# Patient Record
Sex: Female | Born: 1950 | Race: Black or African American | Hispanic: No | Marital: Single | Smoking: Former smoker
Health system: Southern US, Community
[De-identification: ages and names within clinical notes are randomized; demographics above are authoritative.]

## PROBLEM LIST (undated history)

## (undated) DIAGNOSIS — F419 Anxiety disorder, unspecified: Secondary | ICD-10-CM

## (undated) DIAGNOSIS — M1990 Unspecified osteoarthritis, unspecified site: Secondary | ICD-10-CM

## (undated) DIAGNOSIS — K219 Gastro-esophageal reflux disease without esophagitis: Secondary | ICD-10-CM

## (undated) DIAGNOSIS — E119 Type 2 diabetes mellitus without complications: Secondary | ICD-10-CM

## (undated) DIAGNOSIS — R51 Headache: Secondary | ICD-10-CM

## (undated) DIAGNOSIS — I8290 Acute embolism and thrombosis of unspecified vein: Secondary | ICD-10-CM

## (undated) DIAGNOSIS — G629 Polyneuropathy, unspecified: Secondary | ICD-10-CM

## (undated) DIAGNOSIS — Z8719 Personal history of other diseases of the digestive system: Secondary | ICD-10-CM

## (undated) DIAGNOSIS — I499 Cardiac arrhythmia, unspecified: Secondary | ICD-10-CM

## (undated) DIAGNOSIS — C801 Malignant (primary) neoplasm, unspecified: Secondary | ICD-10-CM

## (undated) DIAGNOSIS — I10 Essential (primary) hypertension: Secondary | ICD-10-CM

## (undated) DIAGNOSIS — R519 Headache, unspecified: Secondary | ICD-10-CM

## (undated) DIAGNOSIS — K649 Unspecified hemorrhoids: Secondary | ICD-10-CM

## (undated) DIAGNOSIS — K5900 Constipation, unspecified: Secondary | ICD-10-CM

## (undated) DIAGNOSIS — D649 Anemia, unspecified: Secondary | ICD-10-CM

## (undated) DIAGNOSIS — E039 Hypothyroidism, unspecified: Secondary | ICD-10-CM

## (undated) HISTORY — DX: Polyneuropathy, unspecified: G62.9

## (undated) HISTORY — PX: OTHER SURGICAL HISTORY: SHX169

## (undated) HISTORY — PX: BACK SURGERY: SHX140

## (undated) HISTORY — PX: ABDOMINAL HYSTERECTOMY: SHX81

## (undated) HISTORY — PX: NASAL SINUS SURGERY: SHX719

## (undated) HISTORY — PX: COLONOSCOPY: SHX174

## (undated) HISTORY — PX: FOOT SURGERY: SHX648

## (undated) HISTORY — PX: CERVIX REMOVAL: SHX592

## (undated) HISTORY — PX: TONSILLECTOMY: SUR1361

---

## 2013-10-22 DIAGNOSIS — M797 Fibromyalgia: Secondary | ICD-10-CM

## 2013-10-22 DIAGNOSIS — M069 Rheumatoid arthritis, unspecified: Secondary | ICD-10-CM

## 2013-10-22 HISTORY — DX: Fibromyalgia: M79.7

## 2013-10-22 HISTORY — DX: Rheumatoid arthritis, unspecified: M06.9

## 2015-10-23 DIAGNOSIS — M722 Plantar fascial fibromatosis: Secondary | ICD-10-CM

## 2015-10-23 HISTORY — DX: Plantar fascial fibromatosis: M72.2

## 2015-11-24 ENCOUNTER — Other Ambulatory Visit: Payer: Self-pay | Admitting: Neurosurgery

## 2015-11-24 DIAGNOSIS — M4316 Spondylolisthesis, lumbar region: Secondary | ICD-10-CM

## 2015-12-02 ENCOUNTER — Other Ambulatory Visit: Payer: Self-pay

## 2015-12-06 ENCOUNTER — Inpatient Hospital Stay: Admission: RE | Admit: 2015-12-06 | Payer: Self-pay | Source: Ambulatory Visit

## 2015-12-06 ENCOUNTER — Other Ambulatory Visit: Payer: Self-pay | Admitting: Neurosurgery

## 2015-12-06 DIAGNOSIS — M4316 Spondylolisthesis, lumbar region: Secondary | ICD-10-CM

## 2015-12-09 ENCOUNTER — Ambulatory Visit
Admission: RE | Admit: 2015-12-09 | Discharge: 2015-12-09 | Disposition: A | Discharge status: Home / Self Care | Payer: BLUE CROSS/BLUE SHIELD | Source: Ambulatory Visit | Attending: Neurosurgery | Admitting: Neurosurgery

## 2015-12-09 DIAGNOSIS — M4316 Spondylolisthesis, lumbar region: Secondary | ICD-10-CM

## 2015-12-09 MED ORDER — METHYLPREDNISOLONE ACETATE 40 MG/ML INJ SUSP (RADIOLOG
120.0000 mg | Freq: Once | INTRAMUSCULAR | Status: AC
  Administered 2015-12-09: 120 mg via EPIDURAL

## 2015-12-09 MED ORDER — IOHEXOL 180 MG/ML  SOLN
1.0000 mL | Freq: Once | INTRAMUSCULAR | Status: AC | PRN
  Administered 2015-12-09: 1 mL via EPIDURAL

## 2015-12-09 NOTE — Discharge Instructions (Signed)

## 2015-12-21 ENCOUNTER — Other Ambulatory Visit: Payer: Self-pay | Admitting: Neurosurgery

## 2015-12-21 DIAGNOSIS — M4316 Spondylolisthesis, lumbar region: Secondary | ICD-10-CM

## 2015-12-27 ENCOUNTER — Ambulatory Visit
Admission: RE | Admit: 2015-12-27 | Discharge: 2015-12-27 | Disposition: A | Discharge status: Home / Self Care | Payer: Medicare Other | Source: Ambulatory Visit | Attending: Neurosurgery | Admitting: Neurosurgery

## 2015-12-27 DIAGNOSIS — M4316 Spondylolisthesis, lumbar region: Secondary | ICD-10-CM

## 2015-12-27 MED ORDER — METHYLPREDNISOLONE ACETATE 40 MG/ML INJ SUSP (RADIOLOG
120.0000 mg | Freq: Once | INTRAMUSCULAR | Status: AC
  Administered 2015-12-27: 120 mg via EPIDURAL

## 2015-12-27 MED ORDER — IOHEXOL 180 MG/ML  SOLN
1.0000 mL | Freq: Once | INTRAMUSCULAR | Status: AC | PRN
  Administered 2015-12-27: 1 mL via EPIDURAL

## 2015-12-27 NOTE — Discharge Instructions (Signed)

## 2016-01-11 ENCOUNTER — Other Ambulatory Visit: Payer: Self-pay | Admitting: Neurosurgery

## 2016-01-11 DIAGNOSIS — M4316 Spondylolisthesis, lumbar region: Secondary | ICD-10-CM

## 2016-01-16 ENCOUNTER — Other Ambulatory Visit: Payer: BLUE CROSS/BLUE SHIELD

## 2016-01-30 ENCOUNTER — Ambulatory Visit
Admission: RE | Admit: 2016-01-30 | Discharge: 2016-01-30 | Disposition: A | Discharge status: Home / Self Care | Payer: BLUE CROSS/BLUE SHIELD | Source: Ambulatory Visit | Attending: Neurosurgery | Admitting: Neurosurgery

## 2016-01-30 ENCOUNTER — Ambulatory Visit
Admission: RE | Admit: 2016-01-30 | Discharge: 2016-01-30 | Disposition: A | Discharge status: Home / Self Care | Payer: Medicare Other | Source: Ambulatory Visit | Attending: Neurosurgery | Admitting: Neurosurgery

## 2016-01-30 DIAGNOSIS — M4316 Spondylolisthesis, lumbar region: Secondary | ICD-10-CM

## 2016-01-30 MED ORDER — ONDANSETRON HCL 4 MG/2ML IJ SOLN
4.0000 mg | Freq: Once | INTRAMUSCULAR | Status: AC
  Administered 2016-01-30: 4 mg via INTRAMUSCULAR

## 2016-01-30 MED ORDER — DIAZEPAM 5 MG PO TABS
5.0000 mg | ORAL_TABLET | Freq: Once | ORAL | Status: AC
  Administered 2016-01-30: 5 mg via ORAL

## 2016-01-30 MED ORDER — IOPAMIDOL (ISOVUE-M 200) INJECTION 41%
15.0000 mL | Freq: Once | INTRAMUSCULAR | Status: AC
  Administered 2016-01-30: 15 mL via INTRATHECAL

## 2016-01-30 MED ORDER — ONDANSETRON HCL 4 MG/2ML IJ SOLN: 4.0000 mg | Freq: Four times a day (QID) | INTRAMUSCULAR | Status: DC | PRN

## 2016-01-30 MED ORDER — MEPERIDINE HCL 100 MG/ML IJ SOLN
75.0000 mg | Freq: Once | INTRAMUSCULAR | Status: AC
  Administered 2016-01-30: 75 mg via INTRAMUSCULAR

## 2016-01-30 NOTE — Discharge Instructions (Signed)

## 2016-03-06 ENCOUNTER — Other Ambulatory Visit: Payer: Self-pay | Admitting: Neurological Surgery

## 2016-03-16 NOTE — Pre-Procedure Instructions (Signed)
    Jalyiah Woodrick  03/16/2016   Your procedure is scheduled on Tuesday, June 6.  Report to [facility] Admitting at 5:30 A.M.               Your surgery or procedure is scheduled for 7:30 AM   Call this number if you have problems the morning of [phone]                 For any other questions, please call [phone], Monday - Friday 8 AM - 4 PM.   Remember:  Do not eat food or drink liquids after midnight Monday June 5.  Take these medicines the morning of surgery with A SIP OF WATER: CRESTOR, gabapentin (NEURONTIN).                Take if needed: HYDROcodone-acetaminophen (NORCO/VICODIN).                   STOP taking Meloxicam (Mobic), Herbal Medications, Vitamins.  Do not take Aspirin or Aspirin Products , Ibuprofen (Advil), Naproxen (Aleve).   Do not wear jewelry, make-up or nail polish.  Do not wear lotions, powders, or perfumes.    Do not shave 48 hours prior to surgery.    Do not bring valuables to the hospital.  [facility] is not responsible for any belongings or valuables.  Contacts, dentures or bridgework may not be worn into surgery.  Leave your suitcase in the car.  After surgery it may be brought to your room.  For patients admitted to the hospital, discharge time will be determined by your treatment team.   Please read over the following fact sheets that you were given. [facility]- Preparing For Surgery and Patient Instructions for Mupirocin Application

## 2016-03-20 ENCOUNTER — Encounter (HOSPITAL_COMMUNITY)
Admission: RE | Admit: 2016-03-20 | Discharge: 2016-03-20 | Disposition: A | Discharge status: Home / Self Care | Payer: BLUE CROSS/BLUE SHIELD | Source: Ambulatory Visit | Attending: Neurological Surgery | Admitting: Neurological Surgery

## 2016-03-20 ENCOUNTER — Encounter (HOSPITAL_COMMUNITY): Payer: Self-pay

## 2016-03-20 DIAGNOSIS — Z01812 Encounter for preprocedural laboratory examination: Secondary | ICD-10-CM | POA: Insufficient documentation

## 2016-03-20 DIAGNOSIS — Z01818 Encounter for other preprocedural examination: Secondary | ICD-10-CM | POA: Diagnosis present

## 2016-03-20 DIAGNOSIS — I10 Essential (primary) hypertension: Secondary | ICD-10-CM | POA: Diagnosis not present

## 2016-03-20 DIAGNOSIS — Z0183 Encounter for blood typing: Secondary | ICD-10-CM | POA: Diagnosis not present

## 2016-03-20 DIAGNOSIS — M4316 Spondylolisthesis, lumbar region: Secondary | ICD-10-CM | POA: Diagnosis not present

## 2016-03-20 HISTORY — DX: Type 2 diabetes mellitus without complications: E11.9

## 2016-03-20 HISTORY — DX: Unspecified osteoarthritis, unspecified site: M19.90

## 2016-03-20 HISTORY — DX: Unspecified hemorrhoids: K64.9

## 2016-03-20 HISTORY — DX: Personal history of other diseases of the digestive system: Z87.19

## 2016-03-20 HISTORY — DX: Anemia, unspecified: D64.9

## 2016-03-20 HISTORY — DX: Essential (primary) hypertension: I10

## 2016-03-20 HISTORY — DX: Cardiac arrhythmia, unspecified: I49.9

## 2016-03-20 HISTORY — DX: Gastro-esophageal reflux disease without esophagitis: K21.9

## 2016-03-20 HISTORY — DX: Hypothyroidism, unspecified: E03.9

## 2016-03-20 HISTORY — DX: Constipation, unspecified: K59.00

## 2016-03-20 LAB — CBC
HCT: 43.5 % (ref 36.0–46.0)
Hemoglobin: 13.8 g/dL (ref 12.0–15.0)
MCH: 27.8 pg (ref 26.0–34.0)
MCHC: 31.7 g/dL (ref 30.0–36.0)
MCV: 87.7 fL (ref 78.0–100.0)
Platelets: 251 10*3/uL (ref 150–400)
RBC: 4.96 MIL/uL (ref 3.87–5.11)
RDW: 14.2 % (ref 11.5–15.5)
WBC: 6.2 10*3/uL (ref 4.0–10.5)

## 2016-03-20 LAB — BASIC METABOLIC PANEL
Anion gap: 6 (ref 5–15)
BUN: 13 mg/dL (ref 6–20)
CO2: 26 mmol/L (ref 22–32)
Calcium: 9.4 mg/dL (ref 8.9–10.3)
Chloride: 106 mmol/L (ref 101–111)
Creatinine, Ser: 0.9 mg/dL (ref 0.44–1.00)
GFR calc Af Amer: >60 mL/min (ref >60)
GFR calc non Af Amer: >60 mL/min (ref >60)
Glucose, Bld: 102 mg/dL — ABNORMAL HIGH (ref 65–99)
Potassium: 3.7 mmol/L (ref 3.5–5.1)
Sodium: 138 mmol/L (ref 135–145)

## 2016-03-20 LAB — TYPE AND SCREEN
ABO/RH(D): A POS
Antibody Screen: NEGATIVE

## 2016-03-20 LAB — SURGICAL PCR SCREEN
MRSA, PCR: NEGATIVE
Staphylococcus aureus: NEGATIVE

## 2016-03-20 LAB — ABO/RH: ABO/RH(D): A POS

## 2016-03-20 LAB — GLUCOSE, CAPILLARY: Glucose-Capillary: 102 mg/dL — ABNORMAL HIGH (ref 65–99)

## 2016-03-20 NOTE — Progress Notes (Signed)
Sheri Baker reported that she is a type II diabetic. Patient states that she does not check CBGs, does not have a machine.  "Sheri Baker had put me on Metformin and I stopped it."  "I started it back 3 days prior to seeing Sheri Baker, he told me to stop it for my bones." PCP is not aware that patient is not taking medication."  Patient denies chest pain or shortness of breath.  Patient reports that she had a cardiac work up- ? 8 years ago, but everything was normal and she did not need to follow up with a cardiologist. I requester records from Sheri Baker- [facility] in [place].  I also requested records from [facility].

## 2016-03-21 LAB — HEMOGLOBIN A1C
Hgb A1c MFr Bld: 6.2 % — ABNORMAL HIGH (ref 4.8–5.6)
Mean Plasma Glucose: 131 mg/dL

## 2016-03-26 MED ORDER — VANCOMYCIN HCL IN DEXTROSE 1-5 GM/200ML-% IV SOLN
1000.0000 mg | INTRAVENOUS | Status: AC
Start: 2016-03-27 — End: 2016-03-27
  Administered 2016-03-27: 1000 mg via INTRAVENOUS
  Filled 2016-03-26: qty 200

## 2016-03-26 NOTE — Anesthesia Preprocedure Evaluation (Addendum)
Anesthesia Evaluation  Patient identified by MRN, date of birth, ID band Patient awake    Reviewed: Allergy & Precautions, NPO status , Patient's Chart, lab work & pertinent test results  History of Anesthesia Complications Negative for: history of anesthetic complications  Airway Mallampati: II  TM Distance: >3 FB Neck ROM: Full    Dental  (+) Chipped, Dental Advisory Given, Missing, Teeth Intact   Pulmonary former smoker,    breath sounds clear to auscultation       Cardiovascular hypertension, Pt. on medications (-) angina Rhythm:Regular Rate:Normal  '11 cath: normal coronaries, normal LVF   Neuro/Psych Chronic back pain: narcotics    GI/Hepatic Neg liver ROS, hiatal hernia, GERD  Medicated and Controlled,  Endo/Other  diabetes (diet controlledglu 109), Well Controlled, Type 2Hypothyroidism Morbid obesity  Renal/GU negative Renal ROS     Musculoskeletal  (+) Arthritis , Osteoarthritis,    Abdominal (+) + obese,   Peds  Hematology negative hematology ROS (+)   Anesthesia Other Findings   Reproductive/Obstetrics                         Anesthesia Physical Anesthesia Plan  ASA: III  Anesthesia Plan: General   Post-op Pain Management:    Induction: Intravenous  Airway Management Planned: Oral ETT  Additional Equipment:   Intra-op Plan:   Post-operative Plan: Extubation in OR  Informed Consent: I have reviewed the patients History and Physical, chart, labs and discussed the procedure including the risks, benefits and alternatives for the proposed anesthesia with the patient or authorized representative who has indicated his/her understanding and acceptance.   Dental advisory given  Plan Discussed with: CRNA, Surgeon and Anesthesiologist  Anesthesia Plan Comments: (Plan routine monitors, GETA)       Anesthesia Quick Evaluation

## 2016-03-27 ENCOUNTER — Encounter (HOSPITAL_COMMUNITY)
Admission: RE | Disposition: A | Discharge status: Rehabilitation Facility | Payer: Self-pay | Source: Ambulatory Visit | Attending: Neurological Surgery

## 2016-03-27 ENCOUNTER — Inpatient Hospital Stay (HOSPITAL_COMMUNITY)
Admission: RE | Admit: 2016-03-27 | Discharge: 2016-03-30 | DRG: 460 | Disposition: A | Discharge status: Rehabilitation Facility | Payer: BLUE CROSS/BLUE SHIELD | Source: Ambulatory Visit | Attending: Neurological Surgery | Admitting: Neurological Surgery

## 2016-03-27 ENCOUNTER — Inpatient Hospital Stay (HOSPITAL_COMMUNITY): Payer: BLUE CROSS/BLUE SHIELD | Admitting: Emergency Medicine

## 2016-03-27 ENCOUNTER — Encounter (HOSPITAL_COMMUNITY): Payer: Self-pay | Admitting: *Deleted

## 2016-03-27 ENCOUNTER — Inpatient Hospital Stay (HOSPITAL_COMMUNITY): Payer: BLUE CROSS/BLUE SHIELD | Admitting: Anesthesiology

## 2016-03-27 ENCOUNTER — Inpatient Hospital Stay (HOSPITAL_COMMUNITY): Payer: BLUE CROSS/BLUE SHIELD

## 2016-03-27 DIAGNOSIS — Z886 Allergy status to analgesic agent status: Secondary | ICD-10-CM

## 2016-03-27 DIAGNOSIS — K219 Gastro-esophageal reflux disease without esophagitis: Secondary | ICD-10-CM | POA: Diagnosis present

## 2016-03-27 DIAGNOSIS — Z885 Allergy status to narcotic agent status: Secondary | ICD-10-CM

## 2016-03-27 DIAGNOSIS — M4726 Other spondylosis with radiculopathy, lumbar region: Secondary | ICD-10-CM | POA: Diagnosis present

## 2016-03-27 DIAGNOSIS — E039 Hypothyroidism, unspecified: Secondary | ICD-10-CM | POA: Diagnosis present

## 2016-03-27 DIAGNOSIS — D62 Acute posthemorrhagic anemia: Secondary | ICD-10-CM | POA: Diagnosis not present

## 2016-03-27 DIAGNOSIS — M4806 Spinal stenosis, lumbar region: Secondary | ICD-10-CM | POA: Diagnosis present

## 2016-03-27 DIAGNOSIS — K449 Diaphragmatic hernia without obstruction or gangrene: Secondary | ICD-10-CM | POA: Diagnosis present

## 2016-03-27 DIAGNOSIS — Z419 Encounter for procedure for purposes other than remedying health state, unspecified: Secondary | ICD-10-CM

## 2016-03-27 DIAGNOSIS — G8929 Other chronic pain: Secondary | ICD-10-CM | POA: Diagnosis present

## 2016-03-27 DIAGNOSIS — Z87891 Personal history of nicotine dependence: Secondary | ICD-10-CM | POA: Diagnosis not present

## 2016-03-27 DIAGNOSIS — K5901 Slow transit constipation: Secondary | ICD-10-CM | POA: Diagnosis present

## 2016-03-27 DIAGNOSIS — I10 Essential (primary) hypertension: Secondary | ICD-10-CM | POA: Diagnosis present

## 2016-03-27 DIAGNOSIS — Z79899 Other long term (current) drug therapy: Secondary | ICD-10-CM | POA: Diagnosis not present

## 2016-03-27 DIAGNOSIS — E871 Hypo-osmolality and hyponatremia: Secondary | ICD-10-CM | POA: Diagnosis present

## 2016-03-27 DIAGNOSIS — Z23 Encounter for immunization: Secondary | ICD-10-CM | POA: Diagnosis not present

## 2016-03-27 DIAGNOSIS — R2689 Other abnormalities of gait and mobility: Secondary | ICD-10-CM | POA: Diagnosis present

## 2016-03-27 DIAGNOSIS — Z6841 Body Mass Index (BMI) 40.0 and over, adult: Secondary | ICD-10-CM | POA: Diagnosis not present

## 2016-03-27 DIAGNOSIS — M5416 Radiculopathy, lumbar region: Secondary | ICD-10-CM | POA: Diagnosis not present

## 2016-03-27 DIAGNOSIS — M4807 Spinal stenosis, lumbosacral region: Secondary | ICD-10-CM | POA: Diagnosis present

## 2016-03-27 DIAGNOSIS — R079 Chest pain, unspecified: Secondary | ICD-10-CM | POA: Diagnosis not present

## 2016-03-27 DIAGNOSIS — I2699 Other pulmonary embolism without acute cor pulmonale: Secondary | ICD-10-CM | POA: Diagnosis present

## 2016-03-27 DIAGNOSIS — T402X5A Adverse effect of other opioids, initial encounter: Secondary | ICD-10-CM | POA: Diagnosis not present

## 2016-03-27 DIAGNOSIS — F419 Anxiety disorder, unspecified: Secondary | ICD-10-CM | POA: Diagnosis present

## 2016-03-27 DIAGNOSIS — Z880 Allergy status to penicillin: Secondary | ICD-10-CM | POA: Diagnosis not present

## 2016-03-27 DIAGNOSIS — E119 Type 2 diabetes mellitus without complications: Secondary | ICD-10-CM | POA: Diagnosis present

## 2016-03-27 DIAGNOSIS — R002 Palpitations: Secondary | ICD-10-CM | POA: Diagnosis not present

## 2016-03-27 DIAGNOSIS — G5791 Unspecified mononeuropathy of right lower limb: Secondary | ICD-10-CM | POA: Diagnosis not present

## 2016-03-27 DIAGNOSIS — R071 Chest pain on breathing: Secondary | ICD-10-CM | POA: Diagnosis not present

## 2016-03-27 DIAGNOSIS — D72829 Elevated white blood cell count, unspecified: Secondary | ICD-10-CM | POA: Diagnosis present

## 2016-03-27 DIAGNOSIS — D696 Thrombocytopenia, unspecified: Secondary | ICD-10-CM | POA: Diagnosis not present

## 2016-03-27 DIAGNOSIS — M4316 Spondylolisthesis, lumbar region: Secondary | ICD-10-CM | POA: Diagnosis present

## 2016-03-27 DIAGNOSIS — R52 Pain, unspecified: Secondary | ICD-10-CM | POA: Diagnosis not present

## 2016-03-27 DIAGNOSIS — F411 Generalized anxiety disorder: Secondary | ICD-10-CM | POA: Diagnosis not present

## 2016-03-27 HISTORY — DX: Headache, unspecified: R51.9

## 2016-03-27 HISTORY — DX: Headache: R51

## 2016-03-27 LAB — GLUCOSE, CAPILLARY
Glucose-Capillary: 109 mg/dL — ABNORMAL HIGH (ref 65–99)
Glucose-Capillary: 141 mg/dL — ABNORMAL HIGH (ref 65–99)
Glucose-Capillary: 134 mg/dL — ABNORMAL HIGH (ref 65–99)
Glucose-Capillary: 154 mg/dL — ABNORMAL HIGH (ref 65–99)

## 2016-03-27 SURGERY — POSTERIOR LUMBAR FUSION 2 LEVEL
Anesthesia: General | Site: Spine Lumbar

## 2016-03-27 MED ORDER — HYDROMORPHONE HCL 1 MG/ML IJ SOLN
INTRAMUSCULAR | Status: AC
  Filled 2016-03-27: qty 1

## 2016-03-27 MED ORDER — ALUM & MAG HYDROXIDE-SIMETH 200-200-20 MG/5ML PO SUSP: 30.0000 mL | Freq: Four times a day (QID) | ORAL | Status: DC | PRN

## 2016-03-27 MED ORDER — SODIUM CHLORIDE 0.9 % IV SOLN
INTRAVENOUS | Status: DC | PRN
  Administered 2016-03-27: 14:00:00 via INTRAVENOUS

## 2016-03-27 MED ORDER — ACETAMINOPHEN 325 MG PO TABS
650.0000 mg | ORAL_TABLET | ORAL | Status: DC | PRN
  Filled 2016-03-27: qty 2

## 2016-03-27 MED ORDER — POLYETHYLENE GLYCOL 3350 17 G PO PACK: 17.0000 g | PACK | Freq: Every day | ORAL | Status: DC | PRN

## 2016-03-27 MED ORDER — SPIRONOLACTONE 25 MG PO TABS
25.0000 mg | ORAL_TABLET | Freq: Every day | ORAL | Status: DC
  Administered 2016-03-28: 25 mg via ORAL
  Filled 2016-03-27 (×3): qty 1

## 2016-03-27 MED ORDER — MIDAZOLAM HCL 5 MG/5ML IJ SOLN
INTRAMUSCULAR | Status: DC | PRN
  Administered 2016-03-27: 2 mg via INTRAVENOUS

## 2016-03-27 MED ORDER — SODIUM CHLORIDE 0.9 % IV SOLN
INTRAVENOUS | Status: DC
  Administered 2016-03-27 – 2016-03-28 (×2): via INTRAVENOUS

## 2016-03-27 MED ORDER — ALBUMIN HUMAN 5 % IV SOLN
INTRAVENOUS | Status: DC | PRN
Start: 2016-03-27 — End: 2016-03-27
  Administered 2016-03-27: 12:00:00 via INTRAVENOUS

## 2016-03-27 MED ORDER — LIDOCAINE-EPINEPHRINE 1 %-1:100000 IJ SOLN
INTRAMUSCULAR | Status: DC | PRN
  Administered 2016-03-27: 5 mL

## 2016-03-27 MED ORDER — THYROID 30 MG PO TABS: 15.0000 mg | ORAL_TABLET | Freq: Every day | ORAL | Status: DC

## 2016-03-27 MED ORDER — SODIUM CHLORIDE 0.9 % IR SOLN
Status: DC | PRN
  Administered 2016-03-27: 500 mL

## 2016-03-27 MED ORDER — ROCURONIUM BROMIDE 100 MG/10ML IV SOLN
INTRAVENOUS | Status: DC | PRN
  Administered 2016-03-27: 50 mg via INTRAVENOUS
  Administered 2016-03-27: 10 mg via INTRAVENOUS
  Administered 2016-03-27: 20 mg via INTRAVENOUS
  Administered 2016-03-27 (×5): 10 mg via INTRAVENOUS

## 2016-03-27 MED ORDER — THYROID 16.25 MG PO TABS: 16.2500 mg | ORAL_TABLET | Freq: Every day | ORAL | Status: DC

## 2016-03-27 MED ORDER — SODIUM CHLORIDE 0.9 % IV SOLN
250.0000 mL | INTRAVENOUS | Status: DC
  Administered 2016-03-27: 250 mL via INTRAVENOUS

## 2016-03-27 MED ORDER — MAGNESIUM CITRATE PO SOLN: 1.0000 | Freq: Once | ORAL | Status: DC | PRN

## 2016-03-27 MED ORDER — OMEPRAZOLE MAGNESIUM 20 MG PO TBEC: 20.0000 mg | DELAYED_RELEASE_TABLET | Freq: Every day | ORAL | Status: DC

## 2016-03-27 MED ORDER — KETOROLAC TROMETHAMINE 15 MG/ML IJ SOLN
15.0000 mg | Freq: Four times a day (QID) | INTRAMUSCULAR | Status: AC
  Administered 2016-03-27 – 2016-03-28 (×4): 15 mg via INTRAVENOUS
  Filled 2016-03-27 (×4): qty 1

## 2016-03-27 MED ORDER — BUPIVACAINE HCL (PF) 0.5 % IJ SOLN
INTRAMUSCULAR | Status: DC | PRN
  Administered 2016-03-27: 22 mL
  Administered 2016-03-27: 5 mL

## 2016-03-27 MED ORDER — MORPHINE SULFATE (PF) 2 MG/ML IV SOLN
1.0000 mg | INTRAVENOUS | Status: DC | PRN
  Administered 2016-03-27 – 2016-03-28 (×3): 2 mg via INTRAVENOUS
  Administered 2016-03-28: 4 mg via INTRAVENOUS
  Administered 2016-03-28: 3 mg via INTRAVENOUS
  Filled 2016-03-27: qty 2
  Filled 2016-03-27 (×3): qty 1
  Filled 2016-03-27: qty 2
  Filled 2016-03-27: qty 1

## 2016-03-27 MED ORDER — MIDAZOLAM HCL 2 MG/2ML IJ SOLN: 0.5000 mg | Freq: Once | INTRAMUSCULAR | Status: DC | PRN

## 2016-03-27 MED ORDER — HYDROCHLOROTHIAZIDE 25 MG PO TABS
25.0000 mg | ORAL_TABLET | Freq: Every day | ORAL | Status: DC
  Administered 2016-03-28: 25 mg via ORAL
  Filled 2016-03-27 (×3): qty 1

## 2016-03-27 MED ORDER — MEPERIDINE HCL 25 MG/ML IJ SOLN: 6.2500 mg | INTRAMUSCULAR | Status: DC | PRN

## 2016-03-27 MED ORDER — SODIUM CHLORIDE 0.9% FLUSH
3.0000 mL | Freq: Two times a day (BID) | INTRAVENOUS | Status: DC
  Administered 2016-03-27 – 2016-03-29 (×4): 3 mL via INTRAVENOUS

## 2016-03-27 MED ORDER — 0.9 % SODIUM CHLORIDE (POUR BTL) OPTIME
TOPICAL | Status: DC | PRN
  Administered 2016-03-27: 1000 mL

## 2016-03-27 MED ORDER — HYDROCODONE-ACETAMINOPHEN 5-325 MG PO TABS: 1.0000 | ORAL_TABLET | ORAL | Status: DC | PRN

## 2016-03-27 MED ORDER — MENTHOL 3 MG MT LOZG
1.0000 | LOZENGE | OROMUCOSAL | Status: DC | PRN
Start: 2016-03-27 — End: 2016-03-30

## 2016-03-27 MED ORDER — PANTOPRAZOLE SODIUM 40 MG PO TBEC
40.0000 mg | DELAYED_RELEASE_TABLET | Freq: Every day | ORAL | Status: DC
  Administered 2016-03-27 – 2016-03-29 (×3): 40 mg via ORAL
  Filled 2016-03-27 (×4): qty 1

## 2016-03-27 MED ORDER — HYDROMORPHONE HCL 1 MG/ML IJ SOLN
0.2500 mg | INTRAMUSCULAR | Status: DC | PRN
Start: 2016-03-27 — End: 2016-03-27
  Administered 2016-03-27 (×4): 0.5 mg via INTRAVENOUS

## 2016-03-27 MED ORDER — PHENOL 1.4 % MT LIQD
1.0000 | OROMUCOSAL | Status: DC | PRN
Start: 2016-03-27 — End: 2016-03-30
  Filled 2016-03-27: qty 177

## 2016-03-27 MED ORDER — THROMBIN 5000 UNITS EX SOLR
CUTANEOUS | Status: DC | PRN
  Administered 2016-03-27 (×3): 5 mL via TOPICAL

## 2016-03-27 MED ORDER — HYDROMORPHONE HCL 1 MG/ML IJ SOLN
INTRAMUSCULAR | Status: AC
Start: 2016-03-27 — End: 2016-03-28
  Filled 2016-03-27: qty 1

## 2016-03-27 MED ORDER — THROMBIN 20000 UNITS EX SOLR
CUTANEOUS | Status: DC | PRN
  Administered 2016-03-27: 20 mL via TOPICAL

## 2016-03-27 MED ORDER — SUGAMMADEX SODIUM 200 MG/2ML IV SOLN
INTRAVENOUS | Status: DC | PRN
  Administered 2016-03-27: 200 mg via INTRAVENOUS

## 2016-03-27 MED ORDER — CEFAZOLIN SODIUM 1-5 GM-% IV SOLN: 1.0000 g | Freq: Three times a day (TID) | INTRAVENOUS | Status: DC

## 2016-03-27 MED ORDER — FENTANYL CITRATE (PF) 250 MCG/5ML IJ SOLN
INTRAMUSCULAR | Status: AC
  Filled 2016-03-27: qty 5

## 2016-03-27 MED ORDER — SENNA 8.6 MG PO TABS
1.0000 | ORAL_TABLET | Freq: Two times a day (BID) | ORAL | Status: DC
  Administered 2016-03-27 – 2016-03-30 (×6): 8.6 mg via ORAL
  Filled 2016-03-27 (×6): qty 1

## 2016-03-27 MED ORDER — HYDROCODONE-ACETAMINOPHEN 5-325 MG PO TABS
1.0000 | ORAL_TABLET | Freq: Four times a day (QID) | ORAL | Status: DC | PRN
  Administered 2016-03-27 – 2016-03-30 (×3): 1 via ORAL
  Filled 2016-03-27 (×3): qty 1

## 2016-03-27 MED ORDER — DEXAMETHASONE SODIUM PHOSPHATE 10 MG/ML IJ SOLN
INTRAMUSCULAR | Status: DC | PRN
  Administered 2016-03-27: 10 mg via INTRAVENOUS

## 2016-03-27 MED ORDER — DOCUSATE SODIUM 100 MG PO CAPS
100.0000 mg | ORAL_CAPSULE | Freq: Two times a day (BID) | ORAL | Status: DC
  Administered 2016-03-27 – 2016-03-30 (×6): 100 mg via ORAL
  Filled 2016-03-27 (×6): qty 1

## 2016-03-27 MED ORDER — LACTATED RINGERS IV SOLN
INTRAVENOUS | Status: DC | PRN
  Administered 2016-03-27 (×4): via INTRAVENOUS

## 2016-03-27 MED ORDER — ACETAMINOPHEN 650 MG RE SUPP: 650.0000 mg | RECTAL | Status: DC | PRN

## 2016-03-27 MED ORDER — ROSUVASTATIN CALCIUM 20 MG PO TABS
20.0000 mg | ORAL_TABLET | Freq: Every day | ORAL | Status: DC
  Administered 2016-03-27 – 2016-03-29 (×3): 20 mg via ORAL
  Filled 2016-03-27 (×4): qty 1

## 2016-03-27 MED ORDER — MIDAZOLAM HCL 2 MG/2ML IJ SOLN
INTRAMUSCULAR | Status: AC
  Filled 2016-03-27: qty 2

## 2016-03-27 MED ORDER — PROMETHAZINE HCL 25 MG/ML IJ SOLN: 6.2500 mg | INTRAMUSCULAR | Status: DC | PRN

## 2016-03-27 MED ORDER — ONDANSETRON HCL 4 MG/2ML IJ SOLN
INTRAMUSCULAR | Status: DC | PRN
  Administered 2016-03-27: 4 mg via INTRAVENOUS

## 2016-03-27 MED ORDER — PNEUMOCOCCAL VAC POLYVALENT 25 MCG/0.5ML IJ INJ
0.5000 mL | INJECTION | INTRAMUSCULAR | Status: AC
  Administered 2016-03-28: 0.5 mL via INTRAMUSCULAR
  Filled 2016-03-27: qty 0.5

## 2016-03-27 MED ORDER — METHOCARBAMOL 1000 MG/10ML IJ SOLN: 500.0000 mg | Freq: Four times a day (QID) | INTRAVENOUS | Status: DC | PRN

## 2016-03-27 MED ORDER — FENTANYL CITRATE (PF) 100 MCG/2ML IJ SOLN
INTRAMUSCULAR | Status: DC | PRN
  Administered 2016-03-27: 50 ug via INTRAVENOUS
  Administered 2016-03-27: 250 ug via INTRAVENOUS
  Administered 2016-03-27 (×6): 50 ug via INTRAVENOUS

## 2016-03-27 MED ORDER — LOSARTAN POTASSIUM 50 MG PO TABS
100.0000 mg | ORAL_TABLET | Freq: Every day | ORAL | Status: DC
  Administered 2016-03-28 – 2016-03-30 (×3): 100 mg via ORAL
  Filled 2016-03-27 (×4): qty 2

## 2016-03-27 MED ORDER — GABAPENTIN 300 MG PO CAPS
300.0000 mg | ORAL_CAPSULE | Freq: Three times a day (TID) | ORAL | Status: DC
  Administered 2016-03-27 – 2016-03-30 (×9): 300 mg via ORAL
  Filled 2016-03-27 (×10): qty 1

## 2016-03-27 MED ORDER — PROPOFOL 10 MG/ML IV BOLUS
INTRAVENOUS | Status: DC | PRN
  Administered 2016-03-27: 150 mg via INTRAVENOUS

## 2016-03-27 MED ORDER — BISACODYL 10 MG RE SUPP: 10.0000 mg | Freq: Every day | RECTAL | Status: DC | PRN

## 2016-03-27 MED ORDER — METHOCARBAMOL 500 MG PO TABS
500.0000 mg | ORAL_TABLET | Freq: Four times a day (QID) | ORAL | Status: DC | PRN
  Administered 2016-03-27 – 2016-03-30 (×5): 500 mg via ORAL
  Filled 2016-03-27 (×5): qty 1

## 2016-03-27 MED ORDER — SPIRONOLACTONE-HCTZ 25-25 MG PO TABS: 1.0000 | ORAL_TABLET | ORAL | Status: DC

## 2016-03-27 MED ORDER — PROPOFOL 10 MG/ML IV BOLUS
INTRAVENOUS | Status: AC
  Filled 2016-03-27: qty 20

## 2016-03-27 MED ORDER — BACLOFEN 10 MG PO TABS
10.0000 mg | ORAL_TABLET | Freq: Every day | ORAL | Status: DC | PRN
  Administered 2016-03-27: 10 mg via ORAL
  Filled 2016-03-27: qty 1

## 2016-03-27 MED ORDER — VANCOMYCIN HCL IN DEXTROSE 1-5 GM/200ML-% IV SOLN
1000.0000 mg | Freq: Two times a day (BID) | INTRAVENOUS | Status: AC
  Administered 2016-03-27 – 2016-03-28 (×2): 1000 mg via INTRAVENOUS
  Filled 2016-03-27 (×2): qty 200

## 2016-03-27 MED ORDER — ONDANSETRON HCL 4 MG/2ML IJ SOLN
4.0000 mg | INTRAMUSCULAR | Status: DC | PRN
  Administered 2016-03-27: 4 mg via INTRAVENOUS
  Filled 2016-03-27: qty 2

## 2016-03-27 MED ORDER — LIDOCAINE HCL (CARDIAC) 20 MG/ML IV SOLN
INTRAVENOUS | Status: DC | PRN
  Administered 2016-03-27: 20 mg via INTRAVENOUS

## 2016-03-27 MED ORDER — SODIUM CHLORIDE 0.9% FLUSH: 3.0000 mL | INTRAVENOUS | Status: DC | PRN

## 2016-03-27 MED ORDER — POTASSIUM GLUCONATE 595 (99 K) MG PO TABS: 1100.0000 mg | ORAL_TABLET | Freq: Every day | ORAL | Status: DC

## 2016-03-27 MED FILL — Sodium Chloride IV Soln 0.9%: INTRAVENOUS | Qty: 2000 | Status: AC

## 2016-03-27 MED FILL — Heparin Sodium (Porcine) Inj 1000 Unit/ML: INTRAMUSCULAR | Qty: 30 | Status: AC

## 2016-03-27 SURGICAL SUPPLY — 66 items
BAG DECANTER FOR FLEXI CONT (MISCELLANEOUS) ×2 IMPLANT
BLADE CLIPPER SURG (BLADE) IMPLANT
BUR MATCHSTICK NEURO 3.0 LAGG (BURR) ×2 IMPLANT
CANISTER SUCT 3000ML PPV (MISCELLANEOUS) ×2 IMPLANT
CONT SPEC 4OZ CLIKSEAL STRL BL (MISCELLANEOUS) ×2 IMPLANT
COVER BACK TABLE 60X90IN (DRAPES) ×2 IMPLANT
DECANTER SPIKE VIAL GLASS SM (MISCELLANEOUS) ×2 IMPLANT
DERMABOND ADHESIVE PROPEN (GAUZE/BANDAGES/DRESSINGS) ×1
DERMABOND ADVANCED (GAUZE/BANDAGES/DRESSINGS)
DERMABOND ADVANCED .7 DNX12 (GAUZE/BANDAGES/DRESSINGS) IMPLANT
DERMABOND ADVANCED .7 DNX6 (GAUZE/BANDAGES/DRESSINGS) ×1 IMPLANT
DEVICE DISSECT PLASMABLAD 3.0S (MISCELLANEOUS) ×1 IMPLANT
DRAPE C-ARM 42X72 X-RAY (DRAPES) ×4 IMPLANT
DRAPE LAPAROTOMY 100X72X124 (DRAPES) ×2 IMPLANT
DRAPE POUCH INSTRU U-SHP 10X18 (DRAPES) ×2 IMPLANT
DRAPE PROXIMA HALF (DRAPES) ×2 IMPLANT
DRSG OPSITE POSTOP 4X8 (GAUZE/BANDAGES/DRESSINGS) ×2 IMPLANT
DURAPREP 26ML APPLICATOR (WOUND CARE) ×2 IMPLANT
ELECT REM PT RETURN 9FT ADLT (ELECTROSURGICAL) ×2
ELECTRODE REM PT RTRN 9FT ADLT (ELECTROSURGICAL) ×1 IMPLANT
GAUZE SPONGE 4X4 12PLY STRL (GAUZE/BANDAGES/DRESSINGS) IMPLANT
GAUZE SPONGE 4X4 16PLY XRAY LF (GAUZE/BANDAGES/DRESSINGS) ×2 IMPLANT
GLOVE BIOGEL PI IND STRL 7.0 (GLOVE) ×3 IMPLANT
GLOVE BIOGEL PI IND STRL 8.5 (GLOVE) ×4 IMPLANT
GLOVE BIOGEL PI INDICATOR 7.0 (GLOVE) ×3
GLOVE BIOGEL PI INDICATOR 8.5 (GLOVE) ×4
GLOVE ECLIPSE 8.5 STRL (GLOVE) ×4 IMPLANT
GLOVE EXAM NITRILE LRG STRL (GLOVE) IMPLANT
GLOVE EXAM NITRILE MD LF STRL (GLOVE) IMPLANT
GLOVE EXAM NITRILE XL STR (GLOVE) IMPLANT
GLOVE EXAM NITRILE XS STR PU (GLOVE) IMPLANT
GLOVE SURG SS PI 6.5 STRL IVOR (GLOVE) ×10 IMPLANT
GOWN STRL REUS W/ TWL LRG LVL3 (GOWN DISPOSABLE) ×3 IMPLANT
GOWN STRL REUS W/ TWL XL LVL3 (GOWN DISPOSABLE) ×1 IMPLANT
GOWN STRL REUS W/TWL 2XL LVL3 (GOWN DISPOSABLE) ×4 IMPLANT
GOWN STRL REUS W/TWL LRG LVL3 (GOWN DISPOSABLE) ×3
GOWN STRL REUS W/TWL XL LVL3 (GOWN DISPOSABLE) ×1
HEMOSTAT POWDER KIT SURGIFOAM (HEMOSTASIS) ×4 IMPLANT
KIT BASIN OR (CUSTOM PROCEDURE TRAY) ×2 IMPLANT
KIT INFUSE MEDIUM (Orthopedic Implant) ×2 IMPLANT
KIT ROOM TURNOVER OR (KITS) ×2 IMPLANT
NEEDLE HYPO 22GX1.5 SAFETY (NEEDLE) ×2 IMPLANT
NEEDLE SPNL 18GX3.5 QUINCKE PK (NEEDLE) ×2 IMPLANT
NS IRRIG 1000ML POUR BTL (IV SOLUTION) ×2 IMPLANT
PACK LAMINECTOMY NEURO (CUSTOM PROCEDURE TRAY) ×2 IMPLANT
PAD ARMBOARD 7.5X6 YLW CONV (MISCELLANEOUS) ×10 IMPLANT
PATTIES SURGICAL .5 X1 (DISPOSABLE) ×2 IMPLANT
PLASMABLADE 3.0S (MISCELLANEOUS) ×2
ROD TI ALLOY CVD VIT 5.5X60MM (Rod) ×4 IMPLANT
SCREW VITALITY PA 6.5X45MM (Screw) ×12 IMPLANT
SPACER ZYSTON STR 10X25X10X8 (Spacer) ×4 IMPLANT
SPACER ZYSTON STR 11X25X10X8 (Spacer) ×4 IMPLANT
SPONGE LAP 4X18 X RAY DECT (DISPOSABLE) ×2 IMPLANT
SPONGE SURGIFOAM ABS GEL 100 (HEMOSTASIS) ×2 IMPLANT
SUT VIC AB 1 CT1 18XBRD ANBCTR (SUTURE) ×2 IMPLANT
SUT VIC AB 1 CT1 8-18 (SUTURE) ×2
SUT VIC AB 2-0 CP2 18 (SUTURE) ×4 IMPLANT
SUT VIC AB 3-0 SH 8-18 (SUTURE) ×4 IMPLANT
SYR 3ML LL SCALE MARK (SYRINGE) ×8 IMPLANT
SYR 5ML LL (SYRINGE) IMPLANT
TOP CLOSURE TORQ LIMIT (Neuro Prosthesis/Implant) ×12 IMPLANT
TOWEL OR 17X24 6PK STRL BLUE (TOWEL DISPOSABLE) ×4 IMPLANT
TOWEL OR 17X26 10 PK STRL BLUE (TOWEL DISPOSABLE) ×2 IMPLANT
TRAP SPECIMEN MUCOUS 40CC (MISCELLANEOUS) ×2 IMPLANT
TRAY FOLEY W/METER SILVER 16FR (SET/KITS/TRAYS/PACK) ×2 IMPLANT
WATER STERILE IRR 1000ML POUR (IV SOLUTION) ×2 IMPLANT

## 2016-03-27 NOTE — Op Note (Signed)
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 Posterior lumbar interbody arthrodesis using peek spacers local autograft and allograft utilizing N by 25 mm 8 lordotic cages at L5-S1 and 11 x 25 8 lordotic cages at L4-5 pedicle screw fixation with Zimmer Vitale screws 6.5 x 45 mm and 60 mm rods between L4 and the sacrum. Posterior lateral arthrodesis using infuse and local autograft.  Surgeon Kristeen Miss first assistant is Ashley Jacobs M.D. Anesthesia Gen. endotracheal Indications: Sheri Baker is a 65 year old individual is had progressive back and bilateral lower extremity pain. She is had a spondylolisthesis at the level of L4-L5 and significant stenosis at L4-5 and L5-S1 with advanced degenerative changes in the disc. She is advised regarding surgical decompression and arthrodesis from L4 to the sacrum.  Procedure: The patient was brought to the operating room supine on a stretcher. After the smooth induction of general endotracheal anesthesia, she was turned prone. The back was prepped with alcohol DuraPrep and draped in a sterile fashion. Midline incision was created and carried down to the lumbar dorsal fascia. A self-retaining retractor was placed in the wound and the fascia was opened on either side of midline to expose the spinous processes of L5 and L4. Radiographic confirmation was obtained of the spaces. Then laminectomy at L4 was carried out by removing the inferior marginal lamina out to and including the entirety of the L4-L5 facet. This was done bilaterally. In the laminar arch of L5 was removed and completely along with the facet joint at the L5-S1 space. This bone was used for bone graft and was morcellized into  small pieces. The laminectomy was then widened to expose and decompress the path of the L4 nerve root superiorly and the L5 nerve root in the middle and the S1 nerve root inferiorly. The disc spaces were isolated was noted to be substantial stenosis particularly on left side at the L5-S1 level on the right side at the L4-L5 level. Each of the individual nerve roots were decompressed using a 2 mm Kerrison punch along with a high-speed drill. In the end the disc spaces were isolated and at L5-S1 the disc space was opened and a complete discectomy was performed. The endplates were curettaged smooth and then the interspace was sized for appropriate size spacer. At L5 S1 1 10 mm tall 8 lordotic spacer measuring 25 mm in length was placed after being filled with autograft and additional 9 mL of autograft was placed into the interspace. This was accompanied by to strips of infuse. Attention was then turned to L4-5 were similar process was carried out here a total of 9 mL of autograft was placed into the interspace along with strips of infuse and 11 mm tall 8 lordotic cages measuring 25 mm in length. Lateral gutters were then cleared from the transverse process of L4-L5 to the sacral ala. The gutters were packed with strips of infuse along with the remainder of the autograft that totaled 6 mL. With the lateral gutters being packed attention was turned to placing of pedicle screws which was done under fluoroscopic guidance. 6.5 x 45 mm screws were placed in L4-L5 and the sacrum. 60 mm precontoured rods were then fitted between the screw heads and tightening usual construct. The area was inspected then carefully no CSF leaks were encountered, the  nerve roots were amply decompressed. Hemostasis was well maintained. Lumbar dorsal fascia was then reapproximated with #1 Vicryl in interrupted fashion, 2-0 Vicryl was used in subcutaneous anus tissues, 3-0 Vicryl subcuticularly. Blood loss for the procedure was estimated at 750 mL  and 250 mL of blood was returned to the patient from the Cell Saver. She was returned to the recovery room in stable condition.

## 2016-03-27 NOTE — H&P (Signed)
Sheri Baker is an 65 y.o. female.   Chief Complaint: Back and bilateral leg pain HPI: Patient is 65 year old individual who's had progressive difficulty with significant centralized back pain and now radiation into both buttocks and down both thighs. She has modest weakness in tibialis anterior groups bilaterally. Plain x-rays demonstrate that she has advanced degenerative changes at L4-5 and L5-S1 with a grade 1 spondylolisthesis at L4-L5. A myelogram was performed recently and this confirms presence of severe lateral recess stenosis at L4-L5 and L5-S1 with marked degenerative changes in the disc spaces. Patient has been advised regarding the need for surgical decompression and stabilization and she is now admitted for this procedure from L4 to the sacrum.  Past Medical History  Diagnosis Date  . Hypertension   . Dysrhythmia     "palpations with full dose of hydrocodone"- takes 1/2 of Hydrocodone- Acetaminophen and does not have palpations  . 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 Hypothyroidism   . GERD (gastroesophageal reflux disease)   . Constipation   . History of hiatal hernia   . Hemorrhoids   . Anemia   . Arthritis   . Headache     occasional    Past Surgical History  Procedure Laterality Date  . Foot surgery Left     Callus- reaset bone  . Abdominal hysterectomy      partial -right ovary remains  . Tonsillectomy    . Cervix removal    . Colonoscopy      No family history on file. Social History:  reports that she has quit smoking. She does not have any smokeless tobacco history on file. She reports that she drinks about 1.8 oz of alcohol per week. She reports that she does not use illicit drugs.  Allergies:  Allergies  Allergen Reactions  . Hydrocodone Palpitations  . Penicillins Rash    Has patient had a PCN reaction causing immediate rash, facial/tongue/throat swelling, SOB or lightheadedness with hypotension: Yes Has  patient had a PCN reaction causing severe rash involving mucus membranes or skin necrosis: No Has patient had a PCN reaction that required hospitalization No Has patient had a PCN reaction occurring within the last 10 years: No If all of the above answers are "NO", then may proceed with Cephalosporin use.     Medications Prior to Admission  Medication Sig Dispense Refill  . baclofen (LIORESAL) 10 MG tablet Take 10 mg by mouth daily as needed for muscle spasms.   5  . Cholecalciferol (VITAMIN D-3) 1000 units CAPS Take 4,000 Units by mouth daily.    . CRESTOR 20 MG tablet Take 20 mg by mouth daily.   6  . gabapentin (NEURONTIN) 300 MG capsule Take 300 mg by mouth 3 (three) times daily.   2  . HYDROcodone-acetaminophen (NORCO/VICODIN) 5-325 MG tablet Take 1 tablet by mouth every 6 (six) hours as needed for moderate pain.   0  . losartan (COZAAR) 100 MG tablet Take 100 mg by mouth daily.   5  . meloxicam (MOBIC) 15 MG tablet Take 15 mg by mouth daily.   0  . NATURE-THROID 16.25 MG TABS Take 16.25 mg by mouth daily.   2  . omeprazole (PRILOSEC OTC) 20 MG tablet Take 20 mg by mouth daily.    Marland Kitchen OVER THE COUNTER MEDICATION Take 15 mLs by mouth daily. Beet powder supplement    . polyethylene glycol (MIRALAX / GLYCOLAX) packet Take 17 g by  mouth daily as needed.    . potassium gluconate 595 (99 K) MG TABS tablet Take 1,100 mg by mouth daily.    Marland Kitchen spironolactone-hydrochlorothiazide (ALDACTAZIDE) 25-25 MG tablet Take 1 tablet by mouth every morning.   5  . UNABLE TO FIND 1 capsule. Green tea- with Reservatrol    . UNABLE TO FIND Beet extract      Results for orders placed or performed during the hospital encounter of 03/27/16 (from the past 48 hour(s))  Glucose, capillary     Status: Abnormal   Collection Time: 03/27/16  6:23 AM  Result Value Ref Range   Glucose-Capillary 109 (H) 65 - 99 mg/dL   No results found.  Review of Systems  HENT: Negative.   Eyes: Negative.   Respiratory: Negative.    Cardiovascular: Negative.   Genitourinary: Negative.   Musculoskeletal: Positive for back pain.  Skin: Negative.   Neurological: Positive for tingling, sensory change, focal weakness and weakness.  Psychiatric/Behavioral: Negative.     Blood pressure 148/96, pulse 77, temperature 98 F (36.7 C), temperature source Oral, resp. rate 20, height 5' 5.5" (1.664 m), weight 128.822 kg (284 lb), SpO2 98 %. Physical Exam  Constitutional: She is oriented to person, place, and time. She appears well-developed and well-nourished.  Morbidly obese  HENT:  Head: Normocephalic and atraumatic.  Eyes: Conjunctivae and EOM are normal. Pupils are equal, round, and reactive to light.  Neck: Normal range of motion. Neck supple.  Cardiovascular: Normal rate and regular rhythm.   Respiratory: Effort normal and breath sounds normal.  GI: Soft. Bowel sounds are normal.  Musculoskeletal:  Positive straight leg raising at 15 in either lower extremity. Mild weakness in tibialis anterior groups bilaterally. Gastroc strength is intact. Deep tendon reflexes are diminished in the patellae and the Achilles both. Sensation is intact to pin and vibration in the lower extremities.  Neurological: She is alert and oriented to person, place, and time.  Cranial nerve examination is within the limits of normal. Station and gait is intact the patient walks with mild foot drop.  Skin: Skin is warm and dry.  Psychiatric: She has a normal mood and affect. Her behavior is normal. Judgment and thought content normal.     Assessment/Plan Spondylolisthesis and stenosis L4-5 L5-S1, lumbar radiculopathy Plan: Decompression L4-5 and L5-S1, posterior lumbar interbody arthrodesis L4-5 L5-S1.  Earleen Newport, MD 03/27/2016, 7:29 AM

## 2016-03-27 NOTE — Progress Notes (Signed)
Patient ID: Sheri Baker, female   DOB: August 28, 1951, 65 y.o.   MRN: [id number] Vital signs are stable Motor function appears intact Appears reasonably comfortable Blood sugars okay

## 2016-03-27 NOTE — Progress Notes (Signed)
Orthopedic Tech Progress Note Patient Details:  Sheri Baker 1951/09/06 [id number] Brace order was a pre-fit Patient ID: Sheri Baker, female   DOB: 1950-12-14, 65 y.o.   MRN: [id number]   Braulio Bosch 03/27/2016, 3:03 PM

## 2016-03-27 NOTE — Progress Notes (Signed)
Patient transferred to 69C09. Patient is drowsy, oriented x3, disoriented to time. Equal grips and strength in extremities bilaterally. Honeycomb dressing is clean, dry, and intact. Patient states her pain is "7/10" in lower back, repositioned. SCDs on. RN oriented patient to room, unit, staff. Will continue to monitor.

## 2016-03-27 NOTE — Anesthesia Postprocedure Evaluation (Signed)
Anesthesia Post Note  Patient: Pension scheme manager  Procedure(s) Performed: Procedure(s) (LRB): LUMBAR FOUR-FIVE LUMBAR FIVE-SACRAL ONE POSTERIOR LUMBAR INTERBODY FUSION (N/A)  Patient location during evaluation: PACU Anesthesia Type: General Level of consciousness: sedated, oriented and patient cooperative Pain management: pain level controlled Vital Signs Assessment: post-procedure vital signs reviewed and stable Respiratory status: spontaneous breathing, nonlabored ventilation, respiratory function stable and patient connected to nasal cannula oxygen Cardiovascular status: blood pressure returned to baseline and stable Postop Assessment: no signs of nausea or vomiting Anesthetic complications: no    Last Vitals:  Filed Vitals:   03/27/16 1526 03/27/16 1552  BP: 123/80 113/62  Pulse: 89 94  Temp: 36.1 C 36.4 C  Resp: 15     Last Pain:  Filed Vitals:   03/27/16 1553  PainSc: 7                  JACKSON,E. CARSWELL

## 2016-03-27 NOTE — Transfer of Care (Signed)
Immediate Anesthesia Transfer of Care Note  Patient: Sheri Baker  Procedure(s) Performed: Procedure(s): LUMBAR FOUR-FIVE LUMBAR FIVE-SACRAL ONE POSTERIOR LUMBAR INTERBODY FUSION (N/A)  Patient Location: PACU  Anesthesia Type:General  Level of Consciousness: awake, alert , oriented and patient cooperative  Airway & Oxygen Therapy: Patient Spontanous Breathing and Patient connected to nasal cannula oxygen  Post-op Assessment: Report given to RN, Post -op Vital signs reviewed and stable and Patient moving all extremities X 4  Post vital signs: Reviewed and stable  Last Vitals:  Filed Vitals:   03/27/16 0627 03/27/16 1419  BP: 148/96 130/99  Pulse: 77 117  Temp: 36.7 C 36.3 C  Resp: 20 12    Last Pain:  Filed Vitals:   03/27/16 1422  PainSc: 4          Complications: No apparent anesthesia complications

## 2016-03-27 NOTE — Anesthesia Procedure Notes (Signed)
Procedure Name: Intubation Date/Time: 03/27/2016 7:46 AM Performed by: Carney Living Pre-anesthesia Checklist: Patient identified, Emergency Drugs available, Suction available, Patient being monitored and Timeout performed Patient Re-evaluated:Patient Re-evaluated prior to inductionOxygen Delivery Method: Circle system utilized Preoxygenation: Pre-oxygenation with 100% oxygen Intubation Type: IV induction Ventilation: Mask ventilation without difficulty and Oral airway inserted - appropriate to patient size Laryngoscope Size: Mac and 4 Grade View: Grade I Tube type: Oral Tube size: 7.0 mm Number of attempts: 1 Airway Equipment and Method: Stylet Placement Confirmation: ETT inserted through vocal cords under direct vision,  positive ETCO2 and breath sounds checked- equal and bilateral Secured at: 23 cm Tube secured with: Tape Dental Injury: Teeth and Oropharynx as per pre-operative assessment

## 2016-03-28 LAB — CBC
HCT: 34.1 % — ABNORMAL LOW (ref 36.0–46.0)
Hemoglobin: 10.6 g/dL — ABNORMAL LOW (ref 12.0–15.0)
MCH: 27 pg (ref 26.0–34.0)
MCHC: 31.1 g/dL (ref 30.0–36.0)
MCV: 86.8 fL (ref 78.0–100.0)
Platelets: 203 10*3/uL (ref 150–400)
RBC: 3.93 MIL/uL (ref 3.87–5.11)
RDW: 14.1 % (ref 11.5–15.5)
WBC: 13.6 10*3/uL — ABNORMAL HIGH (ref 4.0–10.5)

## 2016-03-28 LAB — GLUCOSE, CAPILLARY
Glucose-Capillary: 119 mg/dL — ABNORMAL HIGH (ref 65–99)
Glucose-Capillary: 116 mg/dL — ABNORMAL HIGH (ref 65–99)
Glucose-Capillary: 103 mg/dL — ABNORMAL HIGH (ref 65–99)
Glucose-Capillary: 123 mg/dL — ABNORMAL HIGH (ref 65–99)

## 2016-03-28 LAB — BASIC METABOLIC PANEL
Anion gap: 8 (ref 5–15)
BUN: 13 mg/dL (ref 6–20)
CO2: 25 mmol/L (ref 22–32)
Calcium: 8.6 mg/dL — ABNORMAL LOW (ref 8.9–10.3)
Chloride: 105 mmol/L (ref 101–111)
Creatinine, Ser: 0.89 mg/dL (ref 0.44–1.00)
GFR calc Af Amer: >60 mL/min (ref >60)
GFR calc non Af Amer: >60 mL/min (ref >60)
Glucose, Bld: 117 mg/dL — ABNORMAL HIGH (ref 65–99)
Potassium: 4.3 mmol/L (ref 3.5–5.1)
Sodium: 138 mmol/L (ref 135–145)

## 2016-03-28 MED ORDER — THYROID 30 MG PO TABS
15.0000 mg | ORAL_TABLET | Freq: Every day | ORAL | Status: DC
  Administered 2016-03-28 – 2016-03-30 (×3): 15 mg via ORAL
  Filled 2016-03-28 (×3): qty 1

## 2016-03-28 MED ORDER — HYDROMORPHONE HCL 1 MG/ML IJ SOLN
0.5000 mg | INTRAMUSCULAR | Status: DC | PRN
  Administered 2016-03-28: 0.5 mg via INTRAVENOUS
  Administered 2016-03-29: 1 mg via INTRAVENOUS
  Administered 2016-03-29: 0.5 mg via INTRAVENOUS
  Administered 2016-03-29: 1 mg via INTRAVENOUS
  Filled 2016-03-28 (×5): qty 1

## 2016-03-28 NOTE — Progress Notes (Signed)
Inpatient Rehabilitation  OT has recommended IP Rehab.  It is unlikely that Sheri Baker would authorize an IP rehab admission given her current diagnoses/surgical intervention.  We are not recommending consult at this time, however if team would like [place] to attempt, we will be glad to do so.  Please call if questions.  [facility] Admissions Coordinator Cell [phone] Office [phone]

## 2016-03-28 NOTE — Evaluation (Signed)
Occupational Therapy Evaluation Patient Details Name: Sheri Baker MRN: [id number] DOB: November 01, 1950 Today's Date: 03/28/2016    History of Present Illness Patient is 65 year old individual who's had progressive difficulty with significant centralized back pain and now radiation into both buttocks and down both thighs. On 6/6 s/p L4-5 total laminectomy   Clinical Impression   Patient is s/p L4-5 Laminectomy surgery resulting in functional limitations due to the deficits listed below (see OT problem list). PTA was living at home alone with progressive weakness.  Patient will benefit from skilled OT acutely to increase independence and safety with ADLS to allow discharge CIR. Plan to have sister come to (A) her in two weeks or sooner if needed. Sister does not work in the summer time.      Follow Up Recommendations  CIR    Equipment Recommendations  Other (comment) (defer to CIR)    Recommendations for Other Services       Precautions / Restrictions Precautions Precautions: Back Precaution Booklet Issued: Yes (comment) Precaution Comments: pt with good verbal understanding Restrictions Weight Bearing Restrictions: No      Mobility Bed Mobility Overal bed mobility: Needs Assistance Bed Mobility: Sit to Supine       Sit to supine: Mod assist   General bed mobility comments: Pt needed max cueing to sequence and (A) with R LE . pt needed (A) to log roll with good body alignment  Transfers Overall transfer level: Needs assistance   Transfers: Sit to/from Stand Sit to Stand: Mod assist         General transfer comment: modA to steady pt during transition of hands from chair to walker,increased time, guarded    Balance                                            ADL Overall ADL's : Needs assistance/impaired Eating/Feeding: Modified independent;Sitting   Grooming: Wash/dry face;Wash/dry hands;Oral care;Min guard;Standing Grooming Details (indicate  cue type and reason): sink level with cues for back precautions . pt very guarded movements Upper Body Bathing: Min guard;Sitting   Lower Body Bathing: Maximal assistance           Toilet Transfer: +2 for physical assistance;Minimal assistance;RW Toilet Transfer Details (indicate cue type and reason): cues for hand placement Toileting- Clothing Manipulation and Hygiene: Total assistance Toileting - Clothing Manipulation Details (indicate cue type and reason): unable to complete peri care due to body habitus     Functional mobility during ADLs: Minimal assistance;Rolling walker General ADL Comments: pt verbalized back precautions after 3 minute delayed recall. pt reports difficulty with bed mobilty and positioning. Pt will d/c home alone and rely on sister for 1 week and church members for support. sister is off during the summers and can come to visit     Vision     Perception     Praxis      Pertinent Vitals/Pain Pain Assessment: 0-10 Pain Score: 5  Pain Location: back Pain Descriptors / Indicators: Operative site guarding Pain Intervention(s): Monitored during session     Hand Dominance Right   Extremity/Trunk Assessment Upper Extremity Assessment Upper Extremity Assessment: Defer to OT evaluation   Lower Extremity Assessment Lower Extremity Assessment: Generalized weakness (due to long standing h/o pain and numbness)   Cervical / Trunk Assessment Cervical / Trunk Assessment:  (back surgery)   Communication Communication Communication: No difficulties  Cognition Arousal/Alertness: Awake/alert Behavior During Therapy: WFL for tasks assessed/performed Overall Cognitive Status: Within Functional Limits for tasks assessed                     General Comments       Exercises       Shoulder Instructions      Home Living Family/patient expects to be discharged to:: Inpatient rehab Living Arrangements: Alone                                Additional Comments: pt lives alone in 1 story home with 1 STE. pt with sister out of town than can come stay for a week and has supportive church famliy but no one to stay at night. pt has been out of work since January 2017.       Prior Functioning/Environment Level of Independence: Independent        Comments: was furniture and wall walking    OT Diagnosis: Generalized weakness;Acute pain   OT Problem List: Decreased strength;Decreased activity tolerance;Impaired balance (sitting and/or standing);Decreased safety awareness;Decreased knowledge of use of DME or AE;Decreased knowledge of precautions;Obesity;Pain   OT Treatment/Interventions: Self-care/ADL training;Therapeutic exercise;DME and/or AE instruction;Therapeutic activities;Patient/family education;Balance training    OT Goals(Current goals can be found in the care plan section) Acute Rehab OT Goals Patient Stated Goal: to get better in the next two weeks before sister comes to help me OT Goal Formulation: With patient Time For Goal Achievement: 04/11/16 Potential to Achieve Goals: Good  OT Frequency: Min 2X/week   Barriers to D/C: Decreased caregiver support          Co-evaluation              End of Session Equipment Utilized During Treatment: Gait belt;Rolling walker Nurse Communication: Mobility status;Precautions  Activity Tolerance: Patient tolerated treatment well Patient left: in bed;with call bell/phone within reach;with bed alarm set   Time: 0922-0957 OT Time Calculation (min): 35 min Charges:  OT General Charges $OT Visit: 1 Procedure OT Evaluation $OT Eval Moderate Complexity: 1 Procedure G-Codes:    Parke Poisson B 04/27/16, 10:55 AM   Jeri Modena   OTR/L PagerIP:[phone] Office: [phone] .

## 2016-03-28 NOTE — [place] FL2 (Signed)
[place] MEDICAID FL2 LEVEL OF CARE SCREENING TOOL     IDENTIFICATION  Patient Name: Sheri Baker Birthdate: [birth year] Sex: female Admission Date (Current Location): 03/27/2016  [facility] and [place] Number:  Herbalist and Address:  [facility] [place], [place] [address], [place], [place] [address]      Provider Number: [id number]  Attending Physician Name and Address:  Kristeen Miss, MD  Relative Name and Phone Number:       Current Level of Care: Hospital Recommended Level of Care: [place] Prior Approval Number:    Date Approved/Denied:   PASRR Number:    Discharge Plan: SNF    Current Diagnoses: Patient Active Problem List   Diagnosis Date Noted  . Spondylolisthesis of lumbar region 03/27/2016    Orientation RESPIRATION BLADDER Height & Weight     Self, Time, Situation, Place  Normal Continent Weight: (!) 305 lb 12.8 oz (138.71 kg) Height:  5\' 5"  (165.1 cm)  BEHAVIORAL SYMPTOMS/MOOD NEUROLOGICAL BOWEL NUTRITION STATUS      Continent Diet (see DC)  AMBULATORY STATUS COMMUNICATION OF NEEDS Skin   Limited Assist Verbally Surgical wounds                       Personal Care Assistance Level of Assistance  Bathing, Dressing Bathing Assistance: Limited assistance   Dressing Assistance: Limited assistance     Functional Limitations Info             SPECIAL CARE FACTORS FREQUENCY  PT (By licensed PT), OT (By licensed OT)     PT Frequency: 5/wk OT Frequency: 5/wk            Contractures      Additional Factors Info  Code Status, Allergies Code Status Info: FULL Allergies Info: Hydrocodone, Penicillins           Current Medications (03/28/2016):  This is the current hospital active medication list Current Facility-Administered Medications  Medication Dose Route Frequency Provider Last Rate Last Dose  . 0.9 %  sodium chloride infusion  250 mL Intravenous Continuous Kristeen Miss, MD 1 mL/hr at  03/27/16 1651 250 mL at 03/27/16 1651  . 0.9 %  sodium chloride infusion   Intravenous Continuous Kristeen Miss, MD 100 mL/hr at 03/28/16 1258    . acetaminophen (TYLENOL) tablet 650 mg  650 mg Oral Q4H PRN Kristeen Miss, MD       Or  . acetaminophen (TYLENOL) suppository 650 mg  650 mg Rectal Q4H PRN Kristeen Miss, MD      . alum & mag hydroxide-simeth (MAALOX/MYLANTA) 200-200-20 MG/5ML suspension 30 mL  30 mL Oral Q6H PRN Kristeen Miss, MD      . baclofen (LIORESAL) tablet 10 mg  10 mg Oral Daily PRN Kristeen Miss, MD   10 mg at 03/27/16 1648  . bisacodyl (DULCOLAX) suppository 10 mg  10 mg Rectal Daily PRN Kristeen Miss, MD      . docusate sodium (COLACE) capsule 100 mg  100 mg Oral BID Kristeen Miss, MD   100 mg at 03/28/16 1031  . gabapentin (NEURONTIN) capsule 300 mg  300 mg Oral TID Kristeen Miss, MD   300 mg at 03/28/16 1031  . spironolactone (ALDACTONE) tablet 25 mg  25 mg Oral Daily Kristeen Miss, MD   25 mg at 03/28/16 1030   And  . hydrochlorothiazide (HYDRODIURIL) tablet 25 mg  25 mg Oral Daily Kristeen Miss, MD   25 mg at 03/28/16  1030  . HYDROcodone-acetaminophen (NORCO/VICODIN) 5-325 MG per tablet 1 tablet  1 tablet Oral Q6H PRN Kristeen Miss, MD   1 tablet at 03/27/16 2121  . ketorolac (TORADOL) 15 MG/ML injection 15 mg  15 mg Intravenous Q6H Kristeen Miss, MD   15 mg at 03/28/16 1301  . losartan (COZAAR) tablet 100 mg  100 mg Oral Daily Kristeen Miss, MD   100 mg at 03/28/16 1030  . magnesium citrate solution 1 Bottle  1 Bottle Oral Once PRN Kristeen Miss, MD      . menthol-cetylpyridinium (CEPACOL) lozenge 3 mg  1 lozenge Oral PRN Kristeen Miss, MD       Or  . phenol (CHLORASEPTIC) mouth spray 1 spray  1 spray Mouth/Throat PRN Kristeen Miss, MD      . methocarbamol (ROBAXIN) tablet 500 mg  500 mg Oral Q6H PRN Kristeen Miss, MD   500 mg at 03/28/16 1031   Or  . methocarbamol (ROBAXIN) 500 mg in dextrose 5 % 50 mL IVPB  500 mg Intravenous Q6H PRN Kristeen Miss, MD      . morphine 2 MG/ML  injection 1-4 mg  1-4 mg Intravenous Q3H PRN Kristeen Miss, MD   2 mg at 03/28/16 1029  . ondansetron (ZOFRAN) injection 4 mg  4 mg Intravenous Q4H PRN Kristeen Miss, MD   4 mg at 03/27/16 2116  . pantoprazole (PROTONIX) EC tablet 40 mg  40 mg Oral Daily Kristeen Miss, MD   40 mg at 03/28/16 1031  . polyethylene glycol (MIRALAX / GLYCOLAX) packet 17 g  17 g Oral Daily PRN Kristeen Miss, MD      . rosuvastatin (CRESTOR) tablet 20 mg  20 mg Oral q1800 Kristeen Miss, MD   20 mg at 03/27/16 1826  . senna (SENOKOT) tablet 8.6 mg  1 tablet Oral BID Kristeen Miss, MD   8.6 mg at 03/28/16 1031  . sodium chloride flush (NS) 0.9 % injection 3 mL  3 mL Intravenous Q12H Kristeen Miss, MD   3 mL at 03/27/16 2200  . sodium chloride flush (NS) 0.9 % injection 3 mL  3 mL Intravenous PRN Kristeen Miss, MD      . thyroid (ARMOUR) tablet 15 mg  15 mg Oral QAC breakfast Kristeen Miss, MD   15 mg at 03/28/16 1029     Discharge Medications: Please see discharge summary for a list of discharge medications.  Relevant Imaging Results:  Relevant Lab Results:   Additional Information SS#: [id number]  Cranford Mon, [place]

## 2016-03-28 NOTE — Evaluation (Signed)
Physical Therapy Evaluation Patient Details Name: Sheri Baker MRN: [id number] DOB: 10-19-1951 Today's Date: 03/28/2016   History of Present Illness  Patient is 65 year old individual who's had progressive difficulty with significant centralized back pain and now radiation into both buttocks and down both thighs. On 6/6 s/p L4-5 total laminectomy  Clinical Impression  Patient is s/p above surgery resulting in the deficits listed below (see PT Problem List). Pt tolerated OOB mobility well for first time. Pt was indep PTA.  Patient will benefit from skilled PT to increase their independence and safety with mobility (while adhering to their precautions) to allow discharge to the venue listed below.     Follow Up Recommendations CIR    Equipment Recommendations  Rolling walker with 5" wheels;3in1 (PT)    Recommendations for Other Services Rehab consult     Precautions / Restrictions Precautions Precautions: Back Precaution Booklet Issued: Yes (comment) Precaution Comments: pt with good verbal understanding Restrictions Weight Bearing Restrictions: No      Mobility  Bed Mobility Overal bed mobility: Needs Assistance Bed Mobility: Sit to Supine       Sit to supine: Mod assist   General bed mobility comments: Pt needed max cueing to sequence and (A) with R LE . pt needed (A) to log roll with good body alignment  Transfers Overall transfer level: Needs assistance   Transfers: Sit to/from Stand Sit to Stand: Mod assist         General transfer comment: modA to steady pt during transition of hands from chair to walker,increased time, guarded  Ambulation/Gait Ambulation/Gait assistance: Min assist;+2 safety/equipment (for chair follow) Ambulation Distance (Feet): 55 Feet Assistive device: Rolling walker (2 wheeled) Gait Pattern/deviations: Decreased stride length;Step-to pattern Gait velocity: slow   General Gait Details: significant UE WBing, v/c's to relax shoulders,  slow and guarded  Stairs            Wheelchair Mobility    Modified Rankin (Stroke Patients Only)       Balance Overall balance assessment: Needs assistance Sitting-balance support: Feet supported;No upper extremity supported Sitting balance-Leahy Scale: Fair     Standing balance support: During functional activity Standing balance-Leahy Scale: Fair Standing balance comment: pt stood at sink with OT to do ADLs, no episodes of LOB                             Pertinent Vitals/Pain Pain Assessment: 0-10 Pain Score: 5  Pain Location: back Pain Descriptors / Indicators: Operative site guarding Pain Intervention(s): Monitored during session    Home Living Family/patient expects to be discharged to:: Inpatient rehab Living Arrangements: Alone               Additional Comments: pt lives alone in 1 story home with 1 STE. pt with sister out of town than can come stay for a week and has supportive church famliy but no one to stay at night. pt has been out of work since January 2017.     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     Hand Dominance   Dominant Hand: Right    Extremity/Trunk Assessment   Upper Extremity Assessment: Defer to OT evaluation           Lower Extremity Assessment: Generalized weakness (due to long standing h/o pain and numbness)      Cervical / Trunk Assessment:  (back  surgery)  Communication   Communication: No difficulties  Cognition Arousal/Alertness: Awake/alert Behavior During Therapy: WFL for tasks assessed/performed Overall Cognitive Status: Within Functional Limits for tasks assessed                      General Comments General comments (skin integrity, edema, etc.): back incision with no drainage    Exercises        Assessment/Plan    PT Assessment Patient needs continued PT services  PT Diagnosis Difficulty walking;Generalized  weakness   PT Problem List Decreased strength;Decreased range of motion;Decreased activity tolerance;Decreased balance;Decreased mobility  PT Treatment Interventions DME instruction;Gait training;Stair training;Functional mobility training;Therapeutic activities;Therapeutic exercise   PT Goals (Current goals can be found in the Care Plan section) Acute Rehab PT Goals Patient Stated Goal: to get better in the next two weeks before sister comes to help me PT Goal Formulation: With patient Time For Goal Achievement: 04/04/16 Potential to Achieve Goals: Good    Frequency Min 5X/week   Barriers to discharge Decreased caregiver support lives alone    Co-evaluation               End of Session Equipment Utilized During Treatment: Gait belt;Back brace Activity Tolerance: Patient tolerated treatment well Patient left: in bed;with call bell/phone within reach;with bed alarm set Nurse Communication: Mobility status         Time: [id number] PT Time Calculation (min) (ACUTE ONLY): 39 min   Charges:   PT Evaluation $PT Eval Moderate Complexity: 1 Procedure PT Treatments $Gait Training: 8-22 mins   PT G CodesKingsley Callander 03/28/2016, 11:13 AM   Kittie Plater, PT, DPT Pager #: [phone] Office #: [phone]

## 2016-03-28 NOTE — Care Management Note (Signed)
Case Management Note  Patient Details  Name: Sheri Baker MRN: [id number] Date of Birth: 07/13/1951  Subjective/Objective:    Pt admitted and underwent: LUMBAR FOUR-FIVE LUMBAR FIVE-SACRAL ONE POSTERIOR LUMBAR INTERBODY FUSION. She is from home alone.           Action/Plan: Awaiting PT/OT recommendations. CM following for discharge disposition.   Expected Discharge Date:                  Expected Discharge Plan:     In-House Referral:     Discharge planning Services     Post Acute Care Choice:    Choice offered to:     DME Arranged:    DME Agency:     HH Arranged:    HH Agency:     Status of Service:  In process, will continue to follow  Medicare Important Message Given:    Date Medicare IM Given:    Medicare IM give by:    Date Additional Medicare IM Given:    Additional Medicare Important Message give by:     If discussed at [place] of Stay Meetings, dates discussed:    Additional Comments:  Pollie Friar, RN 03/28/2016, 9:08 AM

## 2016-03-28 NOTE — Consult Note (Signed)
Physical Medicine and Rehabilitation Consult   Reason for Consult: Lumbar radiculopathy Referring Physician: Dr. Ellene Route   HPI: Sheri Baker is a 65 y.o. 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 Patient elected to undergo decompressive laminectomy  L4-L5, L5/S1 with decompression of L4, L5 and S1 nerve roots on 03/27/16 by Dr. Ellene Route. She continues to have issues with pain control and was started on Toredol to help manage symptoms. PT/OT evaluations done yesterday and CIR recommended for follow up therapy.    Review of Systems  HENT: Negative for hearing loss.   Eyes: Negative for blurred vision and double vision.  Respiratory: Negative for cough.        Sore throat  Cardiovascular: Positive for chest pain (soreness in chest area). Negative for palpitations.  Gastrointestinal: Positive for heartburn and constipation. Negative for nausea.  Genitourinary: Negative for dysuria and urgency.  Musculoskeletal: Positive for myalgias and back pain.  Skin: Negative for itching and rash.  Neurological: Positive for sensory change (bilateral thigh pain) and focal weakness (weakness RLE). Negative for headaches.  Psychiatric/Behavioral: Negative for depression. The patient is not nervous/anxious and does not have insomnia.       Past Medical History  Diagnosis Date  . Hypertension   . Dysrhythmia     "palpations with full dose of hydrocodone"- takes 1/2 of Hydrocodone- Acetaminophen and does not have palpations  . 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 Hypothyroidism   . GERD (gastroesophageal reflux disease)   . Constipation   . History of hiatal hernia   . Hemorrhoids   . Anemia   . Arthritis   . Headache     occasional    Past Surgical History  Procedure Laterality Date  . Foot surgery Left     Callus-  reaset bone  . Abdominal hysterectomy      partial -right ovary remains  . Tonsillectomy    . Cervix removal    . Colonoscopy      History reviewed. No pertinent family history.    Social History:  Lives alone.  Has been out of work since Jan due to back pain--has been furniture walking for the past few months.  Sister plans on helping about a week or so after discharge. She reports that she has quit smoking 40 years ago She does not have any smokeless tobacco history on file. She reports that she drinks a glass of wine couple of times a week. She reports that she does not use illicit drugs.     Allergies  Allergen Reactions  . Hydrocodone Palpitations  . Penicillins Rash    Has patient had a PCN reaction causing immediate rash, facial/tongue/throat swelling, SOB or lightheadedness with hypotension: Yes Has patient had a PCN reaction causing severe rash involving mucus membranes or skin necrosis: No Has patient had a PCN reaction that required hospitalization No Has patient had a PCN reaction occurring within the last 10 years: No If all of the above answers are "NO", then may proceed with Cephalosporin use.      Medications Prior to Admission  Medication Sig Dispense Refill  . baclofen (LIORESAL) 10 MG tablet Take 10 mg by mouth daily as needed for muscle spasms.   5  . Cholecalciferol (VITAMIN D-3) 1000 units CAPS Take 4,000 Units by mouth daily.    Marland Kitchen  CRESTOR 20 MG tablet Take 20 mg by mouth daily.   6  . gabapentin (NEURONTIN) 300 MG capsule Take 300 mg by mouth 3 (three) times daily.   2  . HYDROcodone-acetaminophen (NORCO/VICODIN) 5-325 MG tablet Take 1 tablet by mouth every 6 (six) hours as needed for moderate pain.   0  . losartan (COZAAR) 100 MG tablet Take 100 mg by mouth daily.   5  . meloxicam (MOBIC) 15 MG tablet Take 15 mg by mouth daily.   0  . NATURE-THROID 16.25 MG TABS Take 16.25 mg by mouth daily.   2  . omeprazole (PRILOSEC OTC) 20 MG tablet Take 20 mg by  mouth daily.    Marland Kitchen OVER THE COUNTER MEDICATION Take 15 mLs by mouth daily. Beet powder supplement    . polyethylene glycol (MIRALAX / GLYCOLAX) packet Take 17 g by mouth daily as needed.    . potassium gluconate 595 (99 K) MG TABS tablet Take 1,100 mg by mouth daily.    Marland Kitchen spironolactone-hydrochlorothiazide (ALDACTAZIDE) 25-25 MG tablet Take 1 tablet by mouth every morning.   5  . UNABLE TO FIND 1 capsule. Green tea- with Reservatrol    . UNABLE TO FIND Beet extract      Home: Home Living Family/patient expects to be discharged to:: Inpatient rehab Living Arrangements: Alone Additional Comments: pt lives alone in 1 story home with 1 STE. pt with sister out of town than can come stay for a week and has supportive church famliy but no one to stay at night. pt has been out of work since January 2017.   Functional History: Prior Function Level of Independence: Independent Comments: was furniture and wall walking Functional Status:  Mobility: Bed Mobility Overal bed mobility: Needs Assistance Bed Mobility: Sit to Supine Sit to supine: Mod assist General bed mobility comments: Pt needed max cueing to sequence and (A) with R LE . pt needed (A) to log roll with good body alignment Transfers Overall transfer level: Needs assistance Transfers: Sit to/from Stand Sit to Stand: Mod assist General transfer comment: modA to steady pt during transition of hands from chair to walker,increased time, guarded Ambulation/Gait Ambulation/Gait assistance: Min assist, +2 safety/equipment (for chair follow) Ambulation Distance (Feet): 55 Feet Assistive device: Rolling walker (2 wheeled) Gait Pattern/deviations: Decreased stride length, Step-to pattern General Gait Details: significant UE WBing, v/c's to relax shoulders, slow and guarded Gait velocity: slow    ADL: ADL Overall ADL's : Needs assistance/impaired Eating/Feeding: Modified independent, Sitting Grooming: Wash/dry face, Wash/dry hands, Oral  care, Min guard, Standing Grooming Details (indicate cue type and reason): sink level with cues for back precautions . pt very guarded movements Upper Body Bathing: Min guard, Sitting Lower Body Bathing: Maximal assistance Toilet Transfer: +2 for physical assistance, Minimal assistance, RW Toilet Transfer Details (indicate cue type and reason): cues for hand placement Toileting- Clothing Manipulation and Hygiene: Total assistance Toileting - Clothing Manipulation Details (indicate cue type and reason): unable to complete peri care due to body habitus Functional mobility during ADLs: Minimal assistance, Rolling walker General ADL Comments: pt verbalized back precautions after 3 minute delayed recall. pt reports difficulty with bed mobilty and positioning. Pt will d/c home alone and rely on sister for 1 week and church members for support. sister is off during the summers and can come to visit  Cognition: Cognition Overall Cognitive Status: Within Functional Limits for tasks assessed Orientation Level: Oriented X4 Cognition Arousal/Alertness: Awake/alert Behavior During Therapy: WFL for tasks assessed/performed Overall Cognitive  Status: Within Functional Limits for tasks assessed   Blood pressure 101/61, pulse 109, temperature 99.1 F (37.3 C), temperature source Oral, resp. rate 20, height 5\' 5"  (1.651 m), weight 138.71 kg (305 lb 12.8 oz), SpO2 93 %. Physical Exam  Nursing note and vitals reviewed. Constitutional: She is oriented to person, place, and time. She appears well-developed and well-nourished.  Morbidly obese female  HENT:  Head: Normocephalic and atraumatic.  Mouth/Throat: Oropharynx is clear and moist. No oropharyngeal exudate.  Eyes: Conjunctivae are normal. Pupils are equal, round, and reactive to light.  Neck: Normal range of motion. Neck supple.  Cardiovascular: Normal rate and regular rhythm.   Respiratory: Effort normal and breath sounds normal. No respiratory  distress. She has no wheezes.  GI: Soft. Bowel sounds are normal. She exhibits no distension. There is tenderness.  Neurological: She is alert and oriented to person, place, and time. No cranial nerve deficit. Coordination normal.  UE motor 5/5. LE: 3/5 HF, 4/5 KE and ADF/PF. No gross sensory deficits except in gluteal areas.   Skin: Skin is warm and dry.  Psychiatric: She has a normal mood and affect. Her behavior is normal. Thought content normal.    Results for orders placed or performed during the hospital encounter of 03/27/16 (from the past 24 hour(s))  Glucose, capillary     Status: Abnormal   Collection Time: 03/28/16 11:12 AM  Result Value Ref Range   Glucose-Capillary 116 (H) 65 - 99 mg/dL  Glucose, capillary     Status: Abnormal   Collection Time: 03/28/16  5:10 PM  Result Value Ref Range   Glucose-Capillary 103 (H) 65 - 99 mg/dL  Glucose, capillary     Status: Abnormal   Collection Time: 03/28/16 10:01 PM  Result Value Ref Range   Glucose-Capillary 123 (H) 65 - 99 mg/dL   Comment 1 Notify RN    Comment 2 Document in Chart   Glucose, capillary     Status: Abnormal   Collection Time: 03/29/16  6:15 AM  Result Value Ref Range   Glucose-Capillary 102 (H) 65 - 99 mg/dL   Comment 1 Notify RN    Comment 2 Document in Chart    Dg Lumbar Spine 2-3 Views  03/27/2016  CLINICAL DATA:  PLIF of L4- L5, L5-S1. EXAM: LUMBAR SPINE - 2-3 VIEW; DG C-ARM 61-120 MIN fluoroscopic time 1 minutes and 5 seconds. COMPARISON:  None. FINDINGS: Two fluoroscopic images demonstrate patient status posterior fusion of L4 through S1 with no malalignment. IMPRESSION: Posterior fusion of L4 through S1 with no malalignment. Electronically Signed   By: Abelardo Diesel M.D.   On: 03/27/2016 13:51   Dg Lumbar Spine 1 View  03/27/2016  CLINICAL DATA:  Posterior fusion L4-L5 S1 EXAM: LUMBAR SPINE - 1 VIEW COMPARISON:  01/30/2016 FINDINGS: Single lateral views of the lumbar spine submitted. There is a posterior  localization instrument at the level of L4 spinous process. Second posterior localization instrument at the level of lower endplate of L5 vertebral body/inferior aspect of spinous process of L5. There is disc space flattening with vacuum disc phenomenon at L5-S1 level. She IMPRESSION: There is a posterior localization instrument at the level of L4 spinous process. Second posterior localization instrument at the level of lower endplate of L5 vertebral body/inferior aspect of spinous process of L5. Electronically Signed   By: Lahoma Crocker M.D.   On: 03/27/2016 11:18   Dg C-arm 1-60 Min  03/27/2016  CLINICAL DATA:  PLIF of L4- L5, L5-S1.  EXAM: LUMBAR SPINE - 2-3 VIEW; DG C-ARM 61-120 MIN fluoroscopic time 1 minutes and 5 seconds. COMPARISON:  None. FINDINGS: Two fluoroscopic images demonstrate patient status posterior fusion of L4 through S1 with no malalignment. IMPRESSION: Posterior fusion of L4 through S1 with no malalignment. Electronically Signed   By: Abelardo Diesel M.D.   On: 03/27/2016 13:51    Assessment/Plan: Diagnosis: morbidly obese female with lumbar spondylolisthesis/stenosis/lumbar radiculopathy s/p decompression/laminectomy  1. Does the need for close, 24 hr/day medical supervision in concert with the patient's rehab needs make it unreasonable for this patient to be served in a less intensive setting? Yes 2. Co-Morbidities requiring supervision/potential complications: morbid obesity, pain control 3. Due to bladder management, bowel management, safety, skin/wound care, disease management, medication administration, pain management and patient education, does the patient require 24 hr/day rehab nursing? Yes 4. Does the patient require coordinated care of a physician, rehab nurse, PT (1-2 hrs/day, 5 days/week) and OT (1-2 hrs/day, 5 days/week) to address physical and functional deficits in the context of the above medical diagnosis(es)? Yes Addressing deficits in the following areas: balance,  endurance, locomotion, strength, transferring, bowel/bladder control, bathing, dressing, feeding, grooming, toileting and psychosocial support 5. Can the patient actively participate in an intensive therapy program of at least 3 hrs of therapy per day at least 5 days per week? Yes 6. The potential for patient to make measurable gains while on inpatient rehab is excellent 7. Anticipated functional outcomes upon discharge from inpatient rehab are modified independent  with PT, modified independent with OT, n/a with SLP. 8. Estimated rehab length of stay to reach the above functional goals is: 7 days 9. Does the patient have adequate social supports and living environment to accommodate these discharge functional goals? Yes 10. Anticipated D/C setting: Home 11. Anticipated post D/C treatments: HH therapy and Outpatient therapy 12. Overall Rehab/Functional Prognosis: excellent  RECOMMENDATIONS: This patient's condition is appropriate for continued rehabilitative care in the following setting: CIR Patient has agreed to participate in recommended program. Yes Note that insurance prior authorization may be required for reimbursement for recommended care.  Comment: Pt needs to be modified independent to return home. She is motivated. Rehab Admissions Coordinator to follow up.  Thanks,  Meredith Staggers, MD, Mellody Drown     03/29/2016

## 2016-03-28 NOTE — Clinical Social Work Note (Signed)
Clinical Social Work Assessment  Patient Details  Name: Sheri Baker MRN: [id number] Date of Birth: 20-Jun-1951  Date of referral:  03/28/16               Reason for consult:  Facility Placement                Permission sought to share information with:  Chartered certified accountant granted to share information::  Yes, Verbal Permission Granted  Name::        Agency::  [facility]  Relationship::     Contact Information:     Housing/Transportation Living arrangements for the past 2 months:  Single Family Home Source of Information:  Patient Patient Interpreter Needed:  None Criminal Activity/Legal Involvement Pertinent to Current Situation/Hospitalization:  No - Comment as needed Significant Relationships:  Adult Children, Siblings Lives with:  Self Do you feel safe going back to the place where you live?  No Need for family participation in patient care:  No (Coment)  Care giving concerns:  Pt lives at home alone and does not have family support in this area (recently moved here).     Social Worker assessment / plan:  CSW spoke with pt in regards to PT recommendation for rehab prior to return home.  CSW explained the difference between CIR and SNF- informed pt that CIR did not think it could get insurance auth from [facility] for inpatient stay.  Pt still wanting to attempt to go to CIR since she would be near her doctors here if anything happened.  CSW spoke with pt about SNF as back up if CIR not an option- pt interested in [facility] in [place] explained that this facility is not in [employer] and therefore we cannot safely send referral- pt second choice is [facility].  Employment status:  Retired Health visitor, Managed Care PT Recommendations:  Inpatient [place] ([place] does not think insurance will approve) Information / Referral to community resources:  [place]  Patient/Family's Response to care: Agreeable to SNF  but very hopeful that CIR will be able to admit.  Patient/Family's Understanding of and Emotional Response to Diagnosis, Current Treatment, and Prognosis:  No questions or concerns- just nervous about the possibility of being moved away from her doctors after this procedure.  Emotional Assessment Appearance:  Appears younger than stated age Attitude/Demeanor/Rapport:    Affect (typically observed):  Appropriate, Accepting Orientation:  Oriented to Situation, Oriented to  Time, Oriented to Place, Oriented to Self Alcohol / Substance use:  Not Applicable Psych involvement (Current and /or in the community):  No (Comment)  Discharge Needs  Concerns to be addressed:  Care Coordination Readmission within the last 30 days:  No Current discharge risk:  Physical Impairment, Lack of support system Barriers to Discharge:  Continued Medical Work up   [employer], [place], LCSW 03/28/2016, 2:12 PM

## 2016-03-29 DIAGNOSIS — M4316 Spondylolisthesis, lumbar region: Secondary | ICD-10-CM

## 2016-03-29 LAB — GLUCOSE, CAPILLARY
Glucose-Capillary: 102 mg/dL — ABNORMAL HIGH (ref 65–99)
Glucose-Capillary: 105 mg/dL — ABNORMAL HIGH (ref 65–99)
Glucose-Capillary: 89 mg/dL (ref 65–99)
Glucose-Capillary: 123 mg/dL — ABNORMAL HIGH (ref 65–99)

## 2016-03-29 MED ORDER — KETOROLAC TROMETHAMINE 15 MG/ML IJ SOLN
15.0000 mg | Freq: Four times a day (QID) | INTRAMUSCULAR | Status: AC
  Administered 2016-03-29 – 2016-03-30 (×5): 15 mg via INTRAVENOUS
  Filled 2016-03-29 (×5): qty 1

## 2016-03-29 NOTE — Progress Notes (Signed)
Rehab admissions - I have called and opened the case for acute inpatient rehab admission.  Per [employer], case manager cannot approve inpatient rehab admission without medical director approval.  It is unlikely that we will get approval for acute inpatient rehab admission.  I will follow up again tomorrow.  Call me for questions.  [id number]

## 2016-03-29 NOTE — Progress Notes (Signed)
Physical Therapy Treatment Patient Details Name: Sheri Baker MRN: [id number] DOB: [birth year] Today's Date: 03/29/2016    History of Present Illness pt rpesents with L4-5 Total Lami.  pt with hx of HTN and DM.      PT Comments    Pt needs encouragement for participation and step-by-step cueing for log roll and back precautions.  Pt would benefit from continued therapy to maximize education and independence prior to returning to home.  Will continue to follow.    Follow Up Recommendations  [facility]     Equipment Recommendations  Rolling walker with 5" wheels;3in1 (PT)    Recommendations for Other Services       Precautions / Restrictions Precautions Precautions: Back Precaution Comments: Reviewed back precautions. Required Braces or Orthoses: Spinal Brace Spinal Brace: Lumbar corset;Applied in sitting position Restrictions Weight Bearing Restrictions: No    Mobility  Bed Mobility Overal bed mobility: Needs Assistance Bed Mobility: Rolling;Sidelying to Sit Rolling: Min assist Sidelying to sit: Min assist       General bed mobility comments: cues for step-by-step technique with log roll and max encouragement.  Transfers Overall transfer level: Needs assistance Equipment used: Rolling walker (2 wheeled) Transfers: Sit to/from Stand Sit to Stand: Min assist         General transfer comment: cues for UE use and encouragement  Ambulation/Gait Ambulation/Gait assistance: Min guard Ambulation Distance (Feet): 50 Feet Assistive device: Rolling walker (2 wheeled) Gait Pattern/deviations: Step-through pattern;Decreased stride length     General Gait Details: pt very labored with ambulation due to pain.  cues for encouragement and back precautions during ambulation.     Stairs            Wheelchair Mobility    Modified Rankin (Stroke Patients Only)       Balance Overall balance assessment: Needs assistance Sitting-balance support: No upper extremity  supported;Feet supported Sitting balance-Leahy Scale: Fair     Standing balance support: Bilateral upper extremity supported;During functional activity Standing balance-Leahy Scale: Fair                      Cognition Arousal/Alertness: Awake/alert Behavior During Therapy: WFL for tasks assessed/performed Overall Cognitive Status: Within Functional Limits for tasks assessed                      Exercises      General Comments        Pertinent Vitals/Pain Pain Assessment: 0-10 Pain Score: 6  Pain Location: Back Pain Descriptors / Indicators: Aching Pain Intervention(s): Monitored during session;Premedicated before session;Repositioned    Home Living                      Prior Function            PT Goals (current goals can now be found in the care plan section) Acute Rehab PT Goals Patient Stated Goal: to get better in the next two weeks before sister comes to help me PT Goal Formulation: With patient Time For Goal Achievement: 04/04/16 Potential to Achieve Goals: Good Progress towards PT goals: Progressing toward goals    Frequency  Min 5X/week    PT Plan Current plan remains appropriate    Co-evaluation             End of Session Equipment Utilized During Treatment: Gait belt;Back brace Activity Tolerance: Patient tolerated treatment well Patient left: in chair;with call bell/phone within reach     Time: [phone]  PT Time Calculation (min) (ACUTE ONLY): 21 min  Charges:  $Gait Training: 8-22 mins                    G CodesCatarina Baker, [place] 03/29/2016, 11:12 AM

## 2016-03-29 NOTE — Progress Notes (Signed)
Occupational Therapy Evaluation Patient Details Name: Sheri Baker MRN: [id number] DOB: November 25, 1950 Today's Date: 03/29/2016    History of Present Illness pt rpesents with L4-5 Total Lami.  pt with hx of HTN and DM.     Clinical Impression   Pt progressing towards acute OT goals. Focus of session was UB/LB bathing with pt needing min A for UB bathing and max A for LB bathing. Session details below. D/c plan remains appropriate.     Follow Up Recommendations  CIR    Equipment Recommendations  Other (comment) (defer to next venue)    Recommendations for Other Services       Precautions / Restrictions Precautions Precautions: Back Precaution Comments: Reviewed back precautions. Required Braces or Orthoses: Spinal Brace Spinal Brace: Lumbar corset;Applied in sitting position Restrictions Weight Bearing Restrictions: No      Mobility Bed Mobility Overal bed mobility: Needs Assistance Bed Mobility: Rolling;Sidelying to Sit Rolling: Min assist Sidelying to sit: Min assist       General bed mobility comments: up in chair  Transfers Overall transfer level: Needs assistance Equipment used: Rolling walker (2 wheeled) Transfers: Sit to/from Stand Sit to Stand: Min assist         General transfer comment: from recliner.     Balance Overall balance assessment: Needs assistance Sitting-balance support: No upper extremity supported;Feet supported Sitting balance-Leahy Scale: Fair     Standing balance support: Bilateral upper extremity supported;During functional activity Standing balance-Leahy Scale: Fair Standing balance comment: stood while therapist completed perianal cleaning.                            ADL Overall ADL's : Needs assistance/impaired         Upper Body Bathing: Min guard;Sitting;Minimal assitance Upper Body Bathing Details (indicate cue type and reason): assist to wash upper back Lower Body Bathing: Maximal assistance;Sit to/from  stand Lower Body Bathing Details (indicate cue type and reason): Pt completed upper leg and some anterior perianal cleaning in sitting position. Therapist washed lower legs and feet and finished perianal cleaning with pt in stnading position. Education on AE for bathing and pericare tasks.  Upper Body Dressing : Set up;Sitting Upper Body Dressing Details (indicate cue type and reason): Pt donned back brace with setup from therapist.  Lower Body Dressing: Moderate assistance;Sit to/from stand Lower Body Dressing Details (indicate cue type and reason): assist to doff/don socks               General ADL Comments: Pt completed UB/LB bathing in sit<>stand as detailed above. Encouraged to complete what she could on her own and discussed which tasks she needed assist with Educated on strategies and AE.     Vision     Perception     Praxis      Pertinent Vitals/Pain Pain Assessment: 0-10 Pain Score: 6  Pain Location: back Pain Descriptors / Indicators: Aching Pain Intervention(s): Limited activity within patient's tolerance;Monitored during session;Repositioned     Hand Dominance     Extremity/Trunk Assessment             Communication     Cognition Arousal/Alertness: Awake/alert Behavior During Therapy: WFL for tasks assessed/performed Overall Cognitive Status: Within Functional Limits for tasks assessed                     General Comments       Exercises       Shoulder Instructions  Home Living                                          Prior Functioning/Environment               OT Diagnosis:     OT Problem List:     OT Treatment/Interventions:      OT Goals(Current goals can be found in the care plan section) Acute Rehab OT Goals Patient Stated Goal: to get better in the next two weeks before sister comes to help me OT Goal Formulation: With patient Time For Goal Achievement: 04/11/16 Potential to Achieve Goals:  Good ADL Goals Pt Will Perform Upper Body Bathing: with supervision;sitting Pt Will Perform Lower Body Bathing: with min assist;with adaptive equipment;sit to/from stand Pt Will Transfer to Toilet: with min assist;ambulating;bedside commode Additional ADL Goal #1: Pt will complete bed mobility supervision level with rails as precursor to adls  OT Frequency: Min 2X/week   Barriers to D/C:            Co-evaluation              End of Session Equipment Utilized During Treatment: Rolling walker;Back brace  Activity Tolerance: Patient tolerated treatment well Patient left: in chair;with call bell/phone within reach;with nursing/sitter in room   Time: [id number] OT Time Calculation (min): 32 min Charges:  OT General Charges $OT Visit: 1 Procedure OT Treatments $Self Care/Home Management : 23-37 mins G-Codes:    Hortencia Pilar 2016/04/18, 12:04 PM

## 2016-03-29 NOTE — Progress Notes (Signed)
Patient ID: Sheri Baker, female   DOB: 04/10/1951, 65 y.o.   MRN: [id number] I will signs are stable Patient having a fair amount of pain today She has not had a bowel movement Has been switched over to Dilaudid Will restart Toradol to get better pain control Continue mobilization

## 2016-03-29 NOTE — Progress Notes (Signed)
CSW faxed 30 day note (required for [facility]) to Neuro office for MD to sign and fax back.  Percell Locus Rayyan LCSWA [phone]

## 2016-03-30 ENCOUNTER — Inpatient Hospital Stay (HOSPITAL_COMMUNITY)
Admission: RE | Admit: 2016-03-30 | Discharge: 2016-04-18 | DRG: 091 | Disposition: A | Discharge status: Home Health | Payer: BLUE CROSS/BLUE SHIELD | Source: Intra-hospital | Attending: Physical Medicine & Rehabilitation | Admitting: Physical Medicine & Rehabilitation

## 2016-03-30 DIAGNOSIS — M4806 Spinal stenosis, lumbar region: Secondary | ICD-10-CM | POA: Diagnosis not present

## 2016-03-30 DIAGNOSIS — Z6841 Body Mass Index (BMI) 40.0 and over, adult: Secondary | ICD-10-CM

## 2016-03-30 DIAGNOSIS — F419 Anxiety disorder, unspecified: Secondary | ICD-10-CM | POA: Diagnosis present

## 2016-03-30 DIAGNOSIS — E871 Hypo-osmolality and hyponatremia: Secondary | ICD-10-CM | POA: Diagnosis present

## 2016-03-30 DIAGNOSIS — E119 Type 2 diabetes mellitus without complications: Secondary | ICD-10-CM | POA: Diagnosis present

## 2016-03-30 DIAGNOSIS — D62 Acute posthemorrhagic anemia: Secondary | ICD-10-CM | POA: Diagnosis present

## 2016-03-30 DIAGNOSIS — E1169 Type 2 diabetes mellitus with other specified complication: Secondary | ICD-10-CM | POA: Diagnosis present

## 2016-03-30 DIAGNOSIS — Z79899 Other long term (current) drug therapy: Secondary | ICD-10-CM

## 2016-03-30 DIAGNOSIS — K5901 Slow transit constipation: Secondary | ICD-10-CM | POA: Diagnosis present

## 2016-03-30 DIAGNOSIS — T402X5A Adverse effect of other opioids, initial encounter: Secondary | ICD-10-CM | POA: Diagnosis not present

## 2016-03-30 DIAGNOSIS — Z885 Allergy status to narcotic agent status: Secondary | ICD-10-CM

## 2016-03-30 DIAGNOSIS — I10 Essential (primary) hypertension: Secondary | ICD-10-CM | POA: Diagnosis present

## 2016-03-30 DIAGNOSIS — E039 Hypothyroidism, unspecified: Secondary | ICD-10-CM | POA: Diagnosis present

## 2016-03-30 DIAGNOSIS — K219 Gastro-esophageal reflux disease without esophagitis: Secondary | ICD-10-CM | POA: Diagnosis present

## 2016-03-30 DIAGNOSIS — F411 Generalized anxiety disorder: Secondary | ICD-10-CM | POA: Diagnosis present

## 2016-03-30 DIAGNOSIS — M4316 Spondylolisthesis, lumbar region: Secondary | ICD-10-CM | POA: Diagnosis present

## 2016-03-30 DIAGNOSIS — Z87891 Personal history of nicotine dependence: Secondary | ICD-10-CM | POA: Diagnosis not present

## 2016-03-30 DIAGNOSIS — R079 Chest pain, unspecified: Secondary | ICD-10-CM

## 2016-03-30 DIAGNOSIS — D696 Thrombocytopenia, unspecified: Secondary | ICD-10-CM | POA: Diagnosis not present

## 2016-03-30 DIAGNOSIS — R002 Palpitations: Secondary | ICD-10-CM | POA: Diagnosis not present

## 2016-03-30 DIAGNOSIS — M5416 Radiculopathy, lumbar region: Secondary | ICD-10-CM | POA: Diagnosis not present

## 2016-03-30 DIAGNOSIS — D72829 Elevated white blood cell count, unspecified: Secondary | ICD-10-CM | POA: Diagnosis present

## 2016-03-30 DIAGNOSIS — E669 Obesity, unspecified: Secondary | ICD-10-CM | POA: Diagnosis present

## 2016-03-30 DIAGNOSIS — M48061 Spinal stenosis, lumbar region without neurogenic claudication: Secondary | ICD-10-CM | POA: Diagnosis present

## 2016-03-30 DIAGNOSIS — Z880 Allergy status to penicillin: Secondary | ICD-10-CM | POA: Diagnosis not present

## 2016-03-30 DIAGNOSIS — R2689 Other abnormalities of gait and mobility: Secondary | ICD-10-CM | POA: Diagnosis present

## 2016-03-30 DIAGNOSIS — R0781 Pleurodynia: Secondary | ICD-10-CM | POA: Diagnosis present

## 2016-03-30 DIAGNOSIS — I2699 Other pulmonary embolism without acute cor pulmonale: Secondary | ICD-10-CM | POA: Diagnosis present

## 2016-03-30 DIAGNOSIS — R071 Chest pain on breathing: Secondary | ICD-10-CM | POA: Diagnosis not present

## 2016-03-30 DIAGNOSIS — R52 Pain, unspecified: Secondary | ICD-10-CM | POA: Diagnosis not present

## 2016-03-30 DIAGNOSIS — G5791 Unspecified mononeuropathy of right lower limb: Secondary | ICD-10-CM | POA: Diagnosis not present

## 2016-03-30 DIAGNOSIS — G5793 Unspecified mononeuropathy of bilateral lower limbs: Secondary | ICD-10-CM | POA: Diagnosis present

## 2016-03-30 LAB — GLUCOSE, CAPILLARY
Glucose-Capillary: 85 mg/dL (ref 65–99)
Glucose-Capillary: 81 mg/dL (ref 65–99)
Glucose-Capillary: 113 mg/dL — ABNORMAL HIGH (ref 65–99)
Glucose-Capillary: 109 mg/dL — ABNORMAL HIGH (ref 65–99)

## 2016-03-30 MED ORDER — TRAZODONE HCL 50 MG PO TABS
25.0000 mg | ORAL_TABLET | Freq: Every evening | ORAL | Status: DC | PRN
  Administered 2016-04-10 – 2016-04-14 (×4): 50 mg via ORAL
  Filled 2016-03-30 (×5): qty 1

## 2016-03-30 MED ORDER — ACETAMINOPHEN 325 MG PO TABS
325.0000 mg | ORAL_TABLET | ORAL | Status: DC | PRN
  Administered 2016-04-02 – 2016-04-16 (×6): 650 mg via ORAL
  Filled 2016-03-30 (×7): qty 2
  Filled 2016-03-30: qty 1

## 2016-03-30 MED ORDER — PROCHLORPERAZINE 25 MG RE SUPP: 12.5000 mg | Freq: Four times a day (QID) | RECTAL | Status: DC | PRN

## 2016-03-30 MED ORDER — FLEET ENEMA 7-19 GM/118ML RE ENEM: 1.0000 | ENEMA | Freq: Once | RECTAL | Status: DC | PRN

## 2016-03-30 MED ORDER — MENTHOL 3 MG MT LOZG
1.0000 | LOZENGE | OROMUCOSAL | Status: DC | PRN
Start: 2016-03-30 — End: 2016-04-18
  Filled 2016-03-30: qty 9

## 2016-03-30 MED ORDER — PRO-STAT SUGAR FREE PO LIQD
30.0000 mL | Freq: Two times a day (BID) | ORAL | Status: DC
  Administered 2016-03-30 – 2016-04-18 (×36): 30 mL via ORAL
  Filled 2016-03-30 (×38): qty 30

## 2016-03-30 MED ORDER — OXYCODONE HCL 5 MG PO TABS
5.0000 mg | ORAL_TABLET | ORAL | Status: DC | PRN
  Administered 2016-03-31 – 2016-04-04 (×17): 5 mg via ORAL
  Filled 2016-03-30 (×18): qty 1

## 2016-03-30 MED ORDER — ALUM & MAG HYDROXIDE-SIMETH 200-200-20 MG/5ML PO SUSP
30.0000 mL | ORAL | Status: DC | PRN
  Administered 2016-04-08: 30 mL via ORAL
  Filled 2016-03-30: qty 30

## 2016-03-30 MED ORDER — TRAMADOL HCL 50 MG PO TABS
50.0000 mg | ORAL_TABLET | Freq: Three times a day (TID) | ORAL | Status: DC
  Administered 2016-03-30 – 2016-04-18 (×74): 50 mg via ORAL
  Filled 2016-03-30 (×76): qty 1

## 2016-03-30 MED ORDER — INSULIN ASPART 100 UNIT/ML ~~LOC~~ SOLN: 0.0000 [IU] | Freq: Every day | SUBCUTANEOUS | Status: DC

## 2016-03-30 MED ORDER — SENNOSIDES-DOCUSATE SODIUM 8.6-50 MG PO TABS
1.0000 | ORAL_TABLET | Freq: Two times a day (BID) | ORAL | Status: DC
  Administered 2016-03-30 – 2016-04-02 (×6): 1 via ORAL
  Filled 2016-03-30 (×6): qty 1

## 2016-03-30 MED ORDER — INSULIN ASPART 100 UNIT/ML ~~LOC~~ SOLN
0.0000 [IU] | Freq: Three times a day (TID) | SUBCUTANEOUS | Status: DC
  Administered 2016-04-06: 3 [IU] via SUBCUTANEOUS
  Administered 2016-04-08 – 2016-04-16 (×6): 2 [IU] via SUBCUTANEOUS

## 2016-03-30 MED ORDER — ROSUVASTATIN CALCIUM 10 MG PO TABS
20.0000 mg | ORAL_TABLET | Freq: Every day | ORAL | Status: DC
  Administered 2016-03-30 – 2016-04-17 (×19): 20 mg via ORAL
  Filled 2016-03-30: qty 2
  Filled 2016-03-30: qty 1
  Filled 2016-03-30 (×7): qty 2
  Filled 2016-03-30: qty 1
  Filled 2016-03-30 (×3): qty 2
  Filled 2016-03-30 (×2): qty 1
  Filled 2016-03-30: qty 2
  Filled 2016-03-30: qty 1
  Filled 2016-03-30: qty 2
  Filled 2016-03-30: qty 1

## 2016-03-30 MED ORDER — ALUM & MAG HYDROXIDE-SIMETH 200-200-20 MG/5ML PO SUSP: 30.0000 mL | Freq: Four times a day (QID) | ORAL | Status: DC | PRN

## 2016-03-30 MED ORDER — METHOCARBAMOL 500 MG PO TABS
500.0000 mg | ORAL_TABLET | Freq: Four times a day (QID) | ORAL | Status: DC | PRN
  Administered 2016-04-01 (×2): 500 mg via ORAL
  Filled 2016-03-30 (×2): qty 1

## 2016-03-30 MED ORDER — PHENOL 1.4 % MT LIQD: 1.0000 | OROMUCOSAL | Status: DC | PRN

## 2016-03-30 MED ORDER — POLYSACCHARIDE IRON COMPLEX 150 MG PO CAPS
150.0000 mg | ORAL_CAPSULE | Freq: Every day | ORAL | Status: DC
  Administered 2016-03-31 – 2016-04-17 (×18): 150 mg via ORAL
  Filled 2016-03-30 (×18): qty 1

## 2016-03-30 MED ORDER — BISACODYL 10 MG RE SUPP: 10.0000 mg | Freq: Every day | RECTAL | Status: DC | PRN

## 2016-03-30 MED ORDER — THYROID 30 MG PO TABS
15.0000 mg | ORAL_TABLET | Freq: Every day | ORAL | Status: DC
  Administered 2016-03-31 – 2016-04-18 (×20): 15 mg via ORAL
  Filled 2016-03-30 (×20): qty 1

## 2016-03-30 MED ORDER — PANTOPRAZOLE SODIUM 40 MG PO TBEC
40.0000 mg | DELAYED_RELEASE_TABLET | Freq: Every day | ORAL | Status: DC
  Administered 2016-03-31 – 2016-04-10 (×11): 40 mg via ORAL
  Filled 2016-03-30 (×11): qty 1

## 2016-03-30 MED ORDER — PROCHLORPERAZINE MALEATE 5 MG PO TABS: 5.0000 mg | ORAL_TABLET | Freq: Four times a day (QID) | ORAL | Status: DC | PRN

## 2016-03-30 MED ORDER — SPIRONOLACTONE 25 MG PO TABS
25.0000 mg | ORAL_TABLET | Freq: Every day | ORAL | Status: DC
  Administered 2016-03-31 – 2016-04-18 (×19): 25 mg via ORAL
  Filled 2016-03-30 (×19): qty 1

## 2016-03-30 MED ORDER — GABAPENTIN 300 MG PO CAPS
300.0000 mg | ORAL_CAPSULE | Freq: Three times a day (TID) | ORAL | Status: DC
  Administered 2016-03-30 – 2016-04-03 (×11): 300 mg via ORAL
  Filled 2016-03-30 (×11): qty 1

## 2016-03-30 MED ORDER — PROCHLORPERAZINE EDISYLATE 5 MG/ML IJ SOLN: 5.0000 mg | Freq: Four times a day (QID) | INTRAMUSCULAR | Status: DC | PRN

## 2016-03-30 MED ORDER — HYDROCHLOROTHIAZIDE 25 MG PO TABS
25.0000 mg | ORAL_TABLET | Freq: Every day | ORAL | Status: DC
  Administered 2016-03-31 – 2016-04-18 (×19): 25 mg via ORAL
  Filled 2016-03-30 (×19): qty 1

## 2016-03-30 MED ORDER — POLYETHYLENE GLYCOL 3350 17 G PO PACK
17.0000 g | PACK | Freq: Every day | ORAL | Status: DC | PRN
  Administered 2016-04-03 – 2016-04-04 (×2): 17 g via ORAL
  Filled 2016-03-30 (×2): qty 1

## 2016-03-30 MED ORDER — GUAIFENESIN-DM 100-10 MG/5ML PO SYRP: 5.0000 mL | ORAL_SOLUTION | Freq: Four times a day (QID) | ORAL | Status: DC | PRN

## 2016-03-30 MED ORDER — DIPHENHYDRAMINE HCL 12.5 MG/5ML PO ELIX: 12.5000 mg | ORAL_SOLUTION | Freq: Four times a day (QID) | ORAL | Status: DC | PRN

## 2016-03-30 NOTE — H&P (Signed)
Physical Medicine and Rehabilitation Admission H&P    CC: Lumbar spondylolisthesis with stenosis and radiculopathy.   HPI:   Sheri Baker is a 65 y.o. 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 Patient elected to undergo decompressive laminectomy L4-L5, L5/S1 with decompression of L4, L5 and S1 nerve roots on 03/27/16 by Dr. Ellene Route. She continued to have issues with pain control and was started on Toredol to help manage symptoms. Post op with ABLA and leucocytosis with WBC up to 13.6. Therapy ongoing and patient limited by pain as well as back precautions. CIR recommended for follow up therapy.    Review of Systems  HENT: Negative for hearing loss.   Eyes: Negative for blurred vision and double vision.  Respiratory: Positive for shortness of breath. Negative for cough and wheezing.   Cardiovascular: Negative for chest pain and palpitations.  Gastrointestinal: Negative for heartburn, nausea, abdominal pain and constipation.  Genitourinary: Negative for urgency and frequency.  Musculoskeletal: Positive for myalgias and back pain.  Skin: Negative for itching and rash.  Neurological: Positive for dizziness, sensory change, focal weakness, weakness and headaches.  Psychiatric/Behavioral: The patient is nervous/anxious and has insomnia.       Past Medical History  Diagnosis Date  . Hypertension   . Dysrhythmia     "palpations with full dose of hydrocodone"- takes 1/2 of Hydrocodone- Acetaminophen and does not have palpations  . 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 Hypothyroidism   . GERD (gastroesophageal reflux disease)   . Constipation   . History of hiatal hernia   . Hemorrhoids   . Anemia   . Arthritis   . Headache     occasional    Past Surgical History  Procedure Laterality Date  . Foot surgery Left      Callus- reaset bone  . Abdominal hysterectomy      partial -right ovary remains  . Tonsillectomy    . Cervix removal    . Colonoscopy      History reviewed. No pertinent family history.    Social History:  Lives alone. Has been out of work since Jan due to back pain--has been furniture/wall walking for the past few months. Sister plans on helping about a week or so after discharge. She reports that she has quit smoking 40 years ago She does not have any smokeless tobacco history on file. She reports that she drinks a glass of wine couple of times a week. She reports that she does not use illicit drugs.     Allergies  Allergen Reactions  . Hydrocodone Palpitations  . Penicillins Rash    Has patient had a PCN reaction causing immediate rash, facial/tongue/throat swelling, SOB or lightheadedness with hypotension: Yes Has patient had a PCN reaction causing severe rash involving mucus membranes or skin necrosis: No Has patient had a PCN reaction that required hospitalization No Has patient had a PCN reaction occurring within the last 10 years: No If all of the above answers are "NO", then may proceed with Cephalosporin use.     Medications Prior to Admission  Medication Sig Dispense Refill  . baclofen (LIORESAL) 10 MG tablet Take 10 mg by mouth daily as needed for muscle spasms.   5  . Cholecalciferol (VITAMIN D-3) 1000 units CAPS Take 4,000 Units by mouth daily.    Marland Kitchen  CRESTOR 20 MG tablet Take 20 mg by mouth daily.   6  . gabapentin (NEURONTIN) 300 MG capsule Take 300 mg by mouth 3 (three) times daily.   2  . HYDROcodone-acetaminophen (NORCO/VICODIN) 5-325 MG tablet Take 1 tablet by mouth every 6 (six) hours as needed for moderate pain.   0  . losartan (COZAAR) 100 MG tablet Take 100 mg by mouth daily.   5  . meloxicam (MOBIC) 15 MG tablet Take 15 mg by mouth daily.   0  . NATURE-THROID 16.25 MG TABS Take 16.25 mg by mouth daily.   2  . omeprazole (PRILOSEC OTC) 20 MG tablet  Take 20 mg by mouth daily.    Marland Kitchen OVER THE COUNTER MEDICATION Take 15 mLs by mouth daily. Beet powder supplement    . polyethylene glycol (MIRALAX / GLYCOLAX) packet Take 17 g by mouth daily as needed.    . potassium gluconate 595 (99 K) MG TABS tablet Take 1,100 mg by mouth daily.    Marland Kitchen spironolactone-hydrochlorothiazide (ALDACTAZIDE) 25-25 MG tablet Take 1 tablet by mouth every morning.   5  . UNABLE TO FIND 1 capsule. Green tea- with Reservatrol    . UNABLE TO FIND Beet extract      Home: Home Living Family/patient expects to be discharged to:: Inpatient rehab Living Arrangements: Alone Additional Comments: pt lives alone in 1 story home with 1 STE. pt with sister out of town than can come stay for a week and has supportive church famliy but no one to stay at night. pt has been out of work since January 2017.    Functional History: Prior Function Level of Independence: Independent Comments: was furniture and wall walking  Functional Status:  Mobility: Bed Mobility Overal bed mobility: Needs Assistance Bed Mobility: Rolling, Sidelying to Sit Rolling: Min assist Sidelying to sit: Min assist Sit to supine: Mod assist General bed mobility comments: up in chair Transfers Overall transfer level: Needs assistance Equipment used: Rolling walker (2 wheeled) Transfers: Sit to/from Stand Sit to Stand: Min assist General transfer comment: from recliner.  Ambulation/Gait Ambulation/Gait assistance: Min guard Ambulation Distance (Feet): 50 Feet Assistive device: Rolling walker (2 wheeled) Gait Pattern/deviations: Step-through pattern, Decreased stride length General Gait Details: pt very labored with ambulation due to pain.  cues for encouragement and back precautions during ambulation.   Gait velocity: slow    ADL: ADL Overall ADL's : Needs assistance/impaired Eating/Feeding: Modified independent, Sitting Grooming: Wash/dry face, Wash/dry hands, Oral care, Min guard,  Standing Grooming Details (indicate cue type and reason): sink level with cues for back precautions . pt very guarded movements Upper Body Bathing: Min guard, Sitting, Minimal assitance Upper Body Bathing Details (indicate cue type and reason): assist to wash upper back Lower Body Bathing: Maximal assistance, Sit to/from stand Lower Body Bathing Details (indicate cue type and reason): Pt completed upper leg and some anterior perianal cleaning in sitting position. Therapist washed lower legs and feet and finished perianal cleaning with pt in stnading position. Education on AE for bathing and pericare tasks.  Upper Body Dressing : Set up, Sitting Upper Body Dressing Details (indicate cue type and reason): Pt donned back brace with setup from therapist.  Lower Body Dressing: Moderate assistance, Sit to/from stand Lower Body Dressing Details (indicate cue type and reason): assist to doff/don socks Toilet Transfer: +2 for physical assistance, Minimal assistance, RW Toilet Transfer Details (indicate cue type and reason): cues for hand placement Toileting- Clothing Manipulation and Hygiene: Total assistance Toileting -  Clothing Manipulation Details (indicate cue type and reason): unable to complete peri care due to body habitus Functional mobility during ADLs: Minimal assistance, Rolling walker General ADL Comments: Pt completed UB/LB bathing in sit<>stand as detailed above. Encouraged to complete what she could on her own and discussed which tasks she needed assist with Educated on strategies and AE.  Cognition: Cognition Overall Cognitive Status: Within Functional Limits for tasks assessed Orientation Level: Oriented X4 Cognition Arousal/Alertness: Awake/alert Behavior During Therapy: WFL for tasks assessed/performed Overall Cognitive Status: Within Functional Limits for tasks assessed   Blood pressure 100/47, pulse 94, temperature 98.9 F (37.2 C), temperature source Oral, resp. rate 17,  height 5\' 5"  (1.651 m), weight 138.71 kg (305 lb 12.8 oz), SpO2 98 %. Physical Exam  Nursing note and vitals reviewed. Constitutional: She is oriented to person, place, and time. She appears well-developed and well-nourished. No distress.  Morbidly obese  HENT:  Head: Normocephalic and atraumatic.  Mouth/Throat: Oropharynx is clear and moist.  Eyes: Conjunctivae are normal. Pupils are equal, round, and reactive to light. Right eye exhibits no discharge. Left eye exhibits no discharge.  Neck: Normal range of motion. Neck supple.  Cardiovascular: Normal rate and regular rhythm.   No murmur heard. Respiratory: Effort normal. No stridor. She has no wheezes.  GI: Soft. Bowel sounds are normal. There is no tenderness.  Musculoskeletal: She exhibits edema.  1+ pretibial edema  Neurological: She is alert and oriented to person, place, and time.  UE strength 5/5. LE: 3/5 HF, 4/5 KE and ADF/PF. ?decreased LT over buttocks bilaterally. Otherwise sensation intact.  Skin: Skin is warm and dry. She is not diaphoretic.  Lower back incision with dried blood under honeycomb dressing.   Psychiatric: Her speech is normal. Judgment and thought content normal. Her mood appears anxious. She is slowed. Cognition and memory are normal.    Results for orders placed or performed during the hospital encounter of 03/27/16 (from the past 48 hour(s))  Glucose, capillary     Status: Abnormal   Collection Time: 03/28/16 11:12 AM  Result Value Ref Range   Glucose-Capillary 116 (H) 65 - 99 mg/dL  Glucose, capillary     Status: Abnormal   Collection Time: 03/28/16  5:10 PM  Result Value Ref Range   Glucose-Capillary 103 (H) 65 - 99 mg/dL  Glucose, capillary     Status: Abnormal   Collection Time: 03/28/16 10:01 PM  Result Value Ref Range   Glucose-Capillary 123 (H) 65 - 99 mg/dL   Comment 1 Notify RN    Comment 2 Document in Chart   Glucose, capillary     Status: Abnormal   Collection Time: 03/29/16  6:15 AM   Result Value Ref Range   Glucose-Capillary 102 (H) 65 - 99 mg/dL   Comment 1 Notify RN    Comment 2 Document in Chart   Glucose, capillary     Status: Abnormal   Collection Time: 03/29/16 11:30 AM  Result Value Ref Range   Glucose-Capillary 105 (H) 65 - 99 mg/dL  Glucose, capillary     Status: None   Collection Time: 03/29/16  4:48 PM  Result Value Ref Range   Glucose-Capillary 89 65 - 99 mg/dL  Glucose, capillary     Status: Abnormal   Collection Time: 03/29/16 10:10 PM  Result Value Ref Range   Glucose-Capillary 123 (H) 65 - 99 mg/dL   Comment 1 Notify RN    Comment 2 Document in Chart   Glucose, capillary  Status: None   Collection Time: 03/30/16  7:25 AM  Result Value Ref Range   Glucose-Capillary 85 65 - 99 mg/dL   Comment 1 Notify RN    Comment 2 Document in Chart    No results found.     Medical Problem List and Plan: 1.  Functional and mobility deficits secondary to lumbar spondlylolisthesis and radiculopathy 2.  DVT Prophylaxis/Anticoagulation: Mechanical: Sequential compression devices, below knee Bilateral lower extremities  3. Pain Management: Change hydrocodone (causes palpitations)  to oxycodone prn. Will schedule tramadol for better pain control.   -robaxin prn for spasms 4. Mood: Team to provide ego support. LCSW to follow for evaluation and support.  5. Neuropsych: This patient is capable of making decisions on her own behalf. 6. Skin/Wound Care: Monitor wound daily for healing. Maintain adequate nutritional and hydration status.  7. Fluids/Electrolytes/Nutrition: Discontinue IVF and toradol (reports causing HA?) as contributing to BLE edema.  Monitor I/O. Check lytes in am.  8. T2DM--diet controlled: Continue to monitor BS ac/hs and use SSI for BS tighter blood sugar control.  9. HTN: Monitor BP bid and set parameters as running low. On aldactone, HCTZ--refused by patient yesterday.  Continues on  Cozaar--will hold cozaar for now. Encourage fluid intake.   10 ABLA: Recheck CBC in am. Add iron supplement.  11. Leucocytosis: Monitor for fevers or other signs of infection.       Post Admission Physician Evaluation: 1. Functional deficits secondary  to lumbar spondylolisthesis/radiculopathy. 2. Patient is admitted to receive collaborative, interdisciplinary care between the physiatrist, rehab nursing staff, and therapy team. 3. Patient's level of medical complexity and substantial therapy needs in context of that medical necessity cannot be provided at a lesser intensity of care such as a SNF. 4. Patient has experienced substantial functional loss from his/her baseline which was documented above under the "Functional History" and "Functional Status" headings.  Judging by the patient's diagnosis, physical exam, and functional history, the patient has potential for functional progress which will result in measurable gains while on inpatient rehab.  These gains will be of substantial and practical use upon discharge  in facilitating mobility and self-care at the household level. 5. Physiatrist will provide 24 hour management of medical needs as well as oversight of the therapy plan/treatment and provide guidance as appropriate regarding the interaction of the two. 6. 24 hour rehab nursing will assist with bladder management, bowel management, safety, skin/wound care, disease management, medication administration, pain management and patient education  and help integrate therapy concepts, techniques,education, etc. 7. PT will assess and treat for/with: Lower extremity strength, range of motion, stamina, balance, functional mobility, safety, adaptive techniques and equipment, pain mgt, back precautions, orthosis.   Goals are: mod I. 8. OT will assess and treat for/with: ADL's, functional mobility, safety, upper extremity strength, adaptive techniques and equipment, pain mgt, don/doff LSO, ego support, community reintegration.   Goals are: mod I. Therapy may  proceed with showering this patient. 9. SLP will assess and treat for/with: n/a.  Goals are: n/a. 10. Case Management and Social Worker will assess and treat for psychological issues and discharge planning. 11. Team conference will be held weekly to assess progress toward goals and to determine barriers to discharge. 12. Patient will receive at least 3 hours of therapy per day at least 5 days per week. 13. ELOS: 7 days       14. Prognosis:  excellent     Meredith Staggers, MD, [place]  03/30/2016   03/30/2016 

## 2016-03-30 NOTE — Progress Notes (Signed)
OT  Note  Patient Details Name: Sheri Baker MRN: [id number] DOB: 1951/09/17   Cancelled Treatment:    Reason Eval/Treat Not Completed: Other (comment) (pending admission to CIR today per CM Genie) Ot to defer all further therapy to CIR.   Vonita Moss   OTR/L Pager: [phone] Office: [phone] .  03/30/2016, 1:40 PM

## 2016-03-30 NOTE — Progress Notes (Signed)
Rehab admissions - I have authorization for acute inpatient rehab admission from [facility].  Bed available on rehab and will admit to acute inpatient rehab today.  Call me for questions.  [phone]

## 2016-03-30 NOTE — Care Management Note (Signed)
Case Management Note  Patient Details  Name: Dazie Suhre MRN: [id number] Date of Birth: 1951-09-17  Subjective/Objective:                    Action/Plan: Patient discharging to CIR today. No further needs per CM.   Expected Discharge Date:                  Expected Discharge Plan:  [place]  In-House Referral:     Discharge planning Services  CM Consult  Post Acute Care Choice:    Choice offered to:     DME Arranged:    DME Agency:     HH Arranged:    [place] Agency:     Status of Service:  Completed, signed off  Medicare Important Message Given:    Date Medicare IM Given:    Medicare IM give by:    Date Additional Medicare IM Given:    Additional Medicare Important Message give by:     If discussed at [place] of Stay Meetings, dates discussed:    Additional Comments:  Pollie Friar, RN 03/30/2016, 3:02 PM
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
## 2016-03-30 NOTE — Interval H&P Note (Signed)
Sheri Baker was admitted today to Inpatient Rehabilitation with the diagnosis of lumbar spondylolisthesis/radiculopathy.  The patient's history has been reviewed, patient examined, and there is no change in status.  Patient continues to be appropriate for intensive inpatient rehabilitation.  I have reviewed the patient's chart and labs.  Questions were answered to the patient's satisfaction. The PAPE has been reviewed and assessment remains appropriate.  SWARTZ,ZACHARY T 03/30/2016, 10:20 PM

## 2016-03-30 NOTE — H&P (View-Only) (Signed)
Physical Medicine and Rehabilitation Admission H&P    CC: Lumbar spondylolisthesis with stenosis and radiculopathy.   HPI:   Sheri Baker is a 65 y.o. 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 Patient elected to undergo decompressive laminectomy L4-L5, L5/S1 with decompression of L4, L5 and S1 nerve roots on 03/27/16 by Dr. Ellene Route. She continued to have issues with pain control and was started on Toredol to help manage symptoms. Post op with ABLA and leucocytosis with WBC up to 13.6. Therapy ongoing and patient limited by pain as well as back precautions. CIR recommended for follow up therapy.    Review of Systems  HENT: Negative for hearing loss.   Eyes: Negative for blurred vision and double vision.  Respiratory: Positive for shortness of breath. Negative for cough and wheezing.   Cardiovascular: Negative for chest pain and palpitations.  Gastrointestinal: Negative for heartburn, nausea, abdominal pain and constipation.  Genitourinary: Negative for urgency and frequency.  Musculoskeletal: Positive for myalgias and back pain.  Skin: Negative for itching and rash.  Neurological: Positive for dizziness, sensory change, focal weakness, weakness and headaches.  Psychiatric/Behavioral: The patient is nervous/anxious and has insomnia.       Past Medical History  Diagnosis Date  . Hypertension   . Dysrhythmia     "palpations with full dose of hydrocodone"- takes 1/2 of Hydrocodone- Acetaminophen and does not have palpations  . 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 Hypothyroidism   . GERD (gastroesophageal reflux disease)   . Constipation   . History of hiatal hernia   . Hemorrhoids   . Anemia   . Arthritis   . Headache     occasional    Past Surgical History  Procedure Laterality Date  . Foot surgery Left      Callus- reaset bone  . Abdominal hysterectomy      partial -right ovary remains  . Tonsillectomy    . Cervix removal    . Colonoscopy      History reviewed. No pertinent family history.    Social History:  Lives alone. Has been out of work since Jan due to back pain--has been furniture/wall walking for the past few months. Sister plans on helping about a week or so after discharge. She reports that she has quit smoking 40 years ago She does not have any smokeless tobacco history on file. She reports that she drinks a glass of wine couple of times a week. She reports that she does not use illicit drugs.     Allergies  Allergen Reactions  . Hydrocodone Palpitations  . Penicillins Rash    Has patient had a PCN reaction causing immediate rash, facial/tongue/throat swelling, SOB or lightheadedness with hypotension: Yes Has patient had a PCN reaction causing severe rash involving mucus membranes or skin necrosis: No Has patient had a PCN reaction that required hospitalization No Has patient had a PCN reaction occurring within the last 10 years: No If all of the above answers are "NO", then may proceed with Cephalosporin use.     Medications Prior to Admission  Medication Sig Dispense Refill  . baclofen (LIORESAL) 10 MG tablet Take 10 mg by mouth daily as needed for muscle spasms.   5  . Cholecalciferol (VITAMIN D-3) 1000 units CAPS Take 4,000 Units by mouth daily.    Marland Kitchen  CRESTOR 20 MG tablet Take 20 mg by mouth daily.   6  . gabapentin (NEURONTIN) 300 MG capsule Take 300 mg by mouth 3 (three) times daily.   2  . HYDROcodone-acetaminophen (NORCO/VICODIN) 5-325 MG tablet Take 1 tablet by mouth every 6 (six) hours as needed for moderate pain.   0  . losartan (COZAAR) 100 MG tablet Take 100 mg by mouth daily.   5  . meloxicam (MOBIC) 15 MG tablet Take 15 mg by mouth daily.   0  . NATURE-THROID 16.25 MG TABS Take 16.25 mg by mouth daily.   2  . omeprazole (PRILOSEC OTC) 20 MG tablet  Take 20 mg by mouth daily.    Marland Kitchen OVER THE COUNTER MEDICATION Take 15 mLs by mouth daily. Beet powder supplement    . polyethylene glycol (MIRALAX / GLYCOLAX) packet Take 17 g by mouth daily as needed.    . potassium gluconate 595 (99 K) MG TABS tablet Take 1,100 mg by mouth daily.    Marland Kitchen spironolactone-hydrochlorothiazide (ALDACTAZIDE) 25-25 MG tablet Take 1 tablet by mouth every morning.   5  . UNABLE TO FIND 1 capsule. Green tea- with Reservatrol    . UNABLE TO FIND Beet extract      Home: Home Living Family/patient expects to be discharged to:: Inpatient rehab Living Arrangements: Alone Additional Comments: pt lives alone in 1 story home with 1 STE. pt with sister out of town than can come stay for a week and has supportive church famliy but no one to stay at night. pt has been out of work since January 2017.    Functional History: Prior Function Level of Independence: Independent Comments: was furniture and wall walking  Functional Status:  Mobility: Bed Mobility Overal bed mobility: Needs Assistance Bed Mobility: Rolling, Sidelying to Sit Rolling: Min assist Sidelying to sit: Min assist Sit to supine: Mod assist General bed mobility comments: up in chair Transfers Overall transfer level: Needs assistance Equipment used: Rolling walker (2 wheeled) Transfers: Sit to/from Stand Sit to Stand: Min assist General transfer comment: from recliner.  Ambulation/Gait Ambulation/Gait assistance: Min guard Ambulation Distance (Feet): 50 Feet Assistive device: Rolling walker (2 wheeled) Gait Pattern/deviations: Step-through pattern, Decreased stride length General Gait Details: pt very labored with ambulation due to pain.  cues for encouragement and back precautions during ambulation.   Gait velocity: slow    ADL: ADL Overall ADL's : Needs assistance/impaired Eating/Feeding: Modified independent, Sitting Grooming: Wash/dry face, Wash/dry hands, Oral care, Min guard,  Standing Grooming Details (indicate cue type and reason): sink level with cues for back precautions . pt very guarded movements Upper Body Bathing: Min guard, Sitting, Minimal assitance Upper Body Bathing Details (indicate cue type and reason): assist to wash upper back Lower Body Bathing: Maximal assistance, Sit to/from stand Lower Body Bathing Details (indicate cue type and reason): Pt completed upper leg and some anterior perianal cleaning in sitting position. Therapist washed lower legs and feet and finished perianal cleaning with pt in stnading position. Education on AE for bathing and pericare tasks.  Upper Body Dressing : Set up, Sitting Upper Body Dressing Details (indicate cue type and reason): Pt donned back brace with setup from therapist.  Lower Body Dressing: Moderate assistance, Sit to/from stand Lower Body Dressing Details (indicate cue type and reason): assist to doff/don socks Toilet Transfer: +2 for physical assistance, Minimal assistance, RW Toilet Transfer Details (indicate cue type and reason): cues for hand placement Toileting- Clothing Manipulation and Hygiene: Total assistance Toileting -  Clothing Manipulation Details (indicate cue type and reason): unable to complete peri care due to body habitus Functional mobility during ADLs: Minimal assistance, Rolling walker General ADL Comments: Pt completed UB/LB bathing in sit<>stand as detailed above. Encouraged to complete what she could on her own and discussed which tasks she needed assist with Educated on strategies and AE.  Cognition: Cognition Overall Cognitive Status: Within Functional Limits for tasks assessed Orientation Level: Oriented X4 Cognition Arousal/Alertness: Awake/alert Behavior During Therapy: WFL for tasks assessed/performed Overall Cognitive Status: Within Functional Limits for tasks assessed   Blood pressure 100/47, pulse 94, temperature 98.9 F (37.2 C), temperature source Oral, resp. rate 17,  height 5\' 5"  (1.651 m), weight 138.71 kg (305 lb 12.8 oz), SpO2 98 %. Physical Exam  Nursing note and vitals reviewed. Constitutional: She is oriented to person, place, and time. She appears well-developed and well-nourished. No distress.  Morbidly obese  HENT:  Head: Normocephalic and atraumatic.  Mouth/Throat: Oropharynx is clear and moist.  Eyes: Conjunctivae are normal. Pupils are equal, round, and reactive to light. Right eye exhibits no discharge. Left eye exhibits no discharge.  Neck: Normal range of motion. Neck supple.  Cardiovascular: Normal rate and regular rhythm.   No murmur heard. Respiratory: Effort normal. No stridor. She has no wheezes.  GI: Soft. Bowel sounds are normal. There is no tenderness.  Musculoskeletal: She exhibits edema.  1+ pretibial edema  Neurological: She is alert and oriented to person, place, and time.  UE strength 5/5. LE: 3/5 HF, 4/5 KE and ADF/PF. ?decreased LT over buttocks bilaterally. Otherwise sensation intact.  Skin: Skin is warm and dry. She is not diaphoretic.  Lower back incision with dried blood under honeycomb dressing.   Psychiatric: Her speech is normal. Judgment and thought content normal. Her mood appears anxious. She is slowed. Cognition and memory are normal.    Results for orders placed or performed during the hospital encounter of 03/27/16 (from the past 48 hour(s))  Glucose, capillary     Status: Abnormal   Collection Time: 03/28/16 11:12 AM  Result Value Ref Range   Glucose-Capillary 116 (H) 65 - 99 mg/dL  Glucose, capillary     Status: Abnormal   Collection Time: 03/28/16  5:10 PM  Result Value Ref Range   Glucose-Capillary 103 (H) 65 - 99 mg/dL  Glucose, capillary     Status: Abnormal   Collection Time: 03/28/16 10:01 PM  Result Value Ref Range   Glucose-Capillary 123 (H) 65 - 99 mg/dL   Comment 1 Notify RN    Comment 2 Document in Chart   Glucose, capillary     Status: Abnormal   Collection Time: 03/29/16  6:15 AM   Result Value Ref Range   Glucose-Capillary 102 (H) 65 - 99 mg/dL   Comment 1 Notify RN    Comment 2 Document in Chart   Glucose, capillary     Status: Abnormal   Collection Time: 03/29/16 11:30 AM  Result Value Ref Range   Glucose-Capillary 105 (H) 65 - 99 mg/dL  Glucose, capillary     Status: None   Collection Time: 03/29/16  4:48 PM  Result Value Ref Range   Glucose-Capillary 89 65 - 99 mg/dL  Glucose, capillary     Status: Abnormal   Collection Time: 03/29/16 10:10 PM  Result Value Ref Range   Glucose-Capillary 123 (H) 65 - 99 mg/dL   Comment 1 Notify RN    Comment 2 Document in Chart   Glucose, capillary  Status: None   Collection Time: 03/30/16  7:25 AM  Result Value Ref Range   Glucose-Capillary 85 65 - 99 mg/dL   Comment 1 Notify RN    Comment 2 Document in Chart    No results found.     Medical Problem List and Plan: 1.  Functional and mobility deficits secondary to lumbar spondlylolisthesis and radiculopathy 2.  DVT Prophylaxis/Anticoagulation: Mechanical: Sequential compression devices, below knee Bilateral lower extremities  3. Pain Management: Change hydrocodone (causes palpitations)  to oxycodone prn. Will schedule tramadol for better pain control.   -robaxin prn for spasms 4. Mood: Team to provide ego support. LCSW to follow for evaluation and support.  5. Neuropsych: This patient is capable of making decisions on her own behalf. 6. Skin/Wound Care: Monitor wound daily for healing. Maintain adequate nutritional and hydration status.  7. Fluids/Electrolytes/Nutrition: Discontinue IVF and toradol (reports causing HA?) as contributing to BLE edema.  Monitor I/O. Check lytes in am.  8. T2DM--diet controlled: Continue to monitor BS ac/hs and use SSI for BS tighter blood sugar control.  9. HTN: Monitor BP bid and set parameters as running low. On aldactone, HCTZ--refused by patient yesterday.  Continues on  Cozaar--will hold cozaar for now. Encourage fluid intake.   10 ABLA: Recheck CBC in am. Add iron supplement.  11. Leucocytosis: Monitor for fevers or other signs of infection.       Post Admission Physician Evaluation: 1. Functional deficits secondary  to lumbar spondylolisthesis/radiculopathy. 2. Patient is admitted to receive collaborative, interdisciplinary care between the physiatrist, rehab nursing staff, and therapy team. 3. Patient's level of medical complexity and substantial therapy needs in context of that medical necessity cannot be provided at a lesser intensity of care such as a SNF. 4. Patient has experienced substantial functional loss from his/her baseline which was documented above under the "Functional History" and "Functional Status" headings.  Judging by the patient's diagnosis, physical exam, and functional history, the patient has potential for functional progress which will result in measurable gains while on inpatient rehab.  These gains will be of substantial and practical use upon discharge  in facilitating mobility and self-care at the household level. 5. Physiatrist will provide 24 hour management of medical needs as well as oversight of the therapy plan/treatment and provide guidance as appropriate regarding the interaction of the two. 6. 24 hour rehab nursing will assist with bladder management, bowel management, safety, skin/wound care, disease management, medication administration, pain management and patient education  and help integrate therapy concepts, techniques,education, etc. 7. PT will assess and treat for/with: Lower extremity strength, range of motion, stamina, balance, functional mobility, safety, adaptive techniques and equipment, pain mgt, back precautions, orthosis.   Goals are: mod I. 8. OT will assess and treat for/with: ADL's, functional mobility, safety, upper extremity strength, adaptive techniques and equipment, pain mgt, don/doff LSO, ego support, community reintegration.   Goals are: mod I. Therapy may  proceed with showering this patient. 9. SLP will assess and treat for/with: n/a.  Goals are: n/a. 10. Case Management and Social Worker will assess and treat for psychological issues and discharge planning. 11. Team conference will be held weekly to assess progress toward goals and to determine barriers to discharge. 12. Patient will receive at least 3 hours of therapy per day at least 5 days per week. 13. ELOS: 7 days       14. Prognosis:  excellent     Meredith Staggers, MD, [place]  03/30/2016   03/30/2016 

## 2016-03-30 NOTE — Discharge Summary (Signed)
Physician Discharge Summary  Patient ID: Sheri Baker MRN: [id number] DOB/AGE: 66/14/1952 66 y.o.  Admit date: 03/27/2016 Discharge date: 03/30/2016  Admission Diagnoses:Lumbar stenosis and spondylolisthesis L4-5 and L5-S1, lumbar radiculopathy. Diabetes mellitus acute blood loss anemia  Discharge Diagnoses: Lumbar stenosis and spondylolisthesis L4-5 and L5-S1 lumbar radiculopathy. Diabetes mellitus Active Problems:   Spondylolisthesis of lumbar region   Discharged Condition: fair  Hospital Course: Patient is a 65 year old individual who's had severe back pain bilateral lower extremity pain. She's had decompression of her lower lumbar spine at L4-5 and L5-S1. She's been improving steadily however require significant help with activities of daily living and is felt to be a good candidate for physical therapy. She is admitted for CIR.  Consults: None  Significant Diagnostic Studies: None  Treatments: surgery: Decompression L4-5 and L5-S1 with posterior lumbar interbody arthrodesis local autograft and allograft segmental fixation L4 to sacrum  Discharge Exam: Blood pressure 121/52, pulse 102, temperature 98.2 F (36.8 C), temperature source Oral, resp. rate 19, height 5\' 5"  (1.651 m), weight 138.71 kg (305 lb 12.8 oz), SpO2 98 %. Incision is clean and dry, motor function is intact in lower extremities.  Disposition: Transfer to rehabilitation center for comprehensive inpatient rehabilitation  Discharge Instructions    Call MD for:  redness, tenderness, or signs of infection (pain, swelling, redness, odor or green/yellow discharge around incision site)    Complete by:  As directed      Call MD for:  severe uncontrolled pain    Complete by:  As directed      Call MD for:  temperature >100.4    Complete by:  As directed      Diet - low sodium heart healthy    Complete by:  As directed      Increase activity slowly    Complete by:  As directed             Medication List    TAKE  these medications        baclofen 10 MG tablet  Commonly known as:  LIORESAL  Take 10 mg by mouth daily as needed for muscle spasms.     CRESTOR 20 MG tablet  Generic drug:  rosuvastatin  Take 20 mg by mouth daily.     gabapentin 300 MG capsule  Commonly known as:  NEURONTIN  Take 300 mg by mouth 3 (three) times daily.     HYDROcodone-acetaminophen 5-325 MG tablet  Commonly known as:  NORCO/VICODIN  Take 1 tablet by mouth every 6 (six) hours as needed for moderate pain.     losartan 100 MG tablet  Commonly known as:  COZAAR  Take 100 mg by mouth daily.     meloxicam 15 MG tablet  Commonly known as:  MOBIC  Take 15 mg by mouth daily.     NATURE-THROID 16.25 MG Tabs  Generic drug:  Thyroid  Take 16.25 mg by mouth daily.     omeprazole 20 MG tablet  Commonly known as:  PRILOSEC OTC  Take 20 mg by mouth daily.     OVER THE COUNTER MEDICATION  Take 15 mLs by mouth daily. Beet powder supplement     polyethylene glycol packet  Commonly known as:  MIRALAX / GLYCOLAX  Take 17 g by mouth daily as needed.     potassium gluconate 595 (99 K) MG Tabs tablet  Take 1,100 mg by mouth daily.     spironolactone-hydrochlorothiazide 25-25 MG tablet  Commonly known as:  ALDACTAZIDE  Take  1 tablet by mouth every morning.     UNABLE TO FIND  1 capsule. Green tea- with Reservatrol     UNABLE TO FIND  Beet extract     Vitamin D-3 1000 units Caps  Take 4,000 Units by mouth daily.           Follow-up Information    Follow up with [facility] .   Specialty:  [place] information:   [address] [place] [id number]      Signed: Earleen Newport 03/30/2016, 5:09 PM

## 2016-03-31 ENCOUNTER — Inpatient Hospital Stay (HOSPITAL_COMMUNITY): Payer: BLUE CROSS/BLUE SHIELD

## 2016-03-31 ENCOUNTER — Inpatient Hospital Stay (HOSPITAL_COMMUNITY): Payer: BLUE CROSS/BLUE SHIELD | Admitting: Physical Therapy

## 2016-03-31 DIAGNOSIS — D62 Acute posthemorrhagic anemia: Secondary | ICD-10-CM

## 2016-03-31 LAB — GLUCOSE, CAPILLARY
Glucose-Capillary: 96 mg/dL (ref 65–99)
Glucose-Capillary: 106 mg/dL — ABNORMAL HIGH (ref 65–99)
Glucose-Capillary: 83 mg/dL (ref 65–99)
Glucose-Capillary: 121 mg/dL — ABNORMAL HIGH (ref 65–99)

## 2016-03-31 LAB — COMPREHENSIVE METABOLIC PANEL
ALT: 18 U/L (ref 14–54)
AST: 18 U/L (ref 15–41)
Albumin: 2.4 g/dL — ABNORMAL LOW (ref 3.5–5.0)
Alkaline Phosphatase: 53 U/L (ref 38–126)
Anion gap: 7 (ref 5–15)
BUN: 10 mg/dL (ref 6–20)
CO2: 25 mmol/L (ref 22–32)
Calcium: 8.3 mg/dL — ABNORMAL LOW (ref 8.9–10.3)
Chloride: 108 mmol/L (ref 101–111)
Creatinine, Ser: 0.75 mg/dL (ref 0.44–1.00)
GFR calc Af Amer: >60 mL/min (ref >60)
GFR calc non Af Amer: >60 mL/min (ref >60)
Glucose, Bld: 111 mg/dL — ABNORMAL HIGH (ref 65–99)
Potassium: 3.5 mmol/L (ref 3.5–5.1)
Sodium: 140 mmol/L (ref 135–145)
Total Bilirubin: 0.6 mg/dL (ref 0.3–1.2)
Total Protein: 5.9 g/dL — ABNORMAL LOW (ref 6.5–8.1)

## 2016-03-31 LAB — CBC WITH DIFFERENTIAL/PLATELET
Basophils Absolute: 0 10*3/uL (ref 0.0–0.1)
Basophils Relative: 0 %
Eosinophils Absolute: 0.1 10*3/uL (ref 0.0–0.7)
Eosinophils Relative: 1 %
HCT: 29.8 % — ABNORMAL LOW (ref 36.0–46.0)
Hemoglobin: 9.4 g/dL — ABNORMAL LOW (ref 12.0–15.0)
Lymphocytes Relative: 29 %
Lymphs Abs: 2.5 10*3/uL (ref 0.7–4.0)
MCH: 27.1 pg (ref 26.0–34.0)
MCHC: 31.5 g/dL (ref 30.0–36.0)
MCV: 85.9 fL (ref 78.0–100.0)
Monocytes Absolute: 0.8 10*3/uL (ref 0.1–1.0)
Monocytes Relative: 9 %
Neutro Abs: 5.2 10*3/uL (ref 1.7–7.7)
Neutrophils Relative %: 61 %
Platelets: 228 10*3/uL (ref 150–400)
RBC: 3.47 MIL/uL — ABNORMAL LOW (ref 3.87–5.11)
RDW: 14 % (ref 11.5–15.5)
WBC: 8.6 10*3/uL (ref 4.0–10.5)

## 2016-03-31 LAB — URINALYSIS, ROUTINE W REFLEX MICROSCOPIC
Bilirubin Urine: NEGATIVE
Glucose, UA: NEGATIVE mg/dL
Hgb urine dipstick: NEGATIVE
Ketones, ur: NEGATIVE mg/dL
Leukocytes, UA: NEGATIVE
Nitrite: NEGATIVE
Protein, ur: NEGATIVE mg/dL
Specific Gravity, Urine: 1.02 (ref 1.005–1.030)
pH: 7 (ref 5.0–8.0)

## 2016-03-31 NOTE — Evaluation (Signed)
Occupational Therapy Assessment and Plan  Patient Details  Name: Sheri Baker MRN: [id number] Date of Birth: 08/14/1951  OT Diagnosis: acute pain, lumbago (low back pain) and muscle weakness (generalized) Rehab Potential: Rehab Potential (ACUTE ONLY): Good ELOS: 7-10 days   Today's Date: 03/31/2016 OT Individual Time: 1100-1200 OT Individual Time Calculation (min): 60 min     Problem List:  Patient Active Problem List   Diagnosis Date Noted  . Spinal stenosis of lumbar region with radiculopathy 03/30/2016  . Spondylolisthesis of lumbar region 03/27/2016    Past Medical History:  Past Medical History  Diagnosis Date  . Hypertension   . Dysrhythmia     "palpations with full dose of hydrocodone"- takes 1/2 of Hydrocodone- Acetaminophen and does not have palpations  . 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 Hypothyroidism   . GERD (gastroesophageal reflux disease)   . Constipation   . History of hiatal hernia   . Hemorrhoids   . Anemia   . Arthritis   . Headache     occasional   Past Surgical History:  Past Surgical History  Procedure Laterality Date  . Foot surgery Left     Callus- reaset bone  . Abdominal hysterectomy      partial -right ovary remains  . Tonsillectomy    . Cervix removal    . Colonoscopy      Assessment & Plan Clinical Impression: Patient is a 65 y.o. 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 Patient elected to undergo decompressive laminectomy L4-L5, L5/S1 with decompression of L4, L5 and S1 nerve roots on 03/27/16 by Dr. Ellene Route. She continued to have issues with pain control and was started on Toredol to help manage symptoms. Post op with ABLA and leucocytosis with WBC up to 13.6. Therapy ongoing and patient limited by pain as well as back precautions..  Patient transferred to CIR  on 03/30/2016 .    Patient currently requires moderate assistance with basic self-care skills secondary to muscle weakness.  Prior to hospitalization, patient could complete BADL with modified independence.  Patient will benefit from skilled intervention to increase independence with basic self-care skills and increase level of independence with iADL prior to discharge home independently.  Anticipate patient will require intermittent supervision and follow up home health.  OT - End of Session Activity Tolerance: Tolerates 30+ min activity with multiple rests Endurance Deficit: Yes OT Assessment Rehab Potential (ACUTE ONLY): Good Barriers to Discharge: Decreased caregiver support OT Patient demonstrates impairments in the following area(s): Balance;Pain;Safety;Endurance;Motor OT Basic ADL's Functional Problem(s): Grooming;Bathing;Dressing;Toileting OT Advanced ADL's Functional Problem(s): Simple Meal Preparation;Laundry;Light Housekeeping OT Transfers Functional Problem(s): Toilet;Tub/Shower OT Additional Impairment(s): None OT Plan OT Intensity: Minimum of 1-2 x/day, 45 to 90 minutes OT Frequency: 5 out of 7 days OT Duration/Estimated Length of Stay: 7-10 days OT Treatment/Interventions: Balance/vestibular training;Self Care/advanced ADL retraining;Functional mobility training;Pain management;Therapeutic Activities;Therapeutic Exercise;DME/adaptive equipment instruction;Discharge planning OT Self Feeding Anticipated Outcome(s): Independent OT Basic Self-Care Anticipated Outcome(s): Mod I OT Toileting Anticipated Outcome(s): Mod I OT Bathroom Transfers Anticipated Outcome(s): Mod I OT Recommendation Patient destination: Home Follow Up Recommendations: Home health OT Equipment Recommended: Tub/shower bench   Skilled Therapeutic Intervention OT initial evaluation completed with treatment provided to focus on re-ed on back precautions, methods and goals of treatment, instruction on use of  AE (reacher, LH sponge and sock aid),  and functional transfers and mobility using RW.   Pt required extra time to perform tasks d/t weakness and pain.   Pt able to perform transfers and toileting only during session with plan to perform shower level bathing in the afternoon.    Pt presented with no clothing but reports her sister and brother will be visiting in the late afternoon and will bring clothing with them for subsequent treatments.   OT Evaluation Precautions/Restrictions  Precautions Precautions: Back Precaution Booklet Issued: Yes (comment) Precaution Comments: Pt recalled 3 of 3 (no lifting vs on arching) Required Braces or Orthoses: Spinal Brace Spinal Brace: Lumbar corset;Applied in sitting position Restrictions Weight Bearing Restrictions: No  General Chart Reviewed: Yes Family/Caregiver Present: No  Vital Signs  Pain Pain Assessment Pain Assessment: 0-10 Pain Score: 4  Pain Type: Acute pain Pain Location: Back Pain Orientation: Lower;Posterior;Medial Pain Descriptors / Indicators: Burning;Aching Pain Onset: On-going Pain Intervention(s): Medication (See eMAR) Multiple Pain Sites: No  Home Living/Prior Functioning Home Living Available Help at Discharge: Family (sister plans to reside w/pt temporarilty) Type of Home: House Home Access: Stairs to enter Technical brewer of Steps: 2 Entrance Stairs-Rails: None Home Layout: One level, Other (Comment) Bathroom Shower/Tub: Chiropodist: Handicapped height Bathroom Accessibility: Yes Additional Comments: Sister will be comeing to stay with patient on 6/2  Lives With: Alone IADL History Homemaking Responsibilities: Yes Meal Prep Responsibility: Primary Laundry Responsibility: Primary Cleaning Responsibility: Primary Bill Paying/Finance Responsibility: Primary Shopping Responsibility: Primary Child Care Responsibility: No Current License: Yes Mode of Transportation: Car Education:  HS, + pharmacy tech certified Occupation: Full time employment Type of Occupation: [employer] 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  Leisure: Hobbies-yes (Comment) Comments: watching TV. active in church.   ADL ADL ADL Comments: see Assessment Tool  Vision/Perception  Vision- History Baseline Vision/History: Wears glasses Wears Glasses: Reading only Patient Visual Report: No change from baseline Vision- Assessment Vision Assessment?: No apparent visual deficits   Cognition Overall Cognitive Status: Within Functional Limits for tasks assessed Arousal/Alertness: Awake/alert Orientation Level: Person;Place;Situation Person: Oriented Place: Oriented Situation: Oriented Year: 2017 Month: June Day of Week: Correct Memory: Appears intact Immediate Memory Recall: Sock;Blue;Bed Memory Recall: Sock;Blue;Bed Memory Recall Sock: With Cue Memory Recall Blue: Without Cue Memory Recall Bed: Without Cue Awareness: Appears intact Problem Solving: Appears intact Safety/Judgment: Appears intact  Sensation Sensation Light Touch: Appears Intact Stereognosis: Appears Intact Hot/Cold: Appears Intact Proprioception: Appears Intact Additional Comments: Patiet reports mild burning in Lateral and anterior proximal R LE.  Coordination Gross Motor Movements are Fluid and Coordinated: Yes Fine Motor Movements are Fluid and Coordinated: Yes  Motor  Motor Motor: Within Functional Limits Motor - Skilled Clinical Observations: mild strength deficits secondary to pain .   Mobility  Bed Mobility Bed Mobility: Rolling Right;Rolling  Left;Supine to Sit;Sit to Supine Rolling Right: 4: Min assist Rolling Left: 4: Min assist Supine to Sit: 3: Mod assist Sit to Supine: 3: Mod assist Transfers Sit to Stand: 4: Min assist   Trunk/Postural Assessment  Cervical Assessment Cervical Assessment: Within Functional Limits Thoracic Assessment Thoracic Assessment: Exceptions to [facility] (increased kyphosis) Lumbar Assessment Lumbar Assessment: Exceptions to [facility] (decreased lodorsis and posterior  pelvic titl) Postural Control Postural Control: Deficits on evaluation   Balance Balance Balance Assessed: Yes Static Sitting Balance Static Sitting - Level of Assistance: 6: Modified independent (Device/Increase time) Dynamic Sitting Balance Dynamic Sitting - Level of Assistance: 5: Stand by assistance Static Standing Balance Static Standing - Level of Assistance: 5: Stand by assistance (BUE support) Dynamic Standing Balance Dynamic Standing - Level of Assistance: 4: Min assist (BUE support )  Extremity/Trunk Assessment RUE Assessment RUE Assessment: Within Functional Limits LUE Assessment LUE Assessment: Within Functional Limits   See Function Navigator for Current Functional Status.   Refer to Care Plan for Long Term Goals  Recommendations for other services: None  Discharge Criteria: Patient will be discharged from OT if patient refuses treatment 3 consecutive times without medical reason, if treatment goals not met, if there is a change in medical status, if patient makes no progress towards goals or if patient is discharged from hospital.  The above assessment, treatment plan, treatment alternatives and goals were discussed and mutually agreed upon: by patient   Second session: Time: 1500-1550 Time Calculation (min):  50 min  Pain Assessment:4/10, low back  Skilled Therapeutic Interventions: ADL-retraining at shower level with focus on transfers, adherence to back precautions during performance of ADL, adapted  bathing skills, and AE training.   Pt receptive for planned showering only during session but required extra time to due to need to toilet again.   Pt completed transfer from recliner to w/c for escort to bathroom and transferred with steadying assist.  Pt toileted unassisted to void urine.   Pt required vc for technique of stand-pivot from toilet to RW, to ambulate to shower and transfer to tub bench.  After setup to remove LSO and place water barrier over incision, pt bathed using LH sponge for LE although neglecting her feet d/t poor positioning offered by shower stall and barrier of habitus (obesity).   Pt was educated on technique but unable to perform lateral lean to wash buttocks while seated and was re-educated on need to bathe buttocks separately while standing at sink prior to showering.  Pt was assisted at sink by OT and requested return to bed at end of session, with need for overall mod assist for bed mobility d/t fatigue.   See FIM for current functional status  Therapy/Group: Individual Therapy  [place] 03/31/2016, 12:52 PM

## 2016-03-31 NOTE — Evaluation (Signed)
Physical Therapy Assessment and Plan  Patient Details  Name: Allex Madia MRN: [id number] Date of Birth: [birth year]  PT Diagnosis: Difficulty walking, Low back pain, Muscle weakness and Pain in Low back  Rehab Potential: Good ELOS: 8-12days    Today's Date: 03/31/2016 PT Individual Time: 3524-8185 PT Individual Time Calculation (min): 76 min    Problem List:  Patient Active Problem List   Diagnosis Date Noted  . Spinal stenosis of lumbar region with radiculopathy 03/30/2016  . Spondylolisthesis of lumbar region 03/27/2016    Past Medical History:  Past Medical History  Diagnosis Date  . Hypertension   . Dysrhythmia     "palpations with full dose of hydrocodone"- takes 1/2 of Hydrocodone- Acetaminophen and does not have palpations  . 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 Hypothyroidism   . GERD (gastroesophageal reflux disease)   . Constipation   . History of hiatal hernia   . Hemorrhoids   . Anemia   . Arthritis   . Headache     occasional   Past Surgical History:  Past Surgical History  Procedure Laterality Date  . Foot surgery Left     Callus- reaset bone  . Abdominal hysterectomy      partial -right ovary remains  . Tonsillectomy    . Cervix removal    . Colonoscopy      Assessment & Plan Clinical Impression: Patient is a 65 y.o. 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 Patient elected to undergo decompressive laminectomy L4-L5, L5/S1 with decompression of L4, L5 and S1 nerve roots on 03/27/16 by Dr. Ellene Route. She continued to have issues with pain control and was started on Toredol to help manage symptoms. Post op with ABLA and leucocytosis with WBC up to 13.6.   Patient transferred to CIR on 03/30/2016 .   Patient currently requires min with mobility secondary to muscle weakness, decreased  cardiorespiratoy endurance and decreased standing balance, decreased balance strategies and difficulty maintaining precautions.  Prior to hospitalization, patient was modified independent  with mobility and lived with Alone in a House home.  Home access is 2Stairs to enter.  Patient will benefit from skilled PT intervention to maximize safe functional mobility, minimize fall risk and decrease caregiver burden for planned discharge home with intermittent assist.  Anticipate patient will benefit from follow up [facility] at discharge.  PT - End of Session Activity Tolerance: Tolerates 30+ min activity with multiple rests Endurance Deficit: Yes PT Assessment Rehab Potential (ACUTE/IP ONLY): Good Barriers to Discharge: Inaccessible home environment;Decreased caregiver support Barriers to Discharge Comments: no 24 hour care until 6/21 PT Patient demonstrates impairments in the following area(s): Balance;Endurance;Pain;Safety PT Transfers Functional Problem(s): Bed Mobility;Bed to Chair;Car;Furniture PT Locomotion Functional Problem(s): Ambulation;Wheelchair Mobility;Stairs PT Plan PT Intensity: Minimum of 1-2 x/day ,45 to 90 minutes PT Frequency: 5 out of 7 days PT Duration Estimated Length of Stay: 8-12days  PT Treatment/Interventions: Ambulation/gait training;Balance/vestibular training;Cognitive remediation/compensation;Community reintegration;Discharge planning;Disease management/prevention;Functional mobility training;Neuromuscular re-education;Patient/family education;Pain management;Psychosocial support;Skin care/wound management;Splinting/orthotics;Stair training;Therapeutic Activities;Therapeutic Exercise;UE/LE Strength taining/ROM;UE/LE Coordination activities;Visual/perceptual remediation/compensation;Wheelchair propulsion/positioning PT Transfers Anticipated Outcome(s): Mod I with RW PT Locomotion Anticipated Outcome(s): Mod I- supervision with RW PT Recommendation Follow Up Recommendations:  Home health PT Patient destination: Home Equipment Recommended: Rolling walker with 5" wheels;Wheelchair (measurements);Wheelchair cushion (measurements);To be determined  Skilled Therapeutic Intervention PT Performed Evaluation and initiated treatment  intervention; see below for results.  PT instructed patient in car transfer with mod A from PT for improved mobility with BLE and cues for improved UE positioning.    PT Evaluation Precautions/Restrictions Precautions Precautions: Back Precaution Booklet Issued: Yes (comment) Precaution Comments: Pt recalled 3 of 3 (no lifting vs on arching) Required Braces or Orthoses: Spinal Brace Spinal Brace: Lumbar corset;Applied in sitting position Restrictions Weight Bearing Restrictions: No General   Vital Signs Pain Pain Assessment Pain Assessment: 0-10 Pain Score: 4  Pain Type: Acute pain Pain Location: Back Pain Orientation: Lower;Posterior;Medial Pain Descriptors / Indicators: Burning;Aching Pain Onset: On-going Pain Intervention(s): Medication (See eMAR) Multiple Pain Sites: No Home Living/Prior Functioning Home Living Available Help at Discharge: Family (sister plans to reside w/pt temporarilty) Type of Home: House Home Access: Stairs to enter [employer] of Steps: 2 Entrance Stairs-Rails: None Home Layout: One level;Other (Comment) Bathroom Shower/Tub: Chiropodist: Handicapped height Bathroom Accessibility: Yes Additional Comments: Sister will be comeing to stay with patient on 6/2  Lives With: Alone Prior Function Level of Independence: Independent with basic ADLs  Able to Take Stairs?: Yes Driving: Yes Vocation: Full time employment (was on sick leave since January; on social security since January) 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  Leisure: Hobbies-yes (Comment) Comments: watching TV. active in church.  Vision/Perception     Cognition Overall  Cognitive Status: Within Functional Limits for tasks assessed Arousal/Alertness: Awake/alert Awareness: Appears intact Problem Solving: Appears intact Safety/Judgment: Appears intact Sensation Sensation Light Touch: Appears Intact Proprioception: Appears Intact Additional Comments: Patiet reports mild burning in Lateral and anterior proximal R LE.  Coordination Gross Motor Movements are Fluid and Coordinated: Yes Fine Motor Movements are Fluid and Coordinated: Yes Motor  Motor Motor: Within Functional Limits Motor - Skilled Clinical Observations: mild strength deficits secondary to pain .   Mobility Bed Mobility Bed Mobility: Rolling Right;Rolling Left;Supine to Sit;Sit to Supine Rolling Right: 4: Min assist Rolling Left: 4: Min assist Supine to Sit: 3: Mod assist Sit to Supine: 3: Mod assist Transfers Transfers: Yes Sit to Stand: 4: Min assist Stand Pivot Transfers: 4: Min assist Locomotion  Ambulation Ambulation: Yes Ambulation/Gait Assistance: 7: Independent;4: Min assist Ambulation Distance (Feet): 60 Feet Assistive device: Rolling walker Ambulation/Gait Assistance Details: Verbal cues for gait pattern;Verbal cues for safe use of DME/AE;Verbal cues for precautions/safety;Verbal cues for technique Gait Gait: Yes Gait Pattern: Impaired Gait Pattern: Decreased weight shift to right;Decreased weight shift to left;Wide base of support Stairs / Additional Locomotion Stairs: Yes Stairs Assistance: 4: Min assist Stair Management Technique: Two rails Number of Stairs: 4 Height of Stairs: 4 Wheelchair Mobility Wheelchair Mobility: Yes Wheelchair Assistance: 5: Investment banker, operational Details: Verbal cues for technique;Verbal cues for Information systems manager: Both upper extremities Wheelchair Parts Management: Needs assistance Distance: 175f  Trunk/Postural Assessment  Cervical Assessment Cervical Assessment: Within Functional  Limits Thoracic Assessment Thoracic Assessment: Exceptions to [facility](increased kyphosis) Lumbar Assessment Lumbar Assessment: Exceptions to WFL (decreased lodorsis and posterior pelvic titl) Postural Control Postural Control: Deficits on evaluation  Balance Balance Balance Assessed: Yes Static Sitting Balance Static Sitting - Level of Assistance: 6: Modified independent (Device/Increase time) Dynamic Sitting Balance Dynamic Sitting - Level of Assistance: 5: Stand by assistance Static Standing Balance Static Standing - Level of Assistance: 5: Stand by assistance (BUE support) Dynamic Standing Balance Dynamic Standing - Level of Assistance: 4: Min assist (BUE support ) Extremity Assessment  RUE Assessment RUE Assessment: Within Functional  Limits LUE Assessment LUE Assessment: Within Functional Limits       See Function Navigator for Current Functional Status.   Refer to Care Plan for Long Term Goals  Recommendations for other services: None  Discharge Criteria: Patient will be discharged from PT if patient refuses treatment 3 consecutive times without medical reason, if treatment goals not met, if there is a change in medical status, if patient makes no progress towards goals or if patient is discharged from hospital.  The above assessment, treatment plan, treatment alternatives and goals were discussed and mutually agreed upon: by patient  [name] 03/31/2016, 12:43 PM

## 2016-03-31 NOTE — Plan of Care (Signed)
Problem: RH SKIN INTEGRITY Goal: RH STG SKIN FREE OF INFECTION/BREAKDOWN Outcome: Progressing With min assisst  Problem: RH PAIN MANAGEMENT Goal: RH STG PAIN MANAGED AT OR BELOW PT'S PAIN GOAL Less than 3,on 1 to 10 scale

## 2016-03-31 NOTE — Progress Notes (Signed)
Physical Therapy Session Note  Patient Details  Name: Sheri Baker MRN: [id number] Date of Birth: [birth year]  Today's Date: 03/31/2016 PT Individual Time: 1304-1330 PT Individual Time Calculation (min): 26 min   Short Term Goals: Week 1:  PT Short Term Goal 1 (Week 1): STG=LTG due to ELOS  Skilled Therapeutic Interventions/Progress Updates:    Patient received with trade off from NT for gait with RW to BR for urination, 10 ft x 2 . Patient performed toilet transfer with Min A from PT and min cues for UE placement on had rails.  WC mobillity to and from gym for 153ft x 2 with supervision A from PT and min cues for improved door way management.  Gait training on uneven surface with mod A from PT with RW and mod-mox cues for AD management and improved step through gait pattern.   Patient left WC at end of session with call bell within Reach.   Therapy Documentation Precautions:  Precautions Precautions: Back Precaution Booklet Issued: Yes (comment) Precaution Comments: Pt recalled 3 of 3 (no lifting vs on arching) Required Braces or Orthoses: Spinal Brace Spinal Brace: Lumbar corset, Applied in sitting position Restrictions Weight Bearing Restrictions: No General:   Vital Signs:  Pain: Pain Assessment Pain Assessment: 0-10 Pain Score: 2  Pain Type: Surgical pain Pain Location: Back Pain Orientation: Lower Pain Descriptors / Indicators: Aching Pain Frequency: Intermittent Pain Onset: On-going Patients Stated Pain Goal: 2 Pain Intervention(s): Medication (See eMAR);Repositioned Multiple Pain Sites: No   See Function Navigator for Current Functional Status.   Therapy/Group: Individual Therapy  [name] 03/31/2016, 6:51 PM

## 2016-03-31 NOTE — Progress Notes (Signed)
[place] PHYSICAL MEDICINE & REHABILITATION     PROGRESS NOTE    Subjective/Complaints: Restless last night. Anxious about therapies today. Pain overall better though  Objective: Vital Signs: Blood pressure 115/68, pulse 109, temperature 98.4 F (36.9 C), temperature source Oral, resp. rate 18, height 5\' 5"  (1.651 m), weight 138.347 kg (305 lb), SpO2 98 %. No results found.  Recent Labs  03/31/16 0421  WBC 8.6  HGB 9.4*  HCT 29.8*  PLT 228    Recent Labs  03/31/16 0421  NA 140  K 3.5  CL 108  GLUCOSE 111*  BUN 10  CREATININE 0.75  CALCIUM 8.3*   CBG (last 3)   Recent Labs  03/30/16 1646 03/30/16 2030 03/31/16 0641  GLUCAP 113* 109* 96    Wt Readings from Last 3 Encounters:  03/30/16 138.347 kg (305 lb)  03/27/16 138.71 kg (305 lb 12.8 oz)  03/20/16 129 kg (284 lb 6.3 oz)    Physical Exam:  Constitutional: She is oriented to person, place, and time. She appears well-developed and well-nourished. No distress.  Morbidly obese  HENT:  Head: Normocephalic and atraumatic.  Mouth/Throat: Oropharynx is clear and moist.  Eyes: Conjunctivae are normal. Pupils are equal, round, and reactive to light. Right eye exhibits no discharge. Left eye exhibits no discharge.  Neck: Normal range of motion. Neck supple.  Cardiovascular: Normal rate and regular rhythm.  No murmur heard. Respiratory: Effort normal. No stridor. She has no wheezes.  GI: Soft. Bowel sounds are normal. There is no tenderness.  Musculoskeletal: She exhibits edema.  1+ pretibial edema  Neurological: She is alert and oriented to person, place, and time.  UE strength 5/5. LE: 3/5 HF, 4/5 KE and ADF/PF. ?decreased LT over buttocks bilaterally. Otherwise sensation intact.  Skin: Skin is warm and dry. She is not diaphoretic.  Lower back incision intact  Psychiatric: Her speech is normal. Judgment and thought content normal. Less anxious. Cognition and memory are normal.   Assessment/Plan: 1.  Functional and mobility deficits secondary to lumbar spondylolisthesis/radiculopathy which require 3+ hours per day of interdisciplinary therapy in a comprehensive inpatient rehab setting. Physiatrist is providing close team supervision and 24 hour management of active medical problems listed below. Physiatrist and rehab team continue to assess barriers to discharge/monitor patient progress toward functional and medical goals.  Function:  Bathing Bathing position      Bathing parts      Bathing assist        Upper Body Dressing/Undressing Upper body dressing                    Upper body assist        Lower Body Dressing/Undressing Lower body dressing                                  Lower body assist        Toileting Toileting   Toileting steps completed by patient: Adjust clothing prior to toileting, Performs perineal hygiene, Adjust clothing after toileting   Toileting Assistive Devices: Grab bar or rail  Toileting assist Assist level: More than reasonable time, Supervision or verbal cues   Transfers Chair/bed transfer             Locomotion Ambulation           Wheelchair          Cognition Comprehension    Expression  Social Interaction    Problem Solving    Memory     Medical Problem List and Plan: 1. Functional and mobility deficits secondary to lumbar spondlylolisthesis and radiculopathy  -begin therapies today 2. DVT Prophylaxis/Anticoagulation: Mechanical: Sequential compression devices, below knee Bilateral lower extremities  3. Pain Management: Change hydrocodone (causes palpitations) to oxycodone prn. Will schedule tramadol for better pain control.  -robaxin prn for spasms 4. Mood: Team to provide ego support. LCSW to follow for evaluation and support.  5. Neuropsych: This patient is capable of making decisions on her own behalf. 6. Skin/Wound Care: Monitor wound daily for healing. Maintain adequate  nutritional and hydration status.  7. Fluids/Electrolytes/Nutrition: I personally reviewed the patient's labs today and normal  8. T2DM--diet controlled: tight control at present  9. HTN: Monitor BP bid and set parameters as running low. On aldactone/HCTZ--continue for now.  Cozaar--continue to hold for now. Encourage fluid intake.  10 ABLA: hgb only 9.4.  Added iron supplement.  11. Leucocytosis: Monitor for fevers or other signs of infection.    LOS (Days) 1 A FACE TO FACE EVALUATION WAS PERFORMED  SWARTZ,ZACHARY T 03/31/2016 8:44 AM

## 2016-03-31 NOTE — Progress Notes (Signed)
Pt. Was admitted yesterday to rehab,pt. Has been oriented to the unit routine and protocol.Safety plan was explained,fall prevention plan was explained and sign by pt. And RN.Welcome video was presented.

## 2016-04-01 DIAGNOSIS — R079 Chest pain, unspecified: Secondary | ICD-10-CM

## 2016-04-01 LAB — GLUCOSE, CAPILLARY
Glucose-Capillary: 102 mg/dL — ABNORMAL HIGH (ref 65–99)
Glucose-Capillary: 101 mg/dL — ABNORMAL HIGH (ref 65–99)
Glucose-Capillary: 99 mg/dL (ref 65–99)
Glucose-Capillary: 104 mg/dL — ABNORMAL HIGH (ref 65–99)

## 2016-04-01 MED ORDER — METHOCARBAMOL 750 MG PO TABS
750.0000 mg | ORAL_TABLET | Freq: Four times a day (QID) | ORAL | Status: DC
  Administered 2016-04-01 – 2016-04-18 (×67): 750 mg via ORAL
  Filled 2016-04-01: qty 1
  Filled 2016-04-01: qty 2
  Filled 2016-04-01: qty 1
  Filled 2016-04-01: qty 2
  Filled 2016-04-01: qty 1
  Filled 2016-04-01 (×2): qty 2
  Filled 2016-04-01 (×5): qty 1
  Filled 2016-04-01: qty 2
  Filled 2016-04-01 (×6): qty 1
  Filled 2016-04-01: qty 2
  Filled 2016-04-01 (×5): qty 1
  Filled 2016-04-01 (×2): qty 2
  Filled 2016-04-01 (×2): qty 1
  Filled 2016-04-01: qty 2
  Filled 2016-04-01 (×2): qty 1
  Filled 2016-04-01 (×2): qty 2
  Filled 2016-04-01 (×2): qty 1
  Filled 2016-04-01: qty 2
  Filled 2016-04-01: qty 1
  Filled 2016-04-01 (×2): qty 2
  Filled 2016-04-01 (×2): qty 1
  Filled 2016-04-01: qty 2
  Filled 2016-04-01: qty 1
  Filled 2016-04-01: qty 2
  Filled 2016-04-01 (×4): qty 1
  Filled 2016-04-01: qty 2
  Filled 2016-04-01 (×12): qty 1
  Filled 2016-04-01: qty 2
  Filled 2016-04-01 (×5): qty 1

## 2016-04-01 NOTE — Progress Notes (Signed)
At 0530, complained of "sharp, left chest pain." Pointing above left breast. Tender to palpitation.  Denies feeling SOB. Vitals-resp-22, O2 sat 96% RA, 121/70, HR 107, temp. 99.9. Pain to chest resolved, within couple of minutes. Paged Dr. Naaman Plummer, order for ice pack. Sheri Baker A

## 2016-04-01 NOTE — Progress Notes (Signed)
Resting quietly after previous complaint of CP. Ice pack in place to left chest. Sheri Baker A

## 2016-04-01 NOTE — Progress Notes (Signed)
[place] PHYSICAL MEDICINE & REHABILITATION     PROGRESS NOTE    Subjective/Complaints: Therapy was "rough" yesterday but she thought she did well. Woke up with left chest discomfort. No SOB, Cough. Anxiety still an issue  ROS: Pt denies fever, rash/itching, headache, blurred or double vision, nausea, vomiting, abdominal pain, diarrhea, , shortness of breath, palpitations, dysuria, dizziness, neck or back pain, bleeding,   or depression   Objective: Vital Signs: Blood pressure 121/70, pulse 107, temperature 99.9 F (37.7 C), temperature source Oral, resp. rate 22, height 5\' 5"  (1.651 m), weight 138.347 kg (305 lb), SpO2 96 %. No results found.  Recent Labs  03/31/16 0421  WBC 8.6  HGB 9.4*  HCT 29.8*  PLT 228    Recent Labs  03/31/16 0421  NA 140  K 3.5  CL 108  GLUCOSE 111*  BUN 10  CREATININE 0.75  CALCIUM 8.3*   CBG (last 3)   Recent Labs  03/31/16 1630 03/31/16 2055 04/01/16 0650  GLUCAP 83 121* 102*    Wt Readings from Last 3 Encounters:  03/30/16 138.347 kg (305 lb)  03/27/16 138.71 kg (305 lb 12.8 oz)  03/20/16 129 kg (284 lb 6.3 oz)    Physical Exam:  Constitutional: She is oriented to person, place, and time. She appears well-developed and well-nourished. No distress.  Morbidly obese  HENT:  Head: Normocephalic and atraumatic.  Mouth/Throat: Oropharynx is clear and moist.  Eyes: Conjunctivae are normal. Pupils are equal, round, and reactive to light. Right eye exhibits no discharge. Left eye exhibits no discharge.  Neck: Normal range of motion. Neck supple.  Cardiovascular: Normal rate and regular rhythm.  No murmur heard. Respiratory: Effort normal. No stridor. She has no wheezes.  GI: Soft. Bowel sounds are normal. There is no tenderness.  Musculoskeletal: She exhibits edema.  Trace to 1+ pretibial edema  Has pain with palpation over left rib cage/pec--pain with resisted shoulder adduction Neurological: She is alert and oriented to  person, place, and time.  UE strength 5/5. LE: 3/5 HF, 4/5 KE and ADF/PF.  sensation intact.  Skin: Skin is warm and dry. She is not diaphoretic.  Lower back incision intact  Psychiatric: Her speech is normal. Judgment and thought content normal. Less anxious. Cognition and memory are normal.   Assessment/Plan: 1. Functional and mobility deficits secondary to lumbar spondylolisthesis/radiculopathy which require 3+ hours per day of interdisciplinary therapy in a comprehensive inpatient rehab setting. Physiatrist is providing close team supervision and 24 hour management of active medical problems listed below. Physiatrist and rehab team continue to assess barriers to discharge/monitor patient progress toward functional and medical goals.  Function:  Bathing Bathing position   Position: Shower  Bathing parts Body parts bathed by patient: Right arm, Left arm, Chest, Abdomen, Front perineal area, Right upper leg, Left upper leg Body parts bathed by helper: Buttocks, Back  Bathing assist Assist Level: Touching or steadying assistance(Pt > 75%) (Moderate assist)      Upper Body Dressing/Undressing Upper body dressing   What is the patient wearing?: Hospital gown                Upper body assist Assist Level: Set up   Set up : To obtain clothing/put away, To apply TLSO, cervical collar  Lower Body Dressing/Undressing Lower body dressing   What is the patient wearing?: Hospital Gown, [place], Non-skid slipper socks           Non-skid slipper socks- Performed by helper: Don/doff right  sock, Don/doff left sock               TED Hose - Performed by helper: Don/doff right TED hose, Don/doff left TED hose  Lower body assist Assist for lower body dressing:  (Total assist)      Toileting Toileting   Toileting steps completed by patient: Adjust clothing prior to toileting, Performs perineal hygiene, Adjust clothing after toileting   Toileting Assistive Devices: Grab bar or  rail  Toileting assist Assist level: Supervision or verbal cues, More than reasonable time   Transfers Chair/bed transfer   Chair/bed transfer method: Stand pivot Chair/bed transfer assist level: Touching or steadying assistance (Pt > 75%) Chair/bed transfer assistive device: Armrests, Medical sales representative     Max distance: 60 Assist level: Touching or steadying assistance (Pt > 75%)   Wheelchair   Type: Manual Max wheelchair distance: 112ft Assist Level: Supervision or verbal cues  Cognition Comprehension Comprehension assist level: Follows complex conversation/direction with extra time/assistive device  Expression Expression assist level: Expresses complex ideas: With extra time/assistive device  Social Interaction Social Interaction assist level: Interacts appropriately with others with medication or extra time (anti-anxiety, antidepressant).  Problem Solving Problem solving assist level: Solves basic problems with no assist  Memory Memory assist level: Recognizes or recalls 90% of the time/requires cueing < 10% of the time   Medical Problem List and Plan: 1. Functional and mobility deficits secondary to lumbar spondlylolisthesis and radiculopathy  -begin therapies today 2. DVT Prophylaxis/Anticoagulation: Mechanical: Sequential compression devices, below knee Bilateral lower extremities  3. Pain Management: Change hydrocodone (causes palpitations) to oxycodone prn. Will schedule tramadol for better pain control.  -robaxin helpful for pain---will change to 750mg  qid scheduled  -utilize ice/heat also  -chest pain musculoskeletal ---no signs of cardiac source---reassured patient--anxiety also a factor 4. Mood: Team to provide ego support. LCSW to follow for evaluation and support.  5. Neuropsych: This patient is capable of making decisions on her own behalf. 6. Skin/Wound Care: Monitor wound daily for healing. Maintain adequate nutritional and  hydration status.  7. Fluids/Electrolytes/Nutrition: I personally reviewed the patient's labs today and normal  8. T2DM--diet controlled: tight control at present  9. HTN: Monitor BP bid and set parameters as running low. On aldactone/HCTZ--continue for now.  Cozaar--continue to hold for now. Encourage fluid intake.  10 ABLA: hgb only 9.4.  Added iron supplement.  11. Leucocytosis: Monitor for fevers or other signs of infection.    LOS (Days) 2 A FACE TO FACE EVALUATION WAS PERFORMED  SWARTZ,ZACHARY T 04/01/2016 8:21 AM

## 2016-04-02 ENCOUNTER — Inpatient Hospital Stay (HOSPITAL_COMMUNITY): Payer: BLUE CROSS/BLUE SHIELD | Admitting: Physical Therapy

## 2016-04-02 ENCOUNTER — Inpatient Hospital Stay (HOSPITAL_COMMUNITY): Payer: BLUE CROSS/BLUE SHIELD | Admitting: Occupational Therapy

## 2016-04-02 DIAGNOSIS — E119 Type 2 diabetes mellitus without complications: Secondary | ICD-10-CM | POA: Diagnosis present

## 2016-04-02 DIAGNOSIS — E1169 Type 2 diabetes mellitus with other specified complication: Secondary | ICD-10-CM | POA: Diagnosis present

## 2016-04-02 DIAGNOSIS — K5901 Slow transit constipation: Secondary | ICD-10-CM

## 2016-04-02 DIAGNOSIS — D62 Acute posthemorrhagic anemia: Secondary | ICD-10-CM | POA: Diagnosis present

## 2016-04-02 DIAGNOSIS — I10 Essential (primary) hypertension: Secondary | ICD-10-CM

## 2016-04-02 DIAGNOSIS — E669 Obesity, unspecified: Secondary | ICD-10-CM

## 2016-04-02 LAB — GLUCOSE, CAPILLARY
Glucose-Capillary: 107 mg/dL — ABNORMAL HIGH (ref 65–99)
Glucose-Capillary: 104 mg/dL — ABNORMAL HIGH (ref 65–99)
Glucose-Capillary: 98 mg/dL (ref 65–99)
Glucose-Capillary: 121 mg/dL — ABNORMAL HIGH (ref 65–99)

## 2016-04-02 LAB — URINE CULTURE: Culture: 30000 — AB

## 2016-04-02 LAB — HEMOGLOBIN A1C
Hgb A1c MFr Bld: 6 % — ABNORMAL HIGH (ref 4.8–5.6)
Mean Plasma Glucose: 126 mg/dL

## 2016-04-02 MED ORDER — SENNOSIDES-DOCUSATE SODIUM 8.6-50 MG PO TABS
2.0000 | ORAL_TABLET | Freq: Two times a day (BID) | ORAL | Status: DC
  Administered 2016-04-02 – 2016-04-18 (×27): 2 via ORAL
  Filled 2016-04-02 (×31): qty 2

## 2016-04-02 MED ORDER — POLYETHYLENE GLYCOL 3350 17 G PO PACK: 17.0000 g | PACK | Freq: Once | ORAL | Status: DC

## 2016-04-02 NOTE — Care Management Note (Signed)
[facility] Individual Statement of Services  Patient Name:  Sheri Baker  Date:  04/02/2016  Welcome to the [facility].  Our goal is to provide you with an individualized program based on your diagnosis and situation, designed to meet your specific needs.  With this comprehensive rehabilitation program, you will be expected to participate in at least 3 hours of rehabilitation therapies Monday-Friday, with modified therapy programming on the weekends.  Your rehabilitation program will include the following services:  Physical Therapy (PT), Occupational Therapy (OT), 24 hour per day rehabilitation nursing, Therapeutic Recreaction (TR), Case Management (Social Worker), Rehabilitation Medicine, Nutrition Services and Pharmacy Services  Weekly team conferences will be held on Wednesday to discuss your progress.  Your Social Worker will talk with you frequently to get your input and to update you on team discussions.  Team conferences with you and your family in attendance may also be held.  Expected length of stay: 8-12 days  Overall anticipated outcome: mod/i level  Depending on your progress and recovery, your program may change. Your Social Worker will coordinate services and will keep you informed of any changes. Your Social Worker's name and contact numbers are listed  below.  The following services may also be recommended but are not provided by the [facility] will be made to provide these services after discharge if needed.  Arrangements include referral to agencies that provide these services.  Your insurance has been verified to be:  [address] Your primary doctor is:    Pertinent information will be shared with your doctor and your insurance company.  Social Worker:  Ovidio Kin, [place]  or ([phone]  Information discussed with and copy given to patient by: [name], 04/02/2016, 10:21 AM

## 2016-04-02 NOTE — Progress Notes (Signed)
Social Work  Social Work Assessment and Plan  Patient Details  Name: Sheri Baker MRN: [id number] Date of Birth: 02-11-1951  Today's Date: 04/02/2016  Problem List:  Patient Active Problem List   Diagnosis Date Noted  . Acute blood loss anemia   . Benign essential HTN   . Slow transit constipation   . Diabetes mellitus type 2 in obese ([place])   . Spinal stenosis of lumbar region with radiculopathy 03/30/2016  . Spondylolisthesis of lumbar region 03/27/2016   Past Medical History:  Past Medical History  Diagnosis Date  . Hypertension   . Dysrhythmia     "palpations with full dose of hydrocodone"- takes 1/2 of Hydrocodone- Acetaminophen and does not have palpations  . 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 Hypothyroidism   . GERD (gastroesophageal reflux disease)   . Constipation   . History of hiatal hernia   . Hemorrhoids   . Anemia   . Arthritis   . Headache     occasional   Past Surgical History:  Past Surgical History  Procedure Laterality Date  . Foot surgery Left     Callus- reaset bone  . Abdominal hysterectomy      partial -right ovary remains  . Tonsillectomy    . Cervix removal    . Colonoscopy     Social History:  reports that she has quit smoking. She does not have any smokeless tobacco history on file. She reports that she drinks about 1.8 oz of alcohol per week. She reports that she does not use illicit drugs.  Family / Support Systems Marital Status: Divorced Patient Roles: Parent, Other (Comment) (Sibling) Children: Berlinda Last [phone] [place] Other Supports: Luther-brother [phone]    Barnett Applebaum  [phone] Anticipated Caregiver: Sister plans to come and stay for a week once school gets out 6/24 Ability/Limitations of Caregiver: Sister will not be available until 6/24 Caregiver Availability: Other (Comment) (Sister one week then intermittent assist) Family Dynamics: Close  knit with brother who lives in [place] and sister in [place], along with her son who is in [place]. She has friends and church members who are willing to come by and check on her, she needs to be mod/i  before going home from rehab.  Social History Preferred language: English Religion: Baptist Cultural Background: No issues Education: [employer] Read: Yes Write: Yes Employment Status: Unemployed Date Retired/Disabled/Unemployed: Out of work since Jan 2017 applied for [facility] 01/2016 Legal Hisotry/Current Legal Issues: No issues Guardian/Conservator: None-according to MD pt is capable of making her own decisions while here.   Abuse/Neglect Physical Abuse: Denies Verbal Abuse: Denies Sexual Abuse: Denies Exploitation of patient/patient's resources: Denies Self-Neglect: Denies  Emotional Status Pt's affect, behavior adn adjustment status: Pt is motivated to do her part and glad the surgery is over and the recovery can begin. She wants to be able to take care of herself by the time she leaves here. She always has and feels she will get there again. Recent Psychosocial Issues: back pain main helath issue and has caused her pain for awhile now. Pyschiatric History: History of anxiety takes medications for this and finds them helpful. May benefit from neruo-psych seeing while here. Will discuss with therapy team. Substance Abuse History: No issues  Patient / Family Perceptions, Expectations & Goals Pt/Family understanding of illness & functional limitations: Pt is able to exaplin her back surgery and her precautions. She is ready to work and feels her MD has  answered her questions and addressed her concerns.  Premorbid pt/family roles/activities: Mother, Sister, Church member, home owner, friend, etc Anticipated changes in roles/activities/participation: resume Pt/family expectations/goals: Pt state: " I want to be able to take care of myself before I leave here."  Sister states: " I will help  her get settled when discharged."  [employer]: None Premorbid Home Care/DME Agencies: None Transportation available at discharge: Brother and friends Resource referrals recommended: Neuropsychology  Discharge Planning Living Arrangements: Alone Support Systems: Children, Other relatives, Water engineer, Social worker community Type of Residence: Private residence Insurance underwriter Resources: [employer], Multimedia programmer (specify) Clinical cytogeneticist) Financial Resources: Other (Comment) (Attempting to get Disability) Financial Screen Referred: Yes Living Expenses: Own Money Management: Patient Does the patient have any problems obtaining your medications?: No Home Management: Self has been difficult the last few months Patient/Family Preliminary Plans: Return home with sister coming for a week to assist her with adjusting at home and transitioning. She will need to be mod/i level and is looking into getting a rail for her two steps.  Social Work Anticipated Follow Up Needs: HH/OP, Support Group  Clinical Impression Pleasant female who is motivated to recover from her back surgery and regain her independence. She has supportive family and her sister plans to come and stay a week with her for the transition home. Pt should do well here and reach her goals. She has been managing at home and it has been a struggle at times, due to the pain. She has applied for SSD and is awaiting the process. Will provide support and work on discharge Plans.  [name] 04/02/2016, 10:35 AM

## 2016-04-02 NOTE — Progress Notes (Signed)
Occupational Therapy Session Note  Patient Details  Name: Sheri Baker MRN: [id number] Date of Birth: 08-01-1951  Today's Date: 04/02/2016 OT Individual Time: [id number] OT Individual Time Calculation (min): 72 min    Skilled Therapeutic Interventions/Progress Updates:    Pt transferred from bedside chair to wheelchair with mod assist stand pivot with the RW.  Pt reports pain 9/10 in the lower back with transitional movements or standing, but decreases with repositioning in sitting.  Had pt complete tub shower transfers from wheelchair to heavy duty tub bench.  Pt needing min assist with lifting LEs in over the edge if the tub.  She was able to move them back out with supervision but needed mod assist for sit to stand.  Noted LLE buckling at times in standing, needing use of the RW for support with the UEs.  She was able to then practice transfer from the wheelchair to bed with mod assist.  She needed min assist for transfer from sitting to supine, with lifting LEs up in the bed.  Increased pain once in supine, so transitioned to her side, which she reports usually sleeping on at home.  Finished session by returning to room and transferring stand pivot to the toilet.  Pt needed mod assist for clothing management with total assist for hygiene.  Will need education on AE for toilet hygiene as she cannot reach her peri area in the back.  Transferred back to bedside recliner with call button and phone in reach.    Therapy Documentation Precautions:  Precautions Precautions: Back Precaution Booklet Issued: Yes (comment) Precaution Comments: Pt recalled 3 of 3 (no lifting vs on arching) Required Braces or Orthoses: Spinal Brace Spinal Brace: Lumbar corset, Applied in sitting position Restrictions Weight Bearing Restrictions: No  Pain: Pain Assessment Pain Assessment: No/denies pain Pain Score: 2  ADL: ADL ADL Comments: see Assessment Tool  See Function Navigator for Current Functional  Status.   Therapy/Group: Individual Therapy  MCGUIRE,JAMES OTR/L 04/02/2016, 3:41 PM

## 2016-04-02 NOTE — Discharge Summary (Signed)
Physician Discharge Summary  Patient ID: Sheri Baker MRN: [id number] DOB/AGE: 66/06/1951 66 y.o.  Admit date: 03/30/2016 Discharge date: 04/18/2016  Discharge Diagnoses:  Principal Problem:   Spinal stenosis of lumbar region with radiculopathy Active Problems:   Spondylolisthesis of lumbar region   Acute blood loss anemia   Benign essential HTN   Slow transit constipation   Diabetes mellitus type 2 in obese (HCC)   Neuropathic pain of both legs   Leukocytosis   Hyponatremia   Pain in rib   Pulmonary embolism without acute cor pulmonale (HCC)   Anxiety state   Thrombocytopenia ([place])   Discharged Condition: stable   Significant Diagnostic Studies: Dg Chest 2 View  04/09/2016  CLINICAL DATA:  Short of breath since last night EXAM: CHEST  2 VIEW COMPARISON:  None. FINDINGS: Normal heart size. Lungs are under aerated. Vascular congestion without pulmonary edema. No pneumothorax or pleural effusion. IMPRESSION: Vascular congestion without pulmonary edema. Electronically Signed   By: Marybelle Killings M.D.   On: 04/09/2016 07:27   Dg Lumbar Spine 2-3 Views  03/27/2016  CLINICAL DATA:  PLIF of L4- L5, L5-S1. EXAM: LUMBAR SPINE - 2-3 VIEW; DG C-ARM 61-120 MIN fluoroscopic time 1 minutes and 5 seconds. COMPARISON:  None. FINDINGS: Two fluoroscopic images demonstrate patient status posterior fusion of L4 through S1 with no malalignment. IMPRESSION: Posterior fusion of L4 through S1 with no malalignment. Electronically Signed   By: Abelardo Diesel M.D.   On: 03/27/2016 13:51   Dg Lumbar Spine Complete  04/16/2016  CLINICAL DATA:  Pain.  Prior back surgery 03/27/2016. EXAM: LUMBAR SPINE - COMPLETE 4+ VIEW COMPARISON:  03/27/2016. FINDINGS: Prior L4 through S1 posterior interbody fusion. Hardware appears to be stable. Good anatomic alignment noted. Prior lower lumbar laminectomy. Diffuse degenerative change. No acute bony abnormality . IMPRESSION: Stable postsurgical changes of posterior L4 through S1  and interbody fusion. No acute abnormality. Electronically Signed   By: Marcello Moores  Register   On: 04/16/2016 13:25   Ct Angio Chest Pe W Or Wo Contrast  04/09/2016  CLINICAL DATA:  Chest pain and short of breath since this morning. Presently, it is resolved. EXAM: CT ANGIOGRAPHY CHEST WITH CONTRAST TECHNIQUE: Multidetector CT imaging of the chest was performed using the standard protocol during bolus administration of intravenous contrast. Multiplanar CT image reconstructions and MIPs were obtained to evaluate the vascular anatomy. CONTRAST:  100 cc Isovue 370 COMPARISON:  None. FINDINGS: There is filling defect in the main peripheral right pulmonary artery extending into right upper, middle, and lower lobe lobar as well as segmental branches. There is segmental pulmonary thromboembolism within branches in the lingula. See image 117 of series 407. RV to LV ratio is 1.0. This is borderline for right heart strain. There is no evidence of aortic dissection or aneurysm. Great vessels are grossly patent within the confines of the examination. No abnormal mediastinal adenopathy. No pneumothorax.  No pleural effusion Dependent atelectasis bilaterally. No vertebral compression deformity. There is no evidence of reflux of contrast into the hepatic veins. Review of the MIP images confirms the above findings. IMPRESSION: The study is positive for bilateral acute pulmonary thromboembolism. There is borderline right heart strain as described above. Correlation with echocardiogram, electrocardiogram and troponin levels can be performed to stratify severity. Critical Value/emergent results were called by telephone at the time of interpretation on 04/09/2016 at 11:26 am to Dr. Susann Givens, RN, who verbally acknowledged these results. Electronically Signed   By: Marybelle Killings M.D.   On:  04/09/2016 11:27   Dg Lumbar Spine 1 View  03/27/2016  CLINICAL DATA:  Posterior fusion L4-L5 S1 EXAM: LUMBAR SPINE - 1 VIEW COMPARISON:  01/30/2016  FINDINGS: Single lateral views of the lumbar spine submitted. There is a posterior localization instrument at the level of L4 spinous process. Second posterior localization instrument at the level of lower endplate of L5 vertebral body/inferior aspect of spinous process of L5. There is disc space flattening with vacuum disc phenomenon at L5-S1 level. She IMPRESSION: There is a posterior localization instrument at the level of L4 spinous process. Second posterior localization instrument at the level of lower endplate of L5 vertebral body/inferior aspect of spinous process of L5. Electronically Signed   By: Lahoma Crocker M.D.   On: 03/27/2016 11:18   Dg C-arm 1-60 Min  03/27/2016  CLINICAL DATA:  PLIF of L4- L5, L5-S1. EXAM: LUMBAR SPINE - 2-3 VIEW; DG C-ARM 61-120 MIN fluoroscopic time 1 minutes and 5 seconds. COMPARISON:  None. FINDINGS: Two fluoroscopic images demonstrate patient status posterior fusion of L4 through S1 with no malalignment. IMPRESSION: Posterior fusion of L4 through S1 with no malalignment. Electronically Signed   By: Abelardo Diesel M.D.   On: 03/27/2016 13:51    Labs:  Basic Metabolic Panel:  Recent Labs Lab 04/13/16 0631  NA 136  K 3.5  CL 98*  CO2 29  GLUCOSE 109*  BUN 12  CREATININE 0.75  CALCIUM 8.9    CBC:  Recent Labs Lab 04/16/16 1531 04/17/16 0630 04/18/16 0656  WBC 7.5 6.6 5.6  NEUTROABS 4.1 3.5 2.8  HGB 11.0* 10.3* 10.0*  HCT 35.4* 33.6* 32.4*  MCV 84.5 83.8 83.3  PLT 180 201 195    CBG:  Recent Labs Lab 04/17/16 0646 04/17/16 1113 04/17/16 1625 04/17/16 2053 04/18/16 0647  GLUCAP 104* 91 111* 104* 105*     Brief HPI:   Sheri Baker is a 65 y.o. 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 Patient elected to undergo decompressive laminectomy L4-L5, L5/S1 with decompression of L4, L5 and S1 nerve roots on 03/27/16 by  Dr. Ellene Route. She continued to have issues with pain control and was started on Toredol to help manage symptoms. Post op with ABLA and leucocytosis with WBC up to 13.6. Therapy ongoing and patient limited by pain as well as back precautions. CIR recommended for follow up therapy.    Hospital Course: Sheri Baker was admitted to rehab 03/30/2016 for inpatient therapies to consist of PT and OT at least three hours five days a week. Past admission physiatrist, therapy team and rehab RN have worked together to provide customized collaborative inpatient rehab. Blood pressures were monitored on bid basis and Cozaar was held due to low readings. She was advised to increase fluid intake to help with dizziness. Labs past admission showed mild hyponatremia which has resolved with improvement in po intake.   She did develop chest pain on 6/19 and was found to have bilateral acute PE. She was started on Xarelto for treatment and follow up CBC showed thrombocytopenia with drop in platelets to 65 as well as reactive leucocytosis.  Hem/Onc was consulted and recommended monitoring for now as well as d/c of protonix which could be contributing to this.  This was monitored with serial CBC and shows that platelets have recovered to 180 and leucocytosis has resolved.   She has had high levels  of anxiety requiring ego support. She was started on lexapro for mood stabilization and low dose Klonopin was scheduled at bedtime to help with anxiety contributing to pain. Respiratory status and endurance have improved and she has been weaned off oxygen. Iron supplement was added to help manage post op ABLA. This  has been relatively  stable and ranged from 9.4 to 10 overall.  Her back incision is healing well without signs or symptoms of infection. Neurontin has been steadily increased to 1200 mg tid to help manage dysesthesias.  She continued to have reports of back pain requiring titration of multiple medications. Tizanidine was added to  robaxin to help manage spasms. Tramadol was scheduled on qid basis with low dose oxycontin was added to help with pain management and activity tolerance. She has made steady progress during her rehab stay and is modified independent at discharge. Pain control has greatly improved with increase in activity tolerance and she has been educated on taper of narcotics after discharge. She will continue to receive follow up HHPT by [facility] after discharge.    Rehab course: During patient's stay in rehab weekly team conferences were held to monitor patient's progress, set goals and discuss barriers to discharge. At admission patient required moderate assist with basic self care tasks and min assist with mobility. She has had improvement in activity tolerance, balance, postural control, as well as ability to compensate for deficits. She is able to bathe and dress LB  at modified independent level and requires supervision with UB dressing. She has bee educated on use of AE for IADLs tasks. She is modified independent for transfers, climbing 12 stairs with one rail and is ambulating 250 feet with RW at modified independent level. Family education was completed prior to discharge and sister to assist as needed for 1-2 weeks.   Disposition: Home.   Diet: Diabetic diet.   Special Instructions: 1. Continue back precautions. No driving till cleared by MD. 2. Check blood sugars 2-3 times a days before meals.  3. Needs repeat CBC in a week.       Discharge Instructions    Ambulatory referral to Physical Medicine Rehab    Complete by:  As directed   1-2 week follow up/spinal stenosis/radiculopathy            Medication List    STOP taking these medications        HYDROcodone-acetaminophen 5-325 MG tablet  Commonly known as:  NORCO/VICODIN     losartan 100 MG tablet  Commonly known as:  COZAAR     meloxicam 15 MG tablet  Commonly known as:  MOBIC     omeprazole 20 MG tablet   Commonly known as:  PRILOSEC OTC     potassium gluconate 595 (99 K) MG Tabs tablet     UNABLE TO FIND     UNABLE TO FIND      TAKE these medications        baclofen 10 MG tablet  Commonly known as:  LIORESAL  Take 10 mg by mouth daily as needed for muscle spasms.     clonazePAM 0.5 MG tablet  Commonly known as:  KLONOPIN  Take 1 tablet (0.5 mg total) by mouth at bedtime.     CRESTOR 20 MG tablet  Generic drug:  rosuvastatin  Take 20 mg by mouth daily.     escitalopram 5 MG tablet  Commonly known as:  LEXAPRO  Take 1 tablet (5 mg total) by mouth daily.  gabapentin 300 MG capsule  Commonly known as:  NEURONTIN  Take 300 mg by mouth 3 (three) times daily.     gabapentin 400 MG capsule  Commonly known as:  NEURONTIN  Take 3 capsules (1,200 mg total) by mouth 3 (three) times daily. For neuropathy     iron polysaccharides 150 MG capsule  Commonly known as:  NIFEREX  Take 1 capsule (150 mg total) by mouth 2 (two) times daily.     lidocaine 5 %  Commonly known as:  LIDODERM  Apply to left hip at 7 am and remove at 7 pm daily     methocarbamol 750 MG tablet  Commonly known as:  ROBAXIN  Take 1 tablet (750 mg total) by mouth 4 (four) times daily.     NATURE-THROID 16.25 MG Tabs  Generic drug:  Thyroid  Take 16.25 mg by mouth daily.     OVER THE COUNTER MEDICATION  Take 15 mLs by mouth daily. Beet powder supplement     oxyCODONE 10 mg 12 hr tablet--Rx 21 pills  Commonly known as:  OXYCONTIN  Take one pill at 8 am and 8 pm for a week. Then decrease to one daily till gone.     oxyCODONE 5 MG immediate release tablet--Rx 90 pills   Commonly known as:  Oxy IR/ROXICODONE  Take 1 tablet (5 mg total) by mouth every 6 (six) hours as needed for severe pain.     polyethylene glycol packet  Commonly known as:  MIRALAX / GLYCOLAX  Take 17 g by mouth daily as needed.     Rivaroxaban 15 MG Tabs tablet  Commonly known as:  XARELTO  Take 1 tablet (15 mg total) by mouth 2  (two) times daily with a meal. Take till gone. Then decrease to 20 mg daily starting on 7/10     rivaroxaban 20 MG Tabs tablet  Commonly known as:  XARELTO  Take 1 tablet (20 mg total) by mouth daily with supper. Starting on 04/30/16  Start taking on:  04/30/2016     senna-docusate 8.6-50 MG tablet  Commonly known as:  Senokot-S  Take 2 tablets by mouth 2 (two) times daily.     spironolactone-hydrochlorothiazide 25-25 MG tablet  Commonly known as:  ALDACTAZIDE  Take 1 tablet by mouth every morning.     tiZANidine 2 MG tablet  Commonly known as:  ZANAFLEX  Take 1 tablet (2 mg total) by mouth 3 (three) times daily.     traMADol 50 MG tablet--Rx # 100  Commonly known as:  ULTRAM  Take 1 tablet (50 mg total) by mouth 3 (three) times daily.     Vitamin D-3 1000 units Caps  Take 4,000 Units by mouth daily.       Follow-up Information    Follow up with Earleen Newport, MD.   Specialty:  Neurosurgery   Why:  Call for appointment 2 weeks   Contact information:   [address] [place] [address] [place] [address] [phone]       Follow up with Delice Lesch MD.   Contact information:   [address] [place], [place] [address] [phone].      SignedBary Leriche 04/18/2016, 10:49 AM

## 2016-04-02 NOTE — Progress Notes (Signed)
Patient information reviewed and entered into eRehab system by Marie Noel, RN, CRRN, PPS Coordinator.  Information including medical coding and functional independence measure will be reviewed and updated through discharge.     Per nursing patient was given "Data Collection Information Summary for Patients in Inpatient Rehabilitation Facilities with attached "Privacy Act Statement-Health Care Records" upon admission.  

## 2016-04-02 NOTE — Progress Notes (Signed)
Physical Therapy Session Note  Patient Details  Name: Sheri Baker MRN: [id number] Date of Birth: Mar 18, 1951  Today's Date: 04/02/2016 PT Individual Time: [id number] PT Individual Time Calculation (min): 64 min   Short Term Goals: Week 1:  PT Short Term Goal 1 (Week 1): STG=LTG due to ELOS  Skilled Therapeutic Interventions/Progress Updates:   Pt received in recliner; premedicated and very lethargic and still c/o significant LE and low back pain.  Required mod-max A to stand from recliner with UE support on RW due to LLE positioned too far back and L knee buckling.  Performed stand pivot transfers recliner > w/c <> Nustep and mat <> w/c with mod A with RW with tactile and verbal cues for L foot placement and full L knee extension in standing for stability.  Performed bilat LE and UE strengthening, endurance and coordination on Nustep at level 4 resistance x 8 minutes.  Also performed 8 reps R and LLE terminal knee and hip extension against resistance of orange theraband in WB/standing with UE support on RW with tactile cues.    In room pt performed stand pivot transfers w/c <> toilet with UE support on grab bar and arm rest of w/c and mod A with verbal cues for safe sequencing.  Pt required assistance for clothing management but able to perform hygiene in sitting.  Pt asking questions about use of wedge on her bed at home to keep [facility] elevated; pt requesting to try on hospital bed to see if it would be beneficial before purchasing.  Large 12 inch wedge placed between frame and mattress.  Pt required mod A for sit > supine and pt unable to perform without rail currently but discussed that she would not have rail at home.  Pt positioned and left with all items within reach.  Therapy Documentation Precautions:  Precautions Precautions: Back Precaution Booklet Issued: Yes (comment) Precaution Comments: Pt recalled 3 of 3 (no lifting vs on arching) Required Braces or Orthoses: Spinal Brace Spinal  Brace: Lumbar corset, Applied in sitting position Restrictions Weight Bearing Restrictions: No Pain: Pain Assessment Pain Assessment: No/denies pain Pain Score: 8   See Function Navigator for Current Functional Status.   Therapy/Group: Individual Therapy  Raylene Everts [facility] 04/02/2016, 4:07 PM

## 2016-04-02 NOTE — Progress Notes (Addendum)
[facility]ICINE [facility] RE[facility]ABILITATION     PROGRESS NOTE    Subjective/Complaints: Pt laying comfortably in bed this AM.  She slept fairly overnight.  She feels she is doing better in rehab.    ROS: +Constipation. Denies CP, SOB, N/V/D.   Objective: Vital Signs: Blood pressure 97/62, pulse 89, temperature 98.8 F (37.1 C), temperature source Oral, resp. rate 19, height 5\' 5"  (1.651 m), weight 138.347 kg (305 lb), SpO2 99 %. No results found.  Recent Labs  03/31/16 0421  WBC 8.6  HGB 9.4*  HCT 29.8*  PLT 228    Recent Labs  03/31/16 0421  NA 140  K 3.5  CL 108  GLUCOSE 111*  BUN 10  CREATININE 0.75  CALCIUM 8.3*   CBG (last 3)   Recent Labs  04/01/16 1625 04/01/16 2120 04/02/16 0553  GLUCAP 99 104* 107*    Wt Readings from Last 3 Encounters:  03/30/16 138.347 kg (305 lb)  03/27/16 138.71 kg (305 lb 12.8 oz)  03/20/16 129 kg (284 lb 6.3 oz)    Physical Exam:  Constitutional: She appears well-developed and well-nourished. No distress. Morbidly obese  HENT: Normocephalic and atraumatic.  Eyes: Conjunctivae and Conj are normal.   Cardiovascular: Normal rate and regular rhythm. No murmur heard. Respiratory: Effort normal. No stridor. She has no wheezes.  GI: Soft. Bowel sounds are normal. There is no tenderness.  Musculoskeletal: She exhibits edema. Trace to 1+ pretibial edema  Neurological: She is alert and oriented.  B/l UE strength 4+/5.  RLE: 3+/5 proximally, 4/5 distally LLE: 4/5 proximally, 4+/5 distally Skin: Skin is warm and dry. She is not diaphoretic.  Lower back incision c/d/i. Psychiatric: Her speech is normal. Judgment and thought content normal. Less anxious. Cognition and memory are normal.   Assessment/Plan: 1. Functional and mobility deficits secondary to lumbar spondylolisthesis/radiculopathy which require 3+ hours per day of interdisciplinary therapy in a comprehensive inpatient rehab setting. Physiatrist is providing  close team supervision and 24 hour management of active medical problems listed below. Physiatrist and rehab team continue to assess barriers to discharge/monitor patient progress toward functional and medical goals.  Function:  Bathing Bathing position   Position: Shower  Bathing parts Body parts bathed by patient: Right arm, Left arm, Chest, Abdomen, Front perineal area, Right upper leg, Left upper leg Body parts bathed by helper: Buttocks, Back  Bathing assist Assist Level: Touching or steadying assistance(Pt > 75%) (Moderate assist)      Upper Body Dressing/Undressing Upper body dressing   What is the patient wearing?: Hospital gown                Upper body assist Assist Level: Set up   Set up : To obtain clothing/put away, To apply TLSO, cervical collar  Lower Body Dressing/Undressing Lower body dressing   What is the patient wearing?: [place], Non-skid slipper socks           Non-skid slipper socks- Performed by helper: Don/doff right sock, Don/doff left sock               TED Hose - Performed by helper: Don/doff right TED hose, Don/doff left TED hose  Lower body assist Assist for lower body dressing:  (Total assist)      Toileting Toileting   Toileting steps completed by patient: Adjust clothing prior to toileting, Performs perineal hygiene, Adjust clothing after toileting   Toileting Assistive Devices: Grab bar or rail  Toileting assist Assist level: Supervision  or verbal cues   Transfers Chair/bed transfer   Chair/bed transfer method: Stand pivot Chair/bed transfer assist level: Touching or steadying assistance (Pt > 75%) Chair/bed transfer assistive device: Armrests, Bedrails     Locomotion Ambulation     Max distance: 60 Assist level: Touching or steadying assistance (Pt > 75%)   Wheelchair   Type: Manual Max wheelchair distance: 121ft Assist Level: Supervision or verbal cues  Cognition Comprehension Comprehension  assist level: Follows complex conversation/direction with extra time/assistive device  Expression Expression assist level: Expresses complex ideas: With extra time/assistive device  Social Interaction Social Interaction assist level: Interacts appropriately with others with medication or extra time (anti-anxiety, antidepressant).  Problem Solving Problem solving assist level: Solves basic problems with no assist  Memory Memory assist level: Recognizes or recalls 90% of the time/requires cueing < 10% of the time   Medical Problem List and Plan: 1. Functional and mobility deficits secondary to lumbar spondlylolisthesis and radiculopathy  -Cont CIR 2. DVT Prophylaxis/Anticoagulation: Mechanical: Sequential compression devices, below knee Bilateral lower extremities  3. Pain Management: Changed hydrocodone (causes palpitations) to oxycodone prn. Scheduled tramadol for better pain control.   -robaxin helpful for pain--- changed to 750mg  qid scheduled  -utilize ice/heat also 4. Mood: Team to provide ego support. LCSW to follow for evaluation and support.  5. Neuropsych: This patient is capable of making decisions on her own behalf. 6. Skin/Wound Care: Monitor wound daily for healing. Maintain adequate nutritional and hydration status.  7. Fluids/Electrolytes/Nutrition: Cont I/Os  Will order labs for tomorow  8. T2DM--diet controlled: tight control at present  9. HTN: Monitor BP. On aldactone/HCTZ.  Cozaar--continue to hold for now. Encourage fluid intake.  10 ABLA: hgb only 9.4.  Added iron supplement.   Will order labs for tomorrow 11. Leucocytosis: Resolved  Monitor for fevers or other signs of infection.  12. Constipation:  Will increase bowel meds  LOS (Days) 3 A FACE TO FACE EVALUATION WAS PERFORMED  Ankit [name] 04/02/2016 8:59 AM

## 2016-04-02 NOTE — Progress Notes (Signed)
Physical Therapy Session Note  Patient Details  Name: Sheri Baker MRN: [id number] Date of Birth: 1951-01-28  Today's Date: 04/02/2016 PT Individual Time: 1002-1100 PT Individual Time Calculation (min): 58 min   Short Term Goals: Week 1:  PT Short Term Goal 1 (Week 1): STG=LTG due to ELOS  Skilled Therapeutic Interventions/Progress Updates:    Pt received in w/c & agreeable to PT, noting 5-6/10 back pain but reports being premedicated. Pt required total A for threading underwear & pants on BLE but able to transfer sit>stand with steady/supervision & complete donning clothing with Min A for balance & intermittent BUE support. Pt able to doff & re-don brace for putting on shirt while seated in w/c. Pt able to recall 3/4 back precautions (no bending, twisting or lifting); therapist re-educated pt on BAT acronym. W/c mobility x 100 ft with BUE for endurance training & in gym pt performed stair negotiation. Therapist educated pt on ascending stairs leading with stronger (LLE) & descending leading with weaker (RLE). Pt able to negotiate single 6 inch step with B rails & min A but unable to demonstrate proper sequencing. Pt limited by pain that increases with standing; pt reports radiating pain to buttocks & BLE's. Gait training x 80 ft + 100 ft with RW & min guard assistance with pt dragging feet on floor. Therapist instructed pt on importance of foot clearance & pt able to return demonstrate. At end of session pt returned to room via w/c total A for time management & left in w/c with all needs within reach.   Therapy Documentation Precautions:  Precautions Precautions: Back Precaution Booklet Issued: Yes (comment) Precaution Comments: Pt recalled 3 of 3 (no lifting vs on arching) Required Braces or Orthoses: Spinal Brace Spinal Brace: Lumbar corset, Applied in sitting position Restrictions Weight Bearing Restrictions: No  Pain: Pain Assessment Pain Assessment: 0-10 Pain Score: 5  Pain  Intervention(s):  (premedicated)   See Function Navigator for Current Functional Status.   Therapy/Group: Individual Therapy  [name] 04/02/2016, 7:53 AM

## 2016-04-02 NOTE — IPOC Note (Addendum)
Overall Plan of Care [facility]) Patient Details Name: Sheri Baker MRN: [id number] DOB: [birth year]  Admitting Diagnosis: Lumber Decompression Admit  Hospital Problems: Active Problems:   Spinal stenosis of lumbar region with radiculopathy   Acute blood loss anemia   Benign essential HTN   Slow transit constipation   Diabetes mellitus type 2 in obese [facility])     Functional Problem List: Nursing Pain, Safety, Endurance, Edema, Medication Management  PT Balance, Endurance, Pain, Safety  OT Balance, Pain, Safety, Endurance, Motor  SLP    TR         Basic ADL's: OT Grooming, Bathing, Dressing, Toileting     Advanced  ADL's: OT Simple Meal Preparation, Laundry, Light Housekeeping     Transfers: PT Bed Mobility, Bed to Chair, Car, Manufacturing systems engineer, Metallurgist: PT Ambulation, Emergency planning/management officer, Stairs     Additional Impairments: OT None  SLP        TR      Anticipated Outcomes Item Anticipated Outcome  Self Feeding Independent  Swallowing      Basic self-care  Mod I  Toileting  Mod I   Bathroom Transfers Mod I  Bowel/Bladder  Patient to remain continent of bowel & bladder  Transfers  Mod I with RW  Locomotion  Mod I- supervision with RW  Communication     Cognition     Pain  Pain scale <4  Safety/Judgment  Patient will not have an falls with injury   Therapy Plan: PT Intensity: Minimum of 1-2 x/day ,45 to 90 minutes PT Frequency: 5 out of 7 days PT Duration Estimated Length of Stay: 8-12days  OT Intensity: Minimum of 1-2 x/day, 45 to 90 minutes OT Frequency: 5 out of 7 days OT Duration/Estimated Length of Stay: 7-10 days         Team Interventions: Nursing Interventions Patient/Family Education, Pain Management, Medication Management, Skin Care/Wound Management  PT interventions Ambulation/gait training, Training and development officer, Cognitive remediation/compensation, Community reintegration, Discharge planning, Disease  management/prevention, Functional mobility training, Neuromuscular re-education, Patient/family education, Pain management, Psychosocial support, Skin care/wound management, Splinting/orthotics, Stair training, Therapeutic Activities, Therapeutic Exercise, UE/LE Strength taining/ROM, UE/LE Coordination activities, Visual/perceptual remediation/compensation, Wheelchair propulsion/positioning  OT Interventions Training and development officer, Self Care/advanced ADL retraining, Functional mobility training, Pain management, Therapeutic Activities, Therapeutic Exercise, DME/adaptive equipment instruction, Discharge planning  SLP Interventions    TR Interventions    SW/CM Interventions  Psychosocial Assessment, Pt/Family Education & Discharge Planning    Team Discharge Planning: Destination: PT-Home ,OT- Home , SLP-  Projected Follow-up: PT-Home health PT, OT-  Home health OT, SLP-  Projected Equipment Needs: PT-Rolling walker with 5" wheels, Wheelchair (measurements), Wheelchair cushion (measurements), To be determined, OT- Tub/shower bench, SLP-  Equipment Details: PT- , OT-  Patient/family involved in discharge planning: PT- Patient,  OT-Patient, SLP-   MD ELOS: 4-7 days. Medical Rehab Prognosis:  Excellent Assessment:  65 y.o. 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 Patient elected to undergo decompressive laminectomy L4-L5, L5/S1 with decompression of L4, L5 and S1 nerve roots on 03/27/16 by Dr. Ellene Route. She continued to have issues with pain control and was started on Toredol to help manage symptoms. Post op with ABLA and leucocytosis with WBC up to 13.6.  Pt with resulting functional deficits in gait, weakness, and endurance. Will set goals for Mod I with PT/OT.    See Team Conference  Notes for weekly updates to the plan of care

## 2016-04-03 ENCOUNTER — Inpatient Hospital Stay (HOSPITAL_COMMUNITY): Payer: BLUE CROSS/BLUE SHIELD | Admitting: Occupational Therapy

## 2016-04-03 ENCOUNTER — Inpatient Hospital Stay (HOSPITAL_COMMUNITY): Payer: BLUE CROSS/BLUE SHIELD | Admitting: Physical Therapy

## 2016-04-03 DIAGNOSIS — G5791 Unspecified mononeuropathy of right lower limb: Secondary | ICD-10-CM

## 2016-04-03 DIAGNOSIS — G5793 Unspecified mononeuropathy of bilateral lower limbs: Secondary | ICD-10-CM | POA: Diagnosis present

## 2016-04-03 DIAGNOSIS — D72829 Elevated white blood cell count, unspecified: Secondary | ICD-10-CM | POA: Insufficient documentation

## 2016-04-03 DIAGNOSIS — E871 Hypo-osmolality and hyponatremia: Secondary | ICD-10-CM

## 2016-04-03 DIAGNOSIS — G5792 Unspecified mononeuropathy of left lower limb: Secondary | ICD-10-CM

## 2016-04-03 LAB — CBC WITH DIFFERENTIAL/PLATELET
Basophils Absolute: 0 10*3/uL (ref 0.0–0.1)
Basophils Relative: 0 %
Eosinophils Absolute: 0.2 10*3/uL (ref 0.0–0.7)
Eosinophils Relative: 2 %
HCT: 30.9 % — ABNORMAL LOW (ref 36.0–46.0)
Hemoglobin: 10 g/dL — ABNORMAL LOW (ref 12.0–15.0)
Lymphocytes Relative: 23 %
Lymphs Abs: 2.5 10*3/uL (ref 0.7–4.0)
MCH: 27.4 pg (ref 26.0–34.0)
MCHC: 32.4 g/dL (ref 30.0–36.0)
MCV: 84.7 fL (ref 78.0–100.0)
Monocytes Absolute: 0.9 10*3/uL (ref 0.1–1.0)
Monocytes Relative: 9 %
Neutro Abs: 7.1 10*3/uL (ref 1.7–7.7)
Neutrophils Relative %: 66 %
Platelets: 315 10*3/uL (ref 150–400)
RBC: 3.65 MIL/uL — ABNORMAL LOW (ref 3.87–5.11)
RDW: 13.4 % (ref 11.5–15.5)
WBC: 10.6 10*3/uL — ABNORMAL HIGH (ref 4.0–10.5)

## 2016-04-03 LAB — BASIC METABOLIC PANEL
Anion gap: 9 (ref 5–15)
BUN: 10 mg/dL (ref 6–20)
CO2: 27 mmol/L (ref 22–32)
Calcium: 8.9 mg/dL (ref 8.9–10.3)
Chloride: 98 mmol/L — ABNORMAL LOW (ref 101–111)
Creatinine, Ser: 0.85 mg/dL (ref 0.44–1.00)
GFR calc Af Amer: >60 mL/min (ref >60)
GFR calc non Af Amer: >60 mL/min (ref >60)
Glucose, Bld: 107 mg/dL — ABNORMAL HIGH (ref 65–99)
Potassium: 3.6 mmol/L (ref 3.5–5.1)
Sodium: 134 mmol/L — ABNORMAL LOW (ref 135–145)

## 2016-04-03 LAB — GLUCOSE, CAPILLARY
Glucose-Capillary: 94 mg/dL (ref 65–99)
Glucose-Capillary: 96 mg/dL (ref 65–99)
Glucose-Capillary: 100 mg/dL — ABNORMAL HIGH (ref 65–99)
Glucose-Capillary: 127 mg/dL — ABNORMAL HIGH (ref 65–99)

## 2016-04-03 MED ORDER — GABAPENTIN 300 MG PO CAPS
600.0000 mg | ORAL_CAPSULE | Freq: Three times a day (TID) | ORAL | Status: DC
  Administered 2016-04-03 – 2016-04-12 (×27): 600 mg via ORAL
  Filled 2016-04-03 (×28): qty 2

## 2016-04-03 NOTE — Progress Notes (Signed)
[place] PHYSICAL MEDICINE & REHABILITATION     PROGRESS NOTE    Subjective/Complaints: Patient lying in bed this morning. She states that she slept well for the first part of the night, but that was awoken and was not able to go back to sleep. She states that while she was lying in bed she was doing some exercises and believes she has had improvement in strength. She does complain of bilateral lower extremity neuropathic pain.  ROS: + Neuropathic pain. Denies CP, SOB, N/V/D.   Objective: Vital Signs: Blood pressure 120/63, pulse 97, temperature 98.3 F (36.8 C), temperature source Oral, resp. rate 18, height 5\' 5"  (1.651 m), weight 138.347 kg (305 lb), SpO2 100 %. No results found.  Recent Labs  04/03/16 0353  WBC 10.6*  HGB 10.0*  HCT 30.9*  PLT 315    Recent Labs  04/03/16 0353  NA 134*  K 3.6  CL 98*  GLUCOSE 107*  BUN 10  CREATININE 0.85  CALCIUM 8.9   CBG (last 3)   Recent Labs  04/02/16 1616 04/02/16 2034 04/03/16 0642  GLUCAP 98 121* 94    Wt Readings from Last 3 Encounters:  03/30/16 138.347 kg (305 lb)  03/27/16 138.71 kg (305 lb 12.8 oz)  03/20/16 129 kg (284 lb 6.3 oz)    Physical Exam:  Constitutional: She appears well-developed and well-nourished. No distress. Morbidly obese  HENT: Normocephalic and atraumatic.  Eyes: Conjunctivae and Conj are normal.   Cardiovascular: Normal rate and regular rhythm. No murmur heard. Respiratory: Effort normal. No stridor. She has no wheezes.  GI: Soft. Bowel sounds are normal. There is no tenderness.  Musculoskeletal: She exhibits edema. Trace to 1+ pretibial edema  Neurological: She is alert and oriented.  B/l UE strength 4+/5.  RLE: 4-/5 proximally, 4/5 distally LLE: 4/5 proximally, 4+/5 distally Skin: Skin is warm and dry. She is not diaphoretic.  Lower back incision c/d/i. Psychiatric: Her speech is normal. Judgment and thought content normal. Less anxious. Cognition and memory are normal.    Assessment/Plan: 1. Functional and mobility deficits secondary to lumbar spondylolisthesis/radiculopathy which require 3+ hours per day of interdisciplinary therapy in a comprehensive inpatient rehab setting. Physiatrist is providing close team supervision and 24 hour management of active medical problems listed below. Physiatrist and rehab team continue to assess barriers to discharge/monitor patient progress toward functional and medical goals.  Function:  Bathing Bathing position   Position: Shower  Bathing parts Body parts bathed by patient: Right arm, Left arm, Chest, Abdomen, Front perineal area, Right upper leg, Left upper leg Body parts bathed by helper: Buttocks, Back  Bathing assist Assist Level: Touching or steadying assistance(Pt > 75%) (Moderate assist)      Upper Body Dressing/Undressing Upper body dressing   What is the patient wearing?: Hospital gown                Upper body assist Assist Level: Set up   Set up : To obtain clothing/put away, To apply TLSO, cervical collar  Lower Body Dressing/Undressing Lower body dressing   What is the patient wearing?: [place], Non-skid slipper socks           Non-skid slipper socks- Performed by helper: Don/doff right sock, Don/doff left sock               TED Hose - Performed by helper: Don/doff right TED hose, Don/doff left TED hose  Lower body assist Assist for lower body dressing:  (Total  assist)      Toileting Toileting   Toileting steps completed by patient: Adjust clothing prior to toileting, Performs perineal hygiene, Adjust clothing after toileting Toileting steps completed by helper: Adjust clothing prior to toileting, Adjust clothing after toileting Toileting Assistive Devices: Grab bar or rail  Toileting assist Assist level: Supervision or verbal cues   Transfers Chair/bed transfer   Chair/bed transfer method: Stand pivot Chair/bed transfer assist level: Supervision or  verbal cues Chair/bed transfer assistive device: Armrests, Medical sales representative     Max distance: 16 Assist level: Supervision or verbal cues   Wheelchair   Type: Manual Max wheelchair distance: 129ft Assist Level: Supervision or verbal cues  Cognition Comprehension Comprehension assist level: Follows complex conversation/direction with extra time/assistive device  Expression Expression assist level: Expresses complex ideas: With extra time/assistive device  Social Interaction Social Interaction assist level: Interacts appropriately with others with medication or extra time (anti-anxiety, antidepressant).  Problem Solving Problem solving assist level: Solves basic problems with no assist  Memory Memory assist level: Recognizes or recalls 90% of the time/requires cueing < 10% of the time   Medical Problem List and Plan: 1. Functional and mobility deficits secondary to lumbar spondlylolisthesis and radiculopathy  -Cont CIR 2. DVT Prophylaxis/Anticoagulation: Mechanical: Sequential compression devices, below knee Bilateral lower extremities  3. Pain Management: Changed hydrocodone (causes palpitations) to oxycodone prn. Scheduled tramadol for better pain control.   -robaxin helpful for pain--- changed to 750mg  qid scheduled  -utilize ice/heat also  -Gabapentin increased to 600 3 times a day at 6/13  4. Mood: Team to provide ego support. LCSW to follow for evaluation and support.  5. Neuropsych: This patient is capable of making decisions on her own behalf. 6. Skin/Wound Care: Monitor wound daily for healing. Maintain adequate nutritional and hydration status.  7. Fluids/Electrolytes/Nutrition: Cont I/Os  Mild hyponatremia: 134 on 2/13. We will order labs for tomorrow.  8. T2DM--diet controlled: tight control at present  9. HTN: Monitor BP. On aldactone/HCTZ.  Cozaar--continue to hold. Encourage fluid intake.  10 ABLA:  Added iron supplement.   Hemoglobin 10.0  on 6/13 (improving) 11. Leucocytosis:   No fevers or other signs of infection.   WBCs 10.6 on 6/13  Will continue to monitor 12. Constipation:  Increased bowel meds on 6/12, with results  LOS (Days) 4 A FACE TO FACE EVALUATION WAS PERFORMED  Ankit [name] 04/03/2016 8:31 AM

## 2016-04-03 NOTE — Progress Notes (Signed)
Physical Therapy Session Note  Patient Details  Name: Sheri Baker MRN: [id number] Date of Birth: Feb 12, 1951  Today's Date: 04/03/2016 PT Individual Time: [id number] PT Individual Time Calculation (min): 73 min   Short Term Goals: Week 1:  PT Short Term Goal 1 (Week 1): STG=LTG due to ELOS  Skilled Therapeutic Interventions/Progress Updates:    Pt received in w/c & agreeable to PT, noting 5/10 pain at rest that increases to 8/10 with activity. Discussed pain management with pt & RN. Provided steady A for sit<>stand transfers from w/c & gait training x 50 ft + 20 ft with RW & min A but pt limited by increased buttock & BLE pain. Pt self propelled w/c x 100 ft with BUE & supervision to gym. Educated pt on stair negotiation with compensatory strategy & pt able to negotiate 4 steps x 2 trials with seated rest break in between & min A during activity. Focused on pt's standing tolerance while playing game & pt able to do so x3 minutes 41 seconds with 1 UE support and 3 minutes 45 seconds without BUE support with pt noting great difficulty standing without UE support. Gait training x 100 ft back towards room with steady A & RW. Pt with significantly decreased gait speed & step length BLE. At end of session pt left in room with multiple friends present.   Therapy Documentation Precautions:  Precautions Precautions: Back Precaution Booklet Issued: Yes (comment) Precaution Comments: Pt recalled 3 of 3 (no lifting vs on arching) Required Braces or Orthoses: Spinal Brace Spinal Brace: Lumbar corset, Applied in sitting position Restrictions Weight Bearing Restrictions: No  See Function Navigator for Current Functional Status.   Therapy/Group: Individual Therapy  [name] 04/03/2016, 12:53 PM

## 2016-04-03 NOTE — Evaluation (Signed)
Recreational Therapy Assessment and Plan  Patient Details  Name: Sheri Baker MRN: [id number] Date of Birth: 1950/11/29 Today's Date: 04/03/2016  Rehab Potential: Good ELOS:     Assessment Clinical Impression:  Problem List:  Patient Active Problem List   Diagnosis Date Noted  . Spinal stenosis of lumbar region with radiculopathy 03/30/2016  . Spondylolisthesis of lumbar region 03/27/2016    Past Medical History:  Past Medical History  Diagnosis Date  . Hypertension   . Dysrhythmia     "palpations with full dose of hydrocodone"- takes 1/2 of Hydrocodone- Acetaminophen and does not have palpations  . 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 Hypothyroidism   . GERD (gastroesophageal reflux disease)   . Constipation   . History of hiatal hernia   . Hemorrhoids   . Anemia   . Arthritis   . Headache     occasional   Past Surgical History:  Past Surgical History  Procedure Laterality Date  . Foot surgery Left     Callus- reaset bone  . Abdominal hysterectomy      partial -right ovary remains  . Tonsillectomy    . Cervix removal    . Colonoscopy      Assessment & Plan Clinical Impression: Patient is a 65 y.o. 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 Patient elected to undergo decompressive laminectomy L4-L5, L5/S1 with decompression of L4, L5 and S1 nerve roots on 03/27/16 by Dr. Ellene Route. She continued to have issues with pain control and was started on Toredol to help manage symptoms. Post op with ABLA and leucocytosis with WBC up to 13.6. Patient transferred to CIR on 03/30/2016.   Pt presents with decreased activity tolerance, decreased functional mobility, decreased balance, difficulty maintaining precautions  Limiting pt's independence with leisure/community pursuits.      Leisure History/Participation Premorbid leisure interest/current participation: Medical laboratory scientific officer - Building control surveyor - Doctor, hospital - Travel (Comment);Community - Water quality scientist Interests: Music (Comment) Other Leisure Interests: Movies Leisure Participation Style: With Family/Friends Awareness of Community Resources: Excellent Psychosocial / Spiritual Spiritual Interests: Church;Womens'Men's Groups Patient agreeable to Pet Therapy: Yes Does patient have pets?: No Social interaction - Mood/Behavior: Cooperative Engineer, drilling for Education?: Yes Recreational Therapy Orientation Orientation -Reviewed with patient: Available activity resources Strengths/Weaknesses Patient Strengths/Abilities: Willingness to participate;Active premorbidly Patient weaknesses: Physical limitations TR Patient demonstrates impairments in the following area(s): Endurance;Motor;Pain;Safety;Skin Integrity  Plan Rec Therapy Plan Is patient appropriate for Therapeutic Recreation?: Yes Rehab Potential: Good Treatment times per week: Min 1 time per week >20 minutes TR Treatment/Interventions: Adaptive equipment instruction;1:1 session;Balance/vestibular training;Functional mobility training;Community reintegration;Patient/family education;Therapeutic activities;Recreation/leisure participation;Therapeutic exercise;UE/LE Coordination activities  Recommendations for other services: None  Discharge Criteria: Patient will be discharged from TR if patient refuses treatment 3 consecutive times without medical reason.  If treatment goals not met, if there is a change in medical status, if patient makes no progress towards goals or if patient is discharged from hospital.  The above assessment, treatment plan, treatment alternatives and goals were discussed and mutually agreed upon: by  patient  [place] 04/03/2016, 3:25 PM

## 2016-04-03 NOTE — Progress Notes (Signed)
Occupational Therapy Session Note  Patient Details  Name: Sheri Baker MRN: [id number] Date of Birth: 04-29-1951  Today's Date: 04/03/2016 OT Individual Time: 8676-1950 and 1105-1200 OT Individual Time Calculation (min): 65 min and 55 min   Short Term Goals: Week 1:  OT Short Term Goal 1 (Week 1): Bathe seated on tub bench using AE prn with min assist for thoroughness OT Short Term Goal 2 (Week 1): Dress lower body, sitting and standing using AE prn to maintian back precautions OT Short Term Goal 3 (Week 1): Complete light meal prep using LRAD and AE prn with instructional cues OT Short Term Goal 4 (Week 1): Maintain back precautions 100% of the time during performance of BADL for 1 hour with min vc  Skilled Therapeutic Interventions/Progress Updates:    Visit 1: 8/10 pain  In sciatic region, pt premedicated Pt seen for skilled OT to facilitate functional mobility and balance with ADL retraining. Pt receiving meds from RN at start of session as tub room set up for pt to use. Pt sat to EOB with S and ambulated with RW to toilet with close S. Min A to stand from low toilet. Pt may benefit from elevated seat. Pt propelled self in w/c to tub room. Used RW to ambulate a few feet to tub bench. Min A to lift R leg into tub. TLSO removed. Pt then showered with min A.   Pt dried off and donned gown and brace. Pt transferred to w/c. Pt very tired and needed to move slowly during session. Deferred dressing until 2nd session. Pt propelled back to room without A.  Visit 2: 8/10 pain in sciatic region, pt due to receive pain meds at 1200 Pt completed ADLS with grooming at sink and then donning clothing using reacher for pants and sock aid for socks. Pt then engaged in UE strengthening  Using a 2 lb dowel bar focusing on shoulder and tricep exercises. Pt in room in with all needs met.   Therapy Documentation Precautions:  Precautions Precautions: Back Precaution Booklet Issued: Yes (comment) Precaution  Comments: Pt recalled 3 of 3 (no lifting vs on arching) Required Braces or Orthoses: Spinal Brace Spinal Brace: Lumbar corset, Applied in sitting position Restrictions Weight Bearing Restrictions: No  Vital Signs: Therapy Vitals Temp: 98.3 F (36.8 C) Temp Source: Oral Pulse Rate: 97 Resp: 18 BP: 120/63 mmHg Patient Position (if appropriate): Lying Oxygen Therapy SpO2: 100 % O2 Device: Not Delivered ADL: ADL ADL Comments: see Assessment Tool  See Function Navigator for Current Functional Status.   Therapy/Group: Individual Therapy  SAGUIER,JULIA 04/03/2016, 8:27 AM

## 2016-04-04 ENCOUNTER — Inpatient Hospital Stay (HOSPITAL_COMMUNITY): Payer: BLUE CROSS/BLUE SHIELD | Admitting: Occupational Therapy

## 2016-04-04 ENCOUNTER — Inpatient Hospital Stay (HOSPITAL_COMMUNITY): Payer: BLUE CROSS/BLUE SHIELD | Admitting: *Deleted

## 2016-04-04 ENCOUNTER — Inpatient Hospital Stay (HOSPITAL_COMMUNITY): Payer: BLUE CROSS/BLUE SHIELD | Admitting: Physical Therapy

## 2016-04-04 DIAGNOSIS — G8918 Other acute postprocedural pain: Secondary | ICD-10-CM

## 2016-04-04 DIAGNOSIS — M4316 Spondylolisthesis, lumbar region: Secondary | ICD-10-CM

## 2016-04-04 LAB — CBC WITH DIFFERENTIAL/PLATELET
Basophils Absolute: 0 10*3/uL (ref 0.0–0.1)
Basophils Relative: 0 %
Eosinophils Absolute: 0.2 10*3/uL (ref 0.0–0.7)
Eosinophils Relative: 2 %
HCT: 31 % — ABNORMAL LOW (ref 36.0–46.0)
Hemoglobin: 9.9 g/dL — ABNORMAL LOW (ref 12.0–15.0)
Lymphocytes Relative: 24 %
Lymphs Abs: 2.1 10*3/uL (ref 0.7–4.0)
MCH: 27 pg (ref 26.0–34.0)
MCHC: 31.9 g/dL (ref 30.0–36.0)
MCV: 84.7 fL (ref 78.0–100.0)
Monocytes Absolute: 0.8 10*3/uL (ref 0.1–1.0)
Monocytes Relative: 9 %
Neutro Abs: 5.8 10*3/uL (ref 1.7–7.7)
Neutrophils Relative %: 65 %
Platelets: 233 10*3/uL (ref 150–400)
RBC: 3.66 MIL/uL — ABNORMAL LOW (ref 3.87–5.11)
RDW: 13.3 % (ref 11.5–15.5)
WBC: 8.9 10*3/uL (ref 4.0–10.5)

## 2016-04-04 LAB — GLUCOSE, CAPILLARY
Glucose-Capillary: 102 mg/dL — ABNORMAL HIGH (ref 65–99)
Glucose-Capillary: 101 mg/dL — ABNORMAL HIGH (ref 65–99)
Glucose-Capillary: 87 mg/dL (ref 65–99)
Glucose-Capillary: 111 mg/dL — ABNORMAL HIGH (ref 65–99)

## 2016-04-04 LAB — BASIC METABOLIC PANEL
Anion gap: 9 (ref 5–15)
BUN: 8 mg/dL (ref 6–20)
CO2: 28 mmol/L (ref 22–32)
Calcium: 8.8 mg/dL — ABNORMAL LOW (ref 8.9–10.3)
Chloride: 98 mmol/L — ABNORMAL LOW (ref 101–111)
Creatinine, Ser: 0.84 mg/dL (ref 0.44–1.00)
GFR calc Af Amer: >60 mL/min (ref >60)
GFR calc non Af Amer: >60 mL/min (ref >60)
Glucose, Bld: 102 mg/dL — ABNORMAL HIGH (ref 65–99)
Potassium: 3.7 mmol/L (ref 3.5–5.1)
Sodium: 135 mmol/L (ref 135–145)

## 2016-04-04 MED ORDER — OXYCODONE HCL 5 MG PO TABS
5.0000 mg | ORAL_TABLET | ORAL | Status: DC | PRN
  Administered 2016-04-04 – 2016-04-14 (×31): 10 mg via ORAL
  Filled 2016-04-04 (×34): qty 2

## 2016-04-04 MED ORDER — OXYCODONE HCL ER 10 MG PO T12A
10.0000 mg | EXTENDED_RELEASE_TABLET | Freq: Two times a day (BID) | ORAL | Status: DC
  Administered 2016-04-04 – 2016-04-18 (×29): 10 mg via ORAL
  Filled 2016-04-04 (×29): qty 1

## 2016-04-04 NOTE — Progress Notes (Signed)
[place] PHYSICAL MEDICINE & REHABILITATION     PROGRESS NOTE    Subjective/Complaints: Patient seen sitting up in bed this morning. She states she feels "pretty good". She does note that overnight she felt clammy.  ROS: Denies CP, SOB, N/V/D.   Objective: Vital Signs: Blood pressure 117/66, pulse 98, temperature 98.7 F (37.1 C), temperature source Oral, resp. rate 18, height 5\' 5"  (1.651 m), weight 137.576 kg (303 lb 4.8 oz), SpO2 100 %. No results found.  Recent Labs  04/03/16 0353 04/04/16 0556  WBC 10.6* 8.9  HGB 10.0* 9.9*  HCT 30.9* 31.0*  PLT 315 233    Recent Labs  04/03/16 0353 04/04/16 0556  NA 134* 135  K 3.6 3.7  CL 98* 98*  GLUCOSE 107* 102*  BUN 10 8  CREATININE 0.85 0.84  CALCIUM 8.9 8.8*   CBG (last 3)   Recent Labs  04/03/16 1703 04/03/16 2041 04/04/16 0642  GLUCAP 100* 127* 102*    Wt Readings from Last 3 Encounters:  04/04/16 137.576 kg (303 lb 4.8 oz)  03/27/16 138.71 kg (305 lb 12.8 oz)  03/20/16 129 kg (284 lb 6.3 oz)    Physical Exam:  Constitutional: She appears well-developed and well-nourished. No distress. Morbidly obese  HENT: Normocephalic and atraumatic.  Eyes: Conjunctivae and Conj are normal.   Cardiovascular: Normal rate and regular rhythm. No murmur heard. Respiratory: Effort normal. No stridor. She has no wheezes.  GI: Soft. Bowel sounds are normal. There is no tenderness.  Musculoskeletal: She exhibits edema. Trace to 1+ pretibial edema  Neurological: She is alert and oriented.  B/l UE strength 4+/5.  RLE: 3/5 proximally, 4+/5 distally LLE: 4-/5 proximally, 4+/5 distally Skin: Skin is warm and dry. She is not diaphoretic.  Lower back incision c/d/i. Psychiatric: Her speech is normal. Judgment and thought content normal. Less anxious. Cognition and memory are normal.   Assessment/Plan: 1. Functional and mobility deficits secondary to lumbar spondylolisthesis/radiculopathy which require 3+ hours per day  of interdisciplinary therapy in a comprehensive inpatient rehab setting. Physiatrist is providing close team supervision and 24 hour management of active medical problems listed below. Physiatrist and rehab team continue to assess barriers to discharge/monitor patient progress toward functional and medical goals.  Function:  Bathing Bathing position   Position: Shower  Bathing parts Body parts bathed by patient: Right arm, Left arm, Chest, Abdomen, Front perineal area, Right upper leg, Left upper leg, Right lower leg, Left lower leg Body parts bathed by helper: Buttocks, Back  Bathing assist Assist Level: Touching or steadying assistance(Pt > 75%) (Moderate assist)      Upper Body Dressing/Undressing Upper body dressing   What is the patient wearing?: Pull over shirt/dress, Orthosis     Pull over shirt/dress - Perfomed by patient: Thread/unthread right sleeve, Thread/unthread left sleeve, Put head through opening, Pull shirt over trunk       Orthosis activity level: Performed by patient (set up by helper)  Upper body assist Assist Level: Set up   Set up : To obtain clothing/put away, To apply TLSO, cervical collar  Lower Body Dressing/Undressing Lower body dressing   What is the patient wearing?: Pants, Non-skid slipper socks, Ted Hose     Pants- Performed by patient: Thread/unthread right pants leg, Thread/unthread left pants leg, Pull pants up/down   Non-skid slipper socks- Performed by patient: Don/doff right sock, Don/doff left sock Non-skid slipper socks- Performed by helper: Don/doff right sock, Don/doff left sock  TED Hose - Performed by helper: Don/doff right TED hose, Don/doff left TED hose  Lower body assist Assist for lower body dressing: Assistive device, Supervision or verbal cues      Toileting Toileting   Toileting steps completed by patient: Adjust clothing prior to toileting, Performs perineal hygiene, Adjust clothing after  toileting Toileting steps completed by helper: Adjust clothing prior to toileting, Adjust clothing after toileting Toileting Assistive Devices: Grab bar or rail  Toileting assist Assist level: Supervision or verbal cues   Transfers Chair/bed transfer   Chair/bed transfer method: Ambulatory Chair/bed transfer assist level: Touching or steadying assistance (Pt > 75%) Chair/bed transfer assistive device: Armrests, Bedrails     Locomotion Ambulation     Max distance: 100 ft Assist level: Touching or steadying assistance (Pt > 75%)   Wheelchair   Type: Manual Max wheelchair distance: 100 ft Assist Level: Supervision or verbal cues  Cognition Comprehension Comprehension assist level: Follows complex conversation/direction with extra time/assistive device  Expression Expression assist level: Expresses complex ideas: With extra time/assistive device  Social Interaction Social Interaction assist level: Interacts appropriately with others with medication or extra time (anti-anxiety, antidepressant).  Problem Solving Problem solving assist level: Solves basic problems with no assist  Memory Memory assist level: Recognizes or recalls 90% of the time/requires cueing < 10% of the time   Medical Problem List and Plan: 1. Functional and mobility deficits secondary to lumbar spondlylolisthesis and radiculopathy  -Cont CIR 2. DVT Prophylaxis/Anticoagulation: Mechanical: Sequential compression devices, below knee Bilateral lower extremities  3. Pain Management: Changed hydrocodone (causes palpitations) to oxycodone prn. Scheduled tramadol for better pain control.   -robaxin helpful for pain--- changed to 750mg  qid scheduled  -utilize ice/heat also  -Gabapentin increased to 600 3 times a day at 6/13With improvement in symptoms  4. Mood: Team to provide ego support. LCSW to follow for evaluation and support.  5. Neuropsych: This patient is capable of making decisions on her own behalf. 6.  Skin/Wound Care: Monitor wound daily for healing. Maintain adequate nutritional and hydration status.  7. Fluids/Electrolytes/Nutrition: Cont I/Os  Mild hyponatremia: 135 on 6/14.   8. T2DM--diet controlled: tight control at present  9. HTN: Monitor BP. On aldactone/HCTZ.  Cozaar--continue to hold. Encourage fluid intake.  10 ABLA:  Added iron supplement.   Hemoglobin 9.9 on 6/14 (stable) 11. Leucocytosis: Resolved  No fevers or other signs of infection.   WBCs 8.9 on 6/14  Will continue to monitor 12. Constipation:  Increased bowel meds on 6/12, with results  LOS (Days) 5 A FACE TO FACE EVALUATION WAS PERFORMED  Ankit [name] 04/04/2016 8:49 AM

## 2016-04-04 NOTE — Progress Notes (Signed)
Therapy reporting that pain continues to be a limiting factor with poor pain relief reported. Has used 30 mg oxycodone in the last 24 hours--Will add OxyContin 10 mg bid for more consistent relief and monitor.

## 2016-04-04 NOTE — Progress Notes (Signed)
Physical Therapy Session Note  Patient Details  Name: Sheri Baker MRN: [id number] Date of Birth: 04/09/1951  Today's Date: 04/04/2016 PT Individual Time: [id number] and [id number] PT Individual Time Calculation (min): 60 min and 61 min  Short Term Goals: Week 1:  PT Short Term Goal 1 (Week 1): STG=LTG due to ELOS  Skilled Therapeutic Interventions/Progress Updates:    Treatment 1: Pt received in recliner & agreeable to PT, noting 5/10 pain at rest that increased to 8/10 with transfers/standing. This PT informed PA of pt's continued c/o significant pain with activity. Pt able to thread pants on BLE with use of reacher & supervision; pt able to complete donning pants in standing with supervision A. Significant pt education completed regarding pt's pain being pt's biggest limiting factor & potential need for 24 hr assistance longer than 1 week that sister plans to provide upon d/c. Educated pt on need for assistance at home with all functional mobility and that pt would be unable to even transfer to [facility] without assistance at this point due to pain in back/buttocks/BLE causing her to have increased difficulty with minimal movements. Pt became emotional, reporting she does not want to be a burden to her sister & therapist provided therapeutic listening. Gait training x 150 ft + 50 ft with RW & steady A with therapist encouraging pt to increase gait speed & cued pt for foot clearance. At end of session pt left in room with all needs within reach.   Treatment 2: Pt received in recliner & agreeable to PT. Pt noted 4-5/10 back pain at rest & with activity; pt notes this is an improvement. Gait training x 120 ft with RW & steady A/supervision to elevators. Transported pt down to gift shop & pt able to tolerate ambulating/standing in gift shop for 22 minutes with supervision/intermittent steady A before requiring a seated rest break. Educated pt on turning sideways & side-stepping in narrow spaces with her  demonstrating technique with steady A. Transported pt back to unit & pt transferred w/c>nu-step with RW & supervision A requiring cues to back up to seat then transfer stand>sit. Utilized nu-step on level 6 x 10 minutes, reporting 13 on Borg RPE scale; activity focused on BLE strengthening, endurance & coordination. Pt completed transfer in same manner as noted above & transported back to room. Pt transferred w/c>recliner with RW & pt left with all needs within reach.   Therapy Documentation Precautions:  Precautions Precautions: Back Precaution Booklet Issued: Yes (comment) Precaution Comments: Pt recalled 3 of 3 (no lifting vs on arching) Required Braces or Orthoses: Spinal Brace Spinal Brace: Lumbar corset, Applied in sitting position Restrictions Weight Bearing Restrictions: No   See Function Navigator for Current Functional Status.   Therapy/Group: Individual Therapy  [name] 04/04/2016, 7:54 AM

## 2016-04-04 NOTE — Progress Notes (Signed)
Occupational Therapy Session Note  Patient Details  Name: Sheri Baker MRN: [id number] Date of Birth: [birth year]  Today's Date: 04/04/2016 OT Individual Time:  -       Short Term Goals: Week 1:  OT Short Term Goal 1 (Week 1): Bathe seated on tub bench using AE prn with min assist for thoroughness OT Short Term Goal 2 (Week 1): Dress lower body, sitting and standing using AE prn to maintian back precautions OT Short Term Goal 3 (Week 1): Complete light meal prep using LRAD and AE prn with instructional cues OT Short Term Goal 4 (Week 1): Maintain back precautions 100% of the time during performance of BADL for 1 hour with min vc :    :     Skilled Therapeutic Interventions/Progress Updates:    Pt participated in tx today focusing on self care mgt by completing bathing in shower room. Pt able to recall back precautions 3/3 at inception of tx. Pt reported self transferring from w/c to chair in room without nursing A and was provided ed on importance of notifying nursing to complete transfers safely with pt verbalizing understanding. Pt able to don/doff back brace with cuing for initiation while seated on tub bench. Transfer completed from w/c to tub bench with RW and Min A for sit to stand. UB bathing completed with S for adherence to back precautions utilizing appropriate use of AE for completion. Pt required Min A only for thoroughness with pericare with instruction on lateral leaning. 5/10 pain reported today with pt reporting pain is improving. Pt continues to benefit from AE training to improve efficiency and safety during ADL tasks. Pt able to don pants with use of AE standing prn with RW and back brace. Pt left in room chair with nursing phone and call bell within reach.   Therapy Documentation Precautions:  Precautions Precautions: Back Precaution Booklet Issued: Yes (comment) Precaution Comments: Pt recalled 3 of 3 (no lifting vs on arching) Required Braces or Orthoses: Spinal  Brace Spinal Brace: Lumbar corset, Applied in sitting position Restrictions Weight Bearing Restrictions: No General:   Vital Signs: Therapy Vitals Temp: 98.6 F (37 C) Temp Source: Oral Pulse Rate: 98 Resp: 17 BP: 112/73 mmHg Patient Position (if appropriate): Sitting Oxygen Therapy SpO2: 98 % O2 Device: Not Delivered Pain: 5/10 pain with provided recovery periods   See Function Navigator for Current Functional Status.   Therapy/Group: Individual Therapy  Michaela A Hoffman 04/04/2016, 2:30 PM

## 2016-04-04 NOTE — Patient Care Conference (Signed)
Inpatient RehabilitationTeam Conference and Plan of Care Update Date: 04/04/2016   Time: 2:20 pm    Patient Name: Sheri Baker      Medical Record Number: [id number]  Date of Birth: 10/31/1950 Sex: Female         Room/Bed: 4W24C/4W24C-01 Payor Info: Payor: [place] / Plan: [employer] OTHER / Product Type: *No Product type* /    Admitting Diagnosis: Lumber Decompression Admit  Admit Date/Time:  03/30/2016  5:24 PM Admission Comments: No comment available   Primary Diagnosis:  Spinal stenosis of lumbar region with radiculopathy Principal Problem: Spinal stenosis of lumbar region with radiculopathy  Patient Active Problem List   Diagnosis Date Noted  . Post-operative pain   . Neuropathic pain of both legs   . Leukocytosis   . Hyponatremia   . Acute blood loss anemia   . Benign essential HTN   . Slow transit constipation   . Diabetes mellitus type 2 in obese ([place])   . Spinal stenosis of lumbar region with radiculopathy 03/30/2016  . Spondylolisthesis of lumbar region 03/27/2016    Expected Discharge Date: Expected Discharge Date: 04/12/16  Team Members Present: Physician leading conference: Dr. Delice Lesch Social Worker Present: Ovidio Kin, LCSW Nurse Present: Heather Roberts, RN PT Present: Barrie Folk, PT;Victoria Sabra Heck, PT OT Present: Willeen Cass, OT PPS Coordinator present : Daiva Nakayama, RN, CRRN     Current Status/Progress Goal Weekly Team Focus  Medical   Functional and mobility deficits secondary to lumbar spondlylolisthesis and radiculopathy and obesity  Improve neuropathic and nociceptive pain, mobility, transfers  See above   Bowel/Bladder   continent of bowel &bladder. LBM 04/05/16  remain continent of bowel and bladder with min assist   Offer toileting q 3 hours and prn    Swallow/Nutrition/ Hydration             ADL's   Supervision for UB selfcare including lumbar corsett.  Min assist for LB dressing sit to stand with use of AE as well.  Min assist  for toilet and tub shower transfers.    modified independent overall  selfcare re-training, transfer training, balance re-training, DME education, AE education, pt/family education   Mobility   maximum 100 ft with RW & steady A; pt significantly limited by pain with all mobility, negotiates 4 steps with B rails & min A, tolerates standing 3 minutes 45 seconds, range from steady A<>mod/max A for sit<>stand transfers  Mod I for transfers & ambulation with LRAD, Mod I standing balance & bed mobility, negotiate 2 steps without rails & LRAD with Mod I  pain management, gait training, BLE strengthening, stair negotiation, improving activity tolerance   Communication             Safety/Cognition/ Behavioral Observations            Pain   C/o pain to lower extremities. Robaxin 750 mg scheduled. Oxycontin 10 mg scheduled. Tramadol 50 mg scheduled. Oxy 1r 5-10mg  q 4 prn   <4   Assess pain q4 hours and prn. Tx accordingly   Skin   Incision to lower back; skin glue and OTA   no new skin breakdown/infection   Encourage pressure relief. Assess skin q shift and prn       *See Care Plan and progress notes for long and short-term goals.  Barriers to Discharge: Nociceptive and Neuropathic pain, constipation, ABLA    Possible Resolutions to Barriers:  Follow labs, bowel meds adjusted, optimize pain meds  Discharge Planning/Teaching Needs:  Home with sister coming to 88 wiht for 1-2 weeks for the transition home      Team Discussion:  Goals mod/i level pain is her main issue and this limits her in therapies. RN working on constipation. Most pain is when pt goes from sit to stand. MD increased pain meds. Will do family education with sister when arrives day of discharge.  Revisions to Treatment Plan:  None   Continued Need for Acute Rehabilitation Level of Care: The patient requires daily medical management by a physician with specialized training in physical medicine and rehabilitation for the  following conditions: Daily direction of a multidisciplinary physical rehabilitation program to ensure safe treatment while eliciting the highest outcome that is of practical value to the patient.: Yes Daily medical management of patient stability for increased activity during participation in an intensive rehabilitation regime.: Yes Daily analysis of laboratory values and/or radiology reports with any subsequent need for medication adjustment of medical intervention for : Post surgical problems;Neurological problems;Other  [name] 04/06/2016, 8:49 AM

## 2016-04-04 NOTE — Progress Notes (Signed)
Social Work Patient ID: Sheri Baker, female   DOB: [birth year], 65 y.o.   MRN: [id number] Met with pt to inform team conference goals-mod/i level and discharge 6/22. Pt will contact sister to let her know to allow her to plan her journey here. Her last day of school is 6/21, so education will need to be day of discharge for pt. She will let this worker know when sister will be here so can schedule family education. Pt reports her pain is much better today and she is able to do more in therapies. Will continue to work on discharge plans.

## 2016-04-04 NOTE — Progress Notes (Signed)
Patient rested off and on throughout the night. Patient complains of uncontrolled pain in back, radiating toward legs. Patient able to ambulate with walker and assistance, very slow movements noted. Continent of bladder. Tolerates po medications without difficulty. Given prn dose of Miralax with pm meds, as bowel movement was reported to be very hard in consistency. No acute distress.

## 2016-04-04 NOTE — Progress Notes (Signed)
Occupational Therapy Session Note  Patient Details  Name: Sheri Baker MRN: [id number] Date of Birth: 12-25-1950  Today's Date: 04/04/2016 OT Individual Time: 1300-1330 OT Individual Time Calculation (min): 30 min    Short Term Goals: Week 1:  OT Short Term Goal 1 (Week 1): Bathe seated on tub bench using AE prn with min assist for thoroughness OT Short Term Goal 2 (Week 1): Dress lower body, sitting and standing using AE prn to maintian back precautions OT Short Term Goal 3 (Week 1): Complete light meal prep using LRAD and AE prn with instructional cues OT Short Term Goal 4 (Week 1): Maintain back precautions 100% of the time during performance of BADL for 1 hour with min vc  Skilled Therapeutic Interventions/Progress Updates:    1:1 Therapeutic activity with focus on sit to stands, standing balance without UE support, core and LE strengthening etc. Pt able to perform stand pivot transfers with minA with RW. Transitioned down to the gym and focused on sit to stands with UE support, static standing without UE support and then transitioning to toe tapping on block step without UE support. Pt then able to progress stepping up onto block with min A with RW for UE support. Pt reports increased pain with transitional movements but decreases with sitting or standing position.  Therapy Documentation Precautions:  Precautions Precautions: Back Precaution Booklet Issued: Yes (comment) Precaution Comments: Pt recalled 3 of 3 (no lifting vs on arching) Required Braces or Orthoses: Spinal Brace Spinal Brace: Lumbar corset, Applied in sitting position Restrictions Weight Bearing Restrictions: No Pain: Pain Assessment Pain Assessment: No/denies pain Pain Score: 0-No pain ADL: ADL ADL Comments: see Assessment Tool  See Function Navigator for Current Functional Status.   Therapy/Group: Individual Therapy  Willeen Cass [facility] 04/04/2016, 3:07 PM

## 2016-04-05 ENCOUNTER — Inpatient Hospital Stay (HOSPITAL_COMMUNITY): Payer: BLUE CROSS/BLUE SHIELD | Admitting: Physical Therapy

## 2016-04-05 ENCOUNTER — Inpatient Hospital Stay (HOSPITAL_COMMUNITY): Payer: BLUE CROSS/BLUE SHIELD | Admitting: Occupational Therapy

## 2016-04-05 LAB — GLUCOSE, CAPILLARY
Glucose-Capillary: 100 mg/dL — ABNORMAL HIGH (ref 65–99)
Glucose-Capillary: 100 mg/dL — ABNORMAL HIGH (ref 65–99)
Glucose-Capillary: 96 mg/dL (ref 65–99)
Glucose-Capillary: 124 mg/dL — ABNORMAL HIGH (ref 65–99)

## 2016-04-05 NOTE — Progress Notes (Signed)
Physical Therapy Session Note  Patient Details  Name: Sheri Baker MRN: [id number] Date of Birth: 03-Jul-1951  Today's Date: 04/05/2016 PT Individual Time: 1105-1201 PT Individual Time Calculation (min): 56 min   Short Term Goals: Week 1:  PT Short Term Goal 1 (Week 1): STG=LTG due to ELOS  Skilled Therapeutic Interventions/Progress Updates:    Patient received sitting in [facility] stating that she had just finished Gait training with PTA and had some dizziness. PT performed orthostatic BP assessment. Sitting: 124/72. Standing for 2 minutes. 124/98. Asymptomatic.   Gait training for 143ft, 124ft, 112ft and 19ft with RW and min-supervision A from PT and min cues for proper UE placement on RW and to keep RW closer to COM. Stair training 2 bout. x 4 steps. 1st bout with BUE support on Bilat rails. Second bout with BUE on L rail ascending. Min A from PT and mod cues for proper technique with BUE support on 1 rail.  Dynamic balance training to step on target with weight shift over front foot without AD. Min A from PT and continued encouragement for successful weight shifting.   Throughout treatment sit<>stand x 10 with min A from PT.   Patient left sitting in [facility] with call bell within reach.    Therapy Documentation Precautions:  Precautions Precautions: Back Precaution Booklet Issued: Yes (comment) Precaution Comments: Able to recall 3/3 Required Braces or Orthoses: Spinal Brace Spinal Brace: Lumbar corset Restrictions Weight Bearing Restrictions: No   See Function Navigator for Current Functional Status.   Therapy/Group: Individual Therapy  [name] 04/05/2016, 12:57 PM

## 2016-04-05 NOTE — Progress Notes (Signed)
Occupational Therapy Session Note  Patient Details  Name: Sheri Baker MRN: [id number] Date of Birth: 1951-08-30  Today's Date: 04/05/2016 OT Individual Time: 0900- 1000 Individual Treatment Time 60 minutes   Short Term Goals: Week 1:  OT Short Term Goal 1 (Week 1): Bathe seated on tub bench using AE prn with min assist for thoroughness OT Short Term Goal 2 (Week 1): Dress lower body, sitting and standing using AE prn to maintian back precautions OT Short Term Goal 3 (Week 1): Complete light meal prep using LRAD and AE prn with instructional cues OT Short Term Goal 4 (Week 1): Maintain back precautions 100% of the time during performance of BADL for 1 hour with min vc :   :     Skilled Therapeutic Interventions/Progress Updates:    Pt participated in self care mgt tx focusing on improving functional independence and safety for toileting. Pt provided ed on options for use of toilet aide for improving thoroughness with pericare. Pt trialed AE in simulated environment standing prn sinkside x2 trials. Pt ed provided on adaptive tech for B LE placement in order to adhere to back precautions with good carryover demonstration. Toilet aide placed in pt bathroom with instruction on use with verbalized understanding. To further improve pt toileting abilities, BSC was addressed during tx. Pt originally had BSC removed due to discomfort and B foot dangle during toileting. BSC modified with pt completing toilet transfer verbalizing increased comfort. Improved sit to stand noted during transfer with RTS with min cuing for proper hand placement. Pt reported improved pain rating of 4/10 today with recovery periods provided during session with flareups of pain. LB dressing of pants and socks completed with use of AE and S for min instruction on tech and attention to task. To complete session, pt participated in B UE stretches x10 reps/second holds and B UE there ex with use of orange tband x10 reps with instruction  on proper tech and breathing coordination to increase strength for functional transfers. Pt continues to require Min A for all functional transfers. Back precaution recall 3/3. Pt left in w/c in room with call bell and nursing phone within reach.      Therapy Documentation Precautions:  Precautions Precautions: Back Precaution Booklet Issued: Yes (comment) Precaution Comments: Able to recall 3/3 Required Braces or Orthoses: Spinal Brace Spinal Brace: Lumbar corset Restrictions Weight Bearing Restrictions: No General:   Vital Signs: Therapy Vitals Temp: 98.2 F (36.8 C) Temp Source: Oral Pulse Rate: 98 Resp: 18 BP: 120/67 mmHg Patient Position (if appropriate): Sitting Oxygen Therapy SpO2: 98 % O2 Device: Not Delivered Pain:  4/10 with pt reporting improvement in pain since last tx session  Exercises:   isolated bicep, deltoid, and tricep exercises with orange tband x10 reps      See Function Navigator for Current Functional Status.   Therapy/Group: Individual Therapy  Michaela A Hoffman 04/05/2016, 1:43 PM

## 2016-04-05 NOTE — Progress Notes (Signed)
Physical Therapy Session Note  Patient Details  Name: Sheri Baker MRN: [id number] Date of Birth: 1951/04/29  Today's Date: 04/05/2016 PT Individual Time: 1035-1105 PT Individual Time Calculation (min): 30 min   Short Term Goals: Week 2:     Skilled Therapeutic Interventions/Progress Updates:    Pt presented in chair agreeable to therapy.  Pt ambulated approx 165f x 1 with PTA providing x 1 verbal cue for improving erect posture. Pt requesting seated rest 2/2 c/o dizziness BP 136/78.  After seated rest pt stating feeling better and to continue therapy.  PTA reviewed pt for sequencing for car transfer and pt able to perform with close S.  Pt able to elevate both extremities into car without assist.  Pt ambulated 1235f1 with RW and CGA with  PTA providing cues for increasing gait speed which pt was able to perform and maintain good foot clearance. Pt returned to room and met by AuLiane ComberT to continue therapy sessions.   Therapy Documentation Precautions:  Precautions Precautions: Back Precaution Booklet Issued: Yes (comment) Precaution Comments: Able to recall 3/3 Required Braces or Orthoses: Spinal Brace Spinal Brace: Lumbar corset Restrictions Weight Bearing Restrictions: No General:   Vital Signs: Therapy Vitals Temp: 98.2 F (36.8 C) Temp Source: Oral Pulse Rate: 98 Resp: 18 BP: 120/67 mmHg Patient Position (if appropriate): Sitting Oxygen Therapy SpO2: 98 % O2 Device: Not Delivered Pain: Pain Assessment Pain Assessment: 0-10 Pain Score: 2  Pain Type: Acute pain Pain Location: Back Pain Orientation: Mid Pain Intervention(s): Medication (See eMAR) Mobility:   Locomotion :    Trunk/Postural Assessment :    Balance:   Exercises:   Other Treatments:     See Function Navigator for Current Functional Status.   Therapy/Group: Individual Therapy  Rosita DeChalus  Rosita DeChalus, PTA  04/05/2016, 2:55 PM

## 2016-04-05 NOTE — Progress Notes (Signed)
Occupational Therapy Session Note  Patient Details  Name: Sheri Baker MRN: [id number] Date of Birth: 06-Jun-1951  Today's Date: 04/05/2016 OT Individual Time: 1447-1530 OT Individual Time Calculation (min): 43 min    Short Term Goals: Week 1:  OT Short Term Goal 1 (Week 1): Bathe seated on tub bench using AE prn with min assist for thoroughness OT Short Term Goal 2 (Week 1): Dress lower body, sitting and standing using AE prn to maintian back precautions OT Short Term Goal 3 (Week 1): Complete light meal prep using LRAD and AE prn with instructional cues OT Short Term Goal 4 (Week 1): Maintain back precautions 100% of the time during performance of BADL for 1 hour with min vc  Skilled Therapeutic Interventions/Progress Updates:    Upon entering the room, pt seated in recliner chair with family present in the room. Family leaving after therapist arrival and pt reports 6/10 pain in back and called RN for medications this session. Pt utilized long handled reacher to open bag on floor to obtain clothing items. Pt placed clothing items on RW and ambulated to dresser to place items in drawer with steady assist. Pt having to squat in order to open dresser with steady assist. Pt returned to recliner chair at end of session. She was able to recall 3/3 back precautions accurately. Pt seated in recliner chair with call bell and all needed items within reach.   Therapy Documentation Precautions:  Precautions Precautions: Back Precaution Booklet Issued: Yes (comment) Precaution Comments: Able to recall 3/3 Required Braces or Orthoses: Spinal Brace Spinal Brace: Lumbar corset Restrictions Weight Bearing Restrictions: No General:   Vital Signs: Therapy Vitals Temp: 98.2 F (36.8 C) Temp Source: Oral Pulse Rate: 98 Resp: 18 BP: 120/67 mmHg Patient Position (if appropriate): Sitting Oxygen Therapy SpO2: 98 % O2 Device: Not Delivered Pain: Pain Assessment Pain Assessment: 0-10 Pain Score:  7  Pain Type: Acute pain Pain Location: Back Pain Orientation: Mid Pain Intervention(s): Medication (See eMAR) ADL: ADL ADL Comments: see Assessment Tool  See Function Navigator for Current Functional Status.   Therapy/Group: Individual Therapy  Phineas Semen 04/05/2016, 4:05 PM

## 2016-04-05 NOTE — Progress Notes (Signed)
Sheri Baker PHYSICAL MEDICINE & REHABILITATION     PROGRESS NOTE    Subjective/Complaints: Patient sitting in her chair at the sink today washing up. She appears to have very complaints of pain, but denies pain to be this morning. Overall, she slept well overnight.  ROS: Denies CP, SOB, N/V/D.   Objective: Vital Signs: Blood pressure 106/66, pulse 100, temperature 98.9 F (37.2 C), temperature source Oral, resp. rate 20, height 5\' 5"  (1.651 m), weight 130.817 kg (288 lb 6.4 oz), SpO2 97 %. No results found.  Recent Labs  04/03/16 0353 04/04/16 0556  WBC 10.6* 8.9  HGB 10.0* 9.9*  HCT 30.9* 31.0*  PLT 315 233    Recent Labs  04/03/16 0353 04/04/16 0556  NA 134* 135  K 3.6 3.7  CL 98* 98*  GLUCOSE 107* 102*  BUN 10 8  CREATININE 0.85 0.84  CALCIUM 8.9 8.8*   CBG (last 3)   Recent Labs  04/04/16 1624 04/04/16 2033 04/05/16 0633  GLUCAP 87 111* 100*    Wt Readings from Last 3 Encounters:  04/04/16 130.817 kg (288 lb 6.4 oz)  03/27/16 138.71 kg (305 lb 12.8 oz)  03/20/16 129 kg (284 lb 6.3 oz)    Physical Exam:  Constitutional: She appears well-developed and well-nourished. No distress. Morbidly obese  HENT: Normocephalic and atraumatic.  Eyes: Conjunctivae and Conj are normal.   Cardiovascular: Normal rate and regular rhythm. No murmur heard. Respiratory: Effort normal. No stridor. She has no wheezes.  GI: Soft. Bowel sounds are normal. There is no tenderness.  Musculoskeletal: Denies tenderness. She exhibits edema. Trace to 1+ pretibial edema  Neurological: She is alert and oriented.  B/l UE strength 4+/5.  RLE: 4-/5 proximally, 4+/5 distally LLE: 4/5 proximally, 4+/5 distally Skin: Skin is warm and dry. She is not diaphoretic.  Lower back incision c/d/i. Psychiatric: Her speech is normal. Judgment and thought content normal. Less anxious. Cognition and memory are normal.   Assessment/Plan: 1. Functional and mobility deficits secondary to lumbar  spondylolisthesis/radiculopathy which require 3+ hours per day of interdisciplinary therapy in a comprehensive inpatient rehab setting. Physiatrist is providing close team supervision and 24 hour management of active medical problems listed below. Physiatrist and rehab team continue to assess barriers to discharge/monitor patient progress toward functional and medical goals.  Function:  Bathing Bathing position   Position: Shower  Bathing parts Body parts bathed by patient: Left arm, Right arm, Chest, Abdomen, Front perineal area, Right upper leg, Left upper leg, Right lower leg, Left lower leg, Back Body parts bathed by helper: Buttocks  Bathing assist Assist Level: Touching or steadying assistance(Pt > 75%)      Upper Body Dressing/Undressing Upper body dressing   What is the patient wearing?: Orthosis     Pull over shirt/dress - Perfomed by patient: Thread/unthread right sleeve, Thread/unthread left sleeve, Put head through opening, Pull shirt over trunk       Orthosis activity level: Performed by patient  Upper body assist Assist Level: Set up   Set up : To obtain clothing/put away, To apply TLSO, cervical collar  Lower Body Dressing/Undressing Lower body dressing   What is the patient wearing?: Socks, [employer], Pants     Pants- Performed by patient: Thread/unthread right pants leg, Thread/unthread left pants leg, Pull pants up/down Pants- Performed by helper: Pull pants up/down Non-skid slipper socks- Performed by patient: Don/doff right sock, Don/doff left sock Non-skid slipper socks- Performed by helper: Don/doff right sock, Don/doff left sock (secondary to  time constraints)               TED Hose - Performed by helper: Don/doff right TED hose, Don/doff left TED hose (secondary to time constraints)  Lower body assist Assist for lower body dressing: Touching or steadying assistance (Pt > 75%)      Toileting Toileting   Toileting steps completed by patient:  Adjust clothing prior to toileting, Performs perineal hygiene, Adjust clothing after toileting Toileting steps completed by helper: Adjust clothing prior to toileting, Adjust clothing after toileting Toileting Assistive Devices: Grab bar or rail  Toileting assist Assist level: More than reasonable time   Transfers Chair/bed transfer   Chair/bed transfer method: Ambulatory Chair/bed transfer assist level: Touching or steadying assistance (Pt > 75%) Chair/bed transfer assistive device: Armrests, Bedrails     Locomotion Ambulation     Max distance: 150 ft Assist level: Touching or steadying assistance (Pt > 75%)   Wheelchair   Type: Manual Max wheelchair distance: 100 ft Assist Level: Supervision or verbal cues  Cognition Comprehension Comprehension assist level: Follows basic conversation/direction with no assist  Expression Expression assist level: Expresses basic needs/ideas: With no assist  Social Interaction Social Interaction assist level: Interacts appropriately 90% of the time - Needs monitoring or encouragement for participation or interaction.  Problem Solving Problem solving assist level: Solves basic problems with no assist  Memory Memory assist level: Recognizes or recalls 75 - 89% of the time/requires cueing 10 - 24% of the time   Medical Problem List and Plan: 1. Functional and mobility deficits secondary to lumbar spondlylolisthesis and radiculopathy  -Cont CIR 2. DVT Prophylaxis/Anticoagulation: Mechanical: Sequential compression devices, below knee Bilateral lower extremities  3. Pain Management: Changed hydrocodone (causes palpitations) to oxycodone prn. Scheduled tramadol for better pain control.   -robaxin helpful for pain--- changed to 750mg  qid scheduled  -utilize ice/heat also  -Gabapentin increased to 600 3 times a day at 6/13, with improvement in symptoms   -OxyContin 10 mg twice a day started on 614, will wean prior to discharge 4. Mood: Team to  provide ego support. LCSW to follow for evaluation and support.  5. Neuropsych: This patient is capable of making decisions on her own behalf. 6. Skin/Wound Care: Monitor wound daily for healing. Maintain adequate nutritional and hydration status.  7. Fluids/Electrolytes/Nutrition: Cont I/Os  Mild hyponatremia: 135 on 6/14.   8. T2DM--diet controlled: tight control at present  9. HTN: Monitor BP. On aldactone/HCTZ.  Cozaar--continue to hold. Encourage fluid intake.  10 ABLA:  Added iron supplement.   Hemoglobin 9.9 on 6/14 (stable) 11. Leucocytosis: Resolved  No fevers or other signs of infection.   WBCs 8.9 on 6/14  Will continue to monitor 12. Constipation:  Increased bowel meds on 6/12, with results  LOS (Days) 6 A FACE TO FACE EVALUATION WAS PERFORMED  Sheri Baker 04/05/2016 9:09 AM

## 2016-04-05 NOTE — Progress Notes (Signed)
Patient complains of feeling cold more frequently than normal and waking up covered in sweat. Afebrile. Has not had thyroid level checked in "quite some time." Will alert MD on rounds. Patient pain seems to be managed well after rx change implemented yesterday. Patient pleased.

## 2016-04-05 NOTE — Progress Notes (Signed)
Social Work Patient ID: Sheri Baker, female   DOB: [birth year], 65 y.o.   MRN: [id number] Attempted to provide a clinical update to pt's [employer] for continued stay. Have left a message for her to return my call.

## 2016-04-05 NOTE — Progress Notes (Signed)
Physical Therapy Session Note  Patient Details  Name: Sheri Baker MRN: [id number] Date of Birth: June 20, 1951  Today's Date: 04/05/2016 PT Individual Time: 5361-4431 PT Individual Time Calculation (min): 35 min   Short Term Goals: Week 1:  PT Short Term Goal 1 (Week 1): STG=LTG due to ELOS  Skilled Therapeutic Interventions/Progress Updates:    Pt received resting in w/c with c/o 7/10 pain but reports she has already had pain medication, and is agreeable to therapy session.   Pt transfers sit<>stand from a variety of surfaces throughout session with RW and supervision.  Stand/pivot and ambulatory transfers with close supervision/steady assist.    Gait training to therapy gym with heavy duty RW and close supervision with intermittent steady assist.  Pt reports walker is heavy and noisy, PT exchanged for non-heavy duty walker.    PT instructed pt in LE therex 2x10 reps of LAQ and hip flexion in sitting, with pt education provided for use of chair with back support to decrease demand on core musculature initially, then progress to sitting on edge of chair for increased challenge.  Pt verbalized understanding.    Pt returned to room in w/c and ambulated back to bed with RW.  Pt doffed clothes and brace and donned hospital gown sitting EOB and transitioned to supine with supervision.  Positioned to comfort, call bell in reach and needs met.   Therapy Documentation Precautions:  Precautions Precautions: Back Precaution Booklet Issued: Yes (comment) Precaution Comments: Able to recall 3/3 Required Braces or Orthoses: Spinal Brace Spinal Brace: Lumbar corset Restrictions Weight Bearing Restrictions: No   See Function Navigator for Current Functional Status.   Therapy/Group: Individual Therapy  Earnest Conroy Penven-Crew 04/05/2016, 4:35 PM

## 2016-04-06 ENCOUNTER — Inpatient Hospital Stay (HOSPITAL_COMMUNITY): Payer: BLUE CROSS/BLUE SHIELD | Admitting: Occupational Therapy

## 2016-04-06 ENCOUNTER — Inpatient Hospital Stay (HOSPITAL_COMMUNITY): Payer: BLUE CROSS/BLUE SHIELD | Admitting: Physical Therapy

## 2016-04-06 ENCOUNTER — Encounter (HOSPITAL_COMMUNITY): Payer: Self-pay | Admitting: Neurological Surgery

## 2016-04-06 LAB — GLUCOSE, CAPILLARY
Glucose-Capillary: 105 mg/dL — ABNORMAL HIGH (ref 65–99)
Glucose-Capillary: 92 mg/dL (ref 65–99)
Glucose-Capillary: 152 mg/dL — ABNORMAL HIGH (ref 65–99)
Glucose-Capillary: 116 mg/dL — ABNORMAL HIGH (ref 65–99)

## 2016-04-06 NOTE — Progress Notes (Signed)
Occupational Therapy Session Note  Patient Details  Name: Sheri Baker MRN: [id number] Date of Birth: 17-Sep-1951  Today's Date: 04/06/2016 OT Individual Time: [id number] OT Individual Time Calculation (min): 60 min    Short Term Goals: Week 1:  OT Short Term Goal 1 (Week 1): Bathe seated on tub bench using AE prn with min assist for thoroughness OT Short Term Goal 2 (Week 1): Dress lower body, sitting and standing using AE prn to maintian back precautions OT Short Term Goal 3 (Week 1): Complete light meal prep using LRAD and AE prn with instructional cues OT Short Term Goal 4 (Week 1): Maintain back precautions 100% of the time during performance of BADL for 1 hour with min vc  Skilled Therapeutic Interventions/Progress Updates:    Pt seen in AM for self care mgt retraining and education on use of AE for further decreasing BOC with ADLs. Pt completed UB/LB ADLs in shower room today on shower bench with setup. Pt able to lift bilateral LEs over lip of tub, UB/LB bathing completed with use of AE and S for adherence to back precautions. Pt completed pericare with use of toilet aide for thoroughness with instruction on use and good carryover demonstration. UB dressing including back orthosis and bra completed with setup. Pt able to don back orthosis correctly without physical A. LB dressing completed at edge of tub bench standing prn with RW and utilizing AE with steadying assistance to hike LB garments. Recall of back precautions 3/3. Pt completed oral care and grooming tasks w/c level at sink with setup. Pt returned to bed at end of session per request with call bell and nursing phone within reach. Pt maintained back precautions 100% of time during ADL today for 60 minutes today. Pt continues to benefit from balance retraining as well as IADL retraining with education on adaptive tech for safe return to meaningful roles post d/c while adhering to back precautions.   Therapy  Documentation Precautions:  Precautions Precautions: Back Precaution Booklet Issued: Yes (comment) Precaution Comments: Able to recall 3/3 Required Braces or Orthoses: Spinal Brace Spinal Brace: Lumbar corset Restrictions Weight Bearing Restrictions: No General:   Vital Signs: Therapy Vitals Temp: 98.7 F (37.1 C) Temp Source: Oral Pulse Rate: (!) 116 Resp: 16 BP: 136/86 mmHg Patient Position (if appropriate): Sitting Oxygen Therapy SpO2: 98 % O2 Device: Not Delivered Pain: Pain Assessment Pain Score: 6 / nursing provided pain meds prior to tx        See Function Navigator for Current Functional Status.   Therapy/Group: Individual Therapy  Michaela A Hoffman 04/06/2016, 3:40 PM

## 2016-04-06 NOTE — Progress Notes (Signed)
Physical Therapy Session Note  Patient Details  Name: Sheri Baker MRN: [id number] Date of Birth: [birth year]  Today's Date: 04/06/2016 PT Individual Time: 0800-0900 AND 1331-1500 PT Individual Time Calculation (min): 60 min AND 89 min   Short Term Goals: Week 1:  PT Short Term Goal 1 (Week 1): STG=LTG due to ELOS  Skilled Therapeutic Interventions/Progress Updates:  Session 1    patient received sitting EOB and agreable to PT. Stand pivot transfer to Sheri Baker with min A from PT without AD from bed.  Personal hygiene sitting in WC and was able to maintain neutral postural alignment for while bathing face back.  Dressing with min-mod A for underpants and pants to thread BLE. Patient able to pull to waist in standing position following sit<>stand transfers with min A from PT without back brace.   PT assisted patient to don Back brace with min A in standing position following bathing and dressing tasks.   Gait training to rehab gym for 214ft x 2 with supervision A with 1 standing rest break to talk to MD.   PT instructede patient in Sheri Baker Exercise level A. PT provided close supervision A For all standing exercises. As well as min-mod verbal and visual cues for improved technique, increased ROM, improved control of eccentric movement.   Session 2.  Patient received sitting in WC wearing back brace and agreeable to PT.   PT instructed patient in Gait training in simulated community environment with RW for 250ft in gift shop with supervision A from PT with min cues for AD management. She was able to remain standing for 15 minutes while looking at objects around the store. Improved dynamic stability noted with patient ability to reach for objects around store beyond BOS x 10 during commuity gait training.  Patient performedGait training on uneven surface including cement sidewalk for 121ft and 50 ft with supervision A from PT with min cues for improved step length bilaterally and AD management over  cracks in side walk.   Gait training instructed by PT on carpet for 121ft, and 125 ft with RW and supervision A from PT, as well as min cues for improved step length and weight shift to allow more normalized gait patterns.  PT instructed patient in Gait training in hall on tile floor for 228ft with RW and supervision A for navigation to new room in rehab.   Transfer training to various seat heights x 15 throughout treatment with supervision A and use of RW. PT provided mod verbal and visual cues for improved UE positioning to increase safety of transfer as well as reduce stress on low back. Patient was able to demonstrate proper UE positioning and weight shifting 75% of the time.   Patient instructed in standing therex.  Foot taps on 6 inch cone with finger tip support progressing to no UE support x 12 BLE.  Toe taps on 6 inch cone with BUE finger tip support x 10 BLE  Hip abduction to 6 inch step with weight shift over ipsilateral LE x 10 BLE  Throughout standing therex, PT provided min A and mod verbal and visual cues for improved exercises technique and increased core and pelvic stability to prevent rotation on the low back .   Patient left sitting in recliner with NT present upon completion of PT.        Therapy Documentation Precautions:  Precautions Precautions: Back Precaution Booklet Issued: Yes (comment) Precaution Comments: Able to recall 3/3 Required Braces or Orthoses: Spinal Brace Spinal Brace:  Lumbar corset Restrictions Weight Bearing Restrictions: No    Pain: Pain Assessment Pain Score: 5 Pain Type: Acute pain Pain Location: Back Pain Orientation: Mid Pain Intervention(s): Medication (See eMAR)  See Function Navigator for Current Functional Status.   Therapy/Group: Individual Therapy  [name] 04/06/2016, 10:02 AM

## 2016-04-06 NOTE — Progress Notes (Signed)
[place] PHYSICAL MEDICINE & REHABILITATION     PROGRESS NOTE    Subjective/Complaints: Slept ok. Sore from working hard in therapy. Overall happy with progress. Low back is only area of complaint today  ROS: Denies CP, SOB, N/V/D.   Objective: Vital Signs: Blood pressure 126/82, pulse 92, temperature 98.7 F (37.1 C), temperature source Oral, resp. rate 18, height 5\' 5"  (1.651 m), weight 130.817 kg (288 lb 6.4 oz), SpO2 96 %. No results found.  Recent Labs  04/04/16 0556  WBC 8.9  HGB 9.9*  HCT 31.0*  PLT 233    Recent Labs  04/04/16 0556  NA 135  K 3.7  CL 98*  GLUCOSE 102*  BUN 8  CREATININE 0.84  CALCIUM 8.8*   CBG (last 3)   Recent Labs  04/05/16 1654 04/05/16 2053 04/06/16 0655  GLUCAP 96 124* 105*    Wt Readings from Last 3 Encounters:  04/04/16 130.817 kg (288 lb 6.4 oz)  03/27/16 138.71 kg (305 lb 12.8 oz)  03/20/16 129 kg (284 lb 6.3 oz)    Physical Exam:  Constitutional: She appears well-developed and well-nourished. No distress. Morbidly obese  HENT: Normocephalic and atraumatic.  Eyes: Conjunctivae and Conj are normal.   Cardiovascular: Normal rate and regular rhythm. No murmur heard. Respiratory: Effort normal. No stridor. She has no wheezes.  GI: Soft. Bowel sounds are normal. There is no tenderness.  Musculoskeletal: Denies tenderness. She exhibits edema. Trace to 1+ pretibial edema  Neurological: She is alert and oriented.  B/l UE strength 4+/5.  RLE: 4-/5 proximally, 4+/5 distally LLE: 4/5 proximally, 4+/5 distally Skin: Skin is warm and dry. She is not diaphoretic.  Lower back incision c/d/i. Psychiatric: Her speech is normal. Judgment and thought content normal. Less anxious. Cognition and memory are normal.   Assessment/Plan: 1. Functional and mobility deficits secondary to lumbar spondylolisthesis/radiculopathy which require 3+ hours per day of interdisciplinary therapy in a comprehensive inpatient rehab  setting. Physiatrist is providing close team supervision and 24 hour management of active medical problems listed below. Physiatrist and rehab team continue to assess barriers to discharge/monitor patient progress toward functional and medical goals.  Function:  Bathing Bathing position   Position: Shower  Bathing parts Body parts bathed by patient: Left arm, Right arm, Chest, Abdomen, Front perineal area, Right upper leg, Left upper leg, Right lower leg, Left lower leg, Back Body parts bathed by helper: Buttocks  Bathing assist Assist Level: Touching or steadying assistance(Pt > 75%)      Upper Body Dressing/Undressing Upper body dressing   What is the patient wearing?: Orthosis     Pull over shirt/dress - Perfomed by patient: Thread/unthread right sleeve, Thread/unthread left sleeve, Put head through opening, Pull shirt over trunk       Orthosis activity level: Performed by patient  Upper body assist Assist Level: Set up   Set up : To obtain clothing/put away, To apply TLSO, cervical collar  Lower Body Dressing/Undressing Lower body dressing   What is the patient wearing?: Pants     Pants- Performed by patient: Thread/unthread right pants leg, Thread/unthread left pants leg, Pull pants up/down Pants- Performed by helper: Pull pants up/down Non-skid slipper socks- Performed by patient: Don/doff right sock, Don/doff left sock Non-skid slipper socks- Performed by helper: Don/doff right sock, Don/doff left sock (secondary to time constraints)               TED Hose - Performed by helper: Don/doff right TED hose, Don/doff left TED  hose (secondary to time constraints)  Lower body assist Assist for lower body dressing: Supervision or verbal cues      Toileting Toileting   Toileting steps completed by patient: Adjust clothing prior to toileting, Performs perineal hygiene, Adjust clothing after toileting Toileting steps completed by helper: Adjust clothing prior to  toileting, Adjust clothing after toileting Toileting Assistive Devices: Grab bar or rail, Other (comment) (Pt RTS modified for ease of use in bathroom)  Toileting assist Assist level: Touching or steadying assistance (Pt.75%)   Transfers Chair/bed transfer   Chair/bed transfer method: Stand pivot Chair/bed transfer assist level: Touching or steadying assistance (Pt > 75%) Chair/bed transfer assistive device: Armrests, Medical sales representative     Max distance: 160ft Assist level: Supervision or verbal cues   Wheelchair   Type: Manual Max wheelchair distance: 100 ft Assist Level: Supervision or verbal cues  Cognition Comprehension Comprehension assist level: Follows basic conversation/direction with no assist  Expression Expression assist level: Expresses basic needs/ideas: With no assist  Social Interaction Social Interaction assist level: Interacts appropriately 90% of the time - Needs monitoring or encouragement for participation or interaction.  Problem Solving Problem solving assist level: Solves basic problems with no assist  Memory Memory assist level: Recognizes or recalls 90% of the time/requires cueing < 10% of the time   Medical Problem List and Plan: 1. Functional and mobility deficits secondary to lumbar spondlylolisthesis and radiculopathy  -Cont CIR--making functional gains 2. DVT Prophylaxis/Anticoagulation: Mechanical: Sequential compression devices, below knee Bilateral lower extremities  3. Pain Management: Changed hydrocodone (causes palpitations) to oxycodone prn. Scheduled tramadol for better pain control.   -robaxin helpful for pain---  750mg  qid scheduled  -utilizing ice/heat also  -Gabapentin increased to 600 3 times a day at 6/13, with improvement in symptoms   -OxyContin 10 mg twice a day started on 6/14---wean prior to discharge 4. Mood: Team to provide ego support. LCSW to follow for evaluation and support.  5. Neuropsych: This patient  is capable of making decisions on her own behalf. 6. Skin/Wound Care: Monitor wound daily for healing. Maintain adequate nutritional and hydration status.  7. Fluids/Electrolytes/Nutrition: Cont I/Os  Mild hyponatremia: 135 on 6/14.   8. T2DM--diet controlled: tight control at present  9. HTN: Monitor BP. On aldactone/HCTZ.  Cozaar--continue to hold. Encourage fluid intake.  10 ABLA:  Added iron supplement.   Hemoglobin 9.9 on 6/14 (stable) 11. Leucocytosis: Resolved  No fevers or other signs of infection.   WBCs 8.9 on 6/14  Will continue to monitor 12. Constipation:  Increased bowel meds on 6/12, with results  LOS (Days) 7 A FACE TO FACE EVALUATION WAS PERFORMED  SWARTZ,ZACHARY T 04/06/2016 9:49 AM

## 2016-04-06 NOTE — Progress Notes (Signed)
Social Work Patient ID: Sheri Baker, female   DOB: 1950/12/19, 65 y.o.   MRN: [id number] [employer] contact worker to inform will need more information regarding pt's functional level. Have left a message for Pam and left her functional goals and current level of her voicemail as instructed to do. She is off today but will touch base with her on Monday. Pt's sister planning to come on Thursday for education prior to discharge home. Will work on discharge needs, equipment and follow up.

## 2016-04-07 ENCOUNTER — Inpatient Hospital Stay (HOSPITAL_COMMUNITY): Payer: BLUE CROSS/BLUE SHIELD | Admitting: Physical Therapy

## 2016-04-07 ENCOUNTER — Inpatient Hospital Stay (HOSPITAL_COMMUNITY): Payer: BLUE CROSS/BLUE SHIELD

## 2016-04-07 LAB — GLUCOSE, CAPILLARY
Glucose-Capillary: 107 mg/dL — ABNORMAL HIGH (ref 65–99)
Glucose-Capillary: 103 mg/dL — ABNORMAL HIGH (ref 65–99)
Glucose-Capillary: 104 mg/dL — ABNORMAL HIGH (ref 65–99)
Glucose-Capillary: 132 mg/dL — ABNORMAL HIGH (ref 65–99)

## 2016-04-07 NOTE — Progress Notes (Signed)
Physical Therapy Weekly Progress Note  Patient Details  Name: Sheri Baker MRN: [id number] Date of Birth: May 03, 1951  Beginning of progress report period: March 31, 2016 End of progress report period: April 07, 2016  Today's Date: 04/07/2016 PT Individual Time: 2111-5520 PT Individual Time Calculation (min): 69 min   Patient has met 1 of 1 short term goal to continue to progress to LTG.  LTG continue to be STG due to ELOS.   Patient continues to demonstrate the following deficits: Strength, gait, balance, pain and therefore will continue to benefit from skilled PT intervention to enhance overall performance with activity tolerance, balance, postural control and knowledge of precautions.  Patient progressing toward long term goals..  Continue plan of care.  PT Short Term Goals Week 1:  PT Short Term Goal 1 (Week 1): STG=LTG due to ELOS Week 2:    PT short term goal ( week 2) : STG =LTG due to D/C date.   Skilled Therapeutic Interventions/Progress Updates:     Transfer training with supervision A from PT from vairous surface heights x 12 throughout treatment. Min cues for improved posture secondary to pain.    Car transfer with supervision A from PT with min cues for UE placement and AD management.   WC mobility for 133fx2 ft with supervision A with min cues for doorway management and direction in familiar environment. Patient able to re-orient to location after min cues from PT.   Gait training for [id number] controlled environment. PT provided supervision A and min cues for AD management.  Dynamic gait training to weave through 6 cones and step over 2 hockey sticks; Supervision A from PT. Min cues provided for AD management and step pattern to stepping over obstacle.   Stair training with 1 rail x 4 steps and supervision A from PT. PT provided min-mod cues for UE placement on 1 rail and proper step to gait pattern with increased step length to allow space for Bilat feet on  step.    Patient left sitting in recliner at end to PT session with all needs met.   Therapy Documentation Precautions:  Precautions Precautions: Back Precaution Booklet Issued: Yes (comment) Precaution Comments: Able to recall 3/3 Required Braces or Orthoses: Spinal Brace Spinal Brace: Lumbar corset Restrictions Weight Bearing Restrictions: No    Pain: Pain Assessment Pain Score: 6  - decreased top 4/10 by end of treatment session   See Function Navigator for Current Functional Status.  Therapy/Group: Individual Therapy  AustLorie Phenix7/2017, 10:58 AM

## 2016-04-07 NOTE — Progress Notes (Signed)
  [place] PHYSICAL MEDICINE & REHABILITATION     PROGRESS NOTE    Subjective/Complaints: Complains of right leg pain States pain extends from buttock to mid thigh. Pain is positional- worse sitting down, better lying down. She has not noticed any weakness.   Objective: Vital Signs: Blood pressure 123/70, pulse 96, temperature 98.6 F (37 C), temperature source Oral, resp. rate 18, height 5\' 5"  (1.651 m), weight 288 lb 6.4 oz (130.817 kg), SpO2 96 %. CBG (last 3)   Recent Labs  04/06/16 1649 04/06/16 2146 04/07/16 0647  GLUCAP 152* 116* 107*     Physical Exam:  Constitutional: She appears well-developed and well-nourished. No distress. Morbidly obese  HENT: Normocephalic and atraumatic.  Cardiovascular: Normal rate and regular rhythm Respiratory: Effort normall cta  GI: Soft. Bowel sounds are normal. There is no tenderness.    Neurological: She is alert and oriented.  Reflexes at patella Right- absent, left normal Ankle jerk- bilaterally normal.  Ext- trace edema- pretibial   Assessment/Plan: 1. Functional and mobility deficits secondary to lumbar spondylolisthesis/radiculopathy   Medical Problem List and Plan: 1. Functional and mobility deficits secondary to lumbar spondlylolisthesis and radiculopathy  -Cont CIR--making functional gains 2. DVT Prophylaxis/Anticoagulation: Mechanical: Sequential compression devices, below knee Bilateral lower extremities  3. Pain Management: leg pain today- i suspect nerve root irritation. No neuro deficit. Will observe for now.  She has a complex pain medication regimen- will not change at this time.   4. Mood: Team to provide ego support. LCSW to follow for evaluation and support.  5. Neuropsych: This patient is capable of making decisions on her own behalf. 6. Skin/Wound Care:  7. Fluids/Electrolytes/Nutrition: Cont I/Os    8. T2DM--diet controlled: tight control at present  CBG (last 3)   Recent Labs   04/06/16 1649 04/06/16 2146 04/07/16 0647  GLUCAP 152* 116* 107*    9. HTN: Monitor BP. 123/70-136/86 controlled 10 ABLA:  Added iron supplement.   Hemoglobin 9.9 on 6/14 (stable) 11. Leucocytosis: Resolved   12. Constipation: resolved  LOS (Days) 8 A FACE TO FACE EVALUATION WAS PERFORMED  Bruce H Swords 04/07/2016 8:51 AM

## 2016-04-08 ENCOUNTER — Inpatient Hospital Stay (HOSPITAL_COMMUNITY): Payer: BLUE CROSS/BLUE SHIELD | Admitting: Physical Therapy

## 2016-04-08 LAB — GLUCOSE, CAPILLARY
Glucose-Capillary: 106 mg/dL — ABNORMAL HIGH (ref 65–99)
Glucose-Capillary: 100 mg/dL — ABNORMAL HIGH (ref 65–99)
Glucose-Capillary: 124 mg/dL — ABNORMAL HIGH (ref 65–99)
Glucose-Capillary: 133 mg/dL — ABNORMAL HIGH (ref 65–99)

## 2016-04-08 NOTE — Progress Notes (Signed)
Physical Therapy Session Note  Patient Details  Name: Sheri Baker MRN: [id number] Date of Birth: 1951/01/03  Today's Date: 04/08/2016 PT Individual Time: 0808-0903 PT Individual Time Calculation (min): 55 min   Skilled Therapeutic Interventions/Progress Updates:    Pt received in w/c & agreeable to PT, noting 7/10 back pain but premedicated. Session focused on pt education and d/c planning. Discussed need for sister to come in for family training on 6/22 & pt agreeable. Discussed recommendations of home health PT & 24 hr assistance following d/c as pt continues to be limited by significant back pain. Problem solved home entry as pt reports her brother is planning to install single rail at garage door entrance but pt prefers to use alternate entrance. Pt reports alternate entrance has cinderblock step>platform step>into house & brother is planning on installing a 2nd cinderblock to make that step wider. Thoroughly educated pt on this method being very unsafe & need to use garage entrance where pt will be able to hold rail. Stair negotiation completed on 4 steps (6 inches) with L ascending rail & pt negotiating stairs laterally. Pt able to complete task x 2 trials with steady A & rest break in between. At end of session pt transported back to room in w/c & left with all needs within reach.  Therapy Documentation Precautions:  Precautions Precautions: Back Precaution Booklet Issued: Yes (comment) Precaution Comments: Able to recall 3/3 Required Braces or Orthoses: Spinal Brace Spinal Brace: Lumbar corset Restrictions Weight Bearing Restrictions: No  Pain: Pain Assessment Pain Assessment: 0-10 Pain Score: 7  Pain Location: Back Pain Intervention(s):  (premedicated)   See Function Navigator for Current Functional Status.   Therapy/Group: Individual Therapy  [name] 04/08/2016, 7:44 AM

## 2016-04-08 NOTE — Progress Notes (Signed)
  [place] PHYSICAL MEDICINE & REHABILITATION     PROGRESS NOTE    Subjective/Complaints: She feels much better this morning. She is in no pain.   Objective: Vital Signs: Blood pressure 120/67, pulse 100, temperature 98.4 F (36.9 C), temperature source Oral, resp. rate 18, height 5\' 5"  (1.651 m), weight 288 lb 6.4 oz (130.817 kg), SpO2 95 %. CBG (last 3)   Recent Labs  04/07/16 1159 04/07/16 1656 04/07/16 2055  GLUCAP 103* 104* 132*     Physical Exam:  Constitutional: She appears well-developed and well-nourished. No distress. Morbidly obese  HENT: Normocephalic and atraumatic.  Cardiovascular: Normal rate and regular rhythm Respiratory: Effort normall cta  GI: Soft. Bowel sounds are normal. There is no tenderness.    Neurological: She is alert and oriented.   Assessment/Plan: 1. Functional and mobility deficits secondary to lumbar spondylolisthesis/radiculopathy   Medical Problem List and Plan: 1. Functional and mobility deficits secondary to lumbar spondlylolisthesis and radiculopathy  -Cont CIR--making functional gains 2. DVT Prophylaxis/Anticoagulation: Mechanical: Sequential compression devices, below knee Bilateral lower extremities  3. Pain Management: leg pain today- i suspect nerve root irritation. No neuro deficit. Will observe for now.  She has a complex pain medication regimen- will not change at this time.   4. Mood: Team to provide ego support. LCSW to follow for evaluation and support.  5. Neuropsych: This patient is capable of making decisions on her own behalf. 6. Skin/Wound Care:  7. Fluids/Electrolytes/Nutrition: Cont I/Os    8. T2DM--diet controlled: tight control at present  CBG (last 3)   Recent Labs  04/07/16 1159 04/07/16 1656 04/07/16 2055  GLUCAP 103* 104* 132*    9. HTN: Monitor BP. 120/67-130/85  controlled 10 ABLA:  Added iron supplement.   Hemoglobin 9.9 on 6/14 (stable) 11. Leucocytosis: Resolved   12.  Constipation: resolved  LOS (Days) 9 A FACE TO FACE EVALUATION WAS PERFORMED  Bruce H Swords 04/08/2016 8:35 AM

## 2016-04-08 NOTE — Plan of Care (Signed)
Problem: RH Balance Goal: LTG Patient will maintain dynamic sitting balance (PT) LTG: Patient will maintain dynamic sitting balance with assistance during mobility activities (PT)  Downgrade 2/2 slow progress Goal: LTG Patient will maintain dynamic standing balance (PT) LTG: Patient will maintain dynamic standing balance with assistance during mobility activities (PT)  With LRAD; downgrade 2/2 slow progress  Problem: RH Bed Mobility Goal: LTG Patient will perform bed mobility with assist (PT) LTG: Patient will perform bed mobility with assistance, with/without cues (PT).  Downgrade 2/2 slow progress  Problem: RH Bed to Chair Transfers Goal: LTG Patient will perform bed/chair transfers w/assist (PT) LTG: Patient will perform bed/chair transfers with assistance, with/without cues (PT).  With LRAD; downgrade 2/2 slow progress  Problem: RH Furniture Transfers Goal: LTG Patient will perform furniture transfers w/assist (OT/PT LTG: Patient will perform furniture transfers with assistance (OT/PT).  Downgrade 2/2 slow progrses  Problem: RH Ambulation Goal: LTG Patient will ambulate in controlled environment (PT) LTG: Patient will ambulate in a controlled environment, # of feet with assistance (PT).  150 ft with LRAD; downgrade 2/2 slow progress Goal: LTG Patient will ambulate in home environment (PT) LTG: Patient will ambulate in home environment, # of feet with assistance (PT).  50 ft with LRAD; downgrade 2/2 slow progress Goal: LTG Patient will ambulate in community environment (PT) LTG: Patient will ambulate in community environment, # of feet with assistance (PT).  Outcome: Not Applicable Date Met:  04/08/16 D/c 2/2 slow progress  Problem: RH Wheelchair Mobility Goal: LTG Patient will propel w/c in controlled environment (PT) LTG: Patient will propel wheelchair in controlled environment, # of feet with assist (PT)  Downgrade 2/2 slow progress  Problem: RH Stairs Goal: LTG Patient  will ambulate up and down stairs w/assist (PT) LTG: Patient will ambulate up and down # of stairs with assistance (PT)  2 steps with L rail     

## 2016-04-09 ENCOUNTER — Other Ambulatory Visit: Payer: Self-pay

## 2016-04-09 ENCOUNTER — Inpatient Hospital Stay (HOSPITAL_COMMUNITY): Payer: BLUE CROSS/BLUE SHIELD

## 2016-04-09 ENCOUNTER — Inpatient Hospital Stay (HOSPITAL_COMMUNITY): Payer: BLUE CROSS/BLUE SHIELD | Admitting: Physical Therapy

## 2016-04-09 ENCOUNTER — Encounter (HOSPITAL_COMMUNITY): Payer: Self-pay | Admitting: Radiology

## 2016-04-09 DIAGNOSIS — R0781 Pleurodynia: Secondary | ICD-10-CM

## 2016-04-09 DIAGNOSIS — R0789 Other chest pain: Secondary | ICD-10-CM | POA: Diagnosis present

## 2016-04-09 DIAGNOSIS — R079 Chest pain, unspecified: Secondary | ICD-10-CM | POA: Insufficient documentation

## 2016-04-09 LAB — GLUCOSE, CAPILLARY
Glucose-Capillary: 113 mg/dL — ABNORMAL HIGH (ref 65–99)
Glucose-Capillary: 122 mg/dL — ABNORMAL HIGH (ref 65–99)
Glucose-Capillary: 117 mg/dL — ABNORMAL HIGH (ref 65–99)
Glucose-Capillary: 134 mg/dL — ABNORMAL HIGH (ref 65–99)

## 2016-04-09 MED ORDER — IOPAMIDOL (ISOVUE-370) INJECTION 76%
INTRAVENOUS | Status: AC
  Administered 2016-04-09: 100 mL
  Filled 2016-04-09: qty 100

## 2016-04-09 MED ORDER — RIVAROXABAN 20 MG PO TABS: 20.0000 mg | ORAL_TABLET | Freq: Every day | ORAL | Status: DC

## 2016-04-09 MED ORDER — RIVAROXABAN 15 MG PO TABS
15.0000 mg | ORAL_TABLET | Freq: Two times a day (BID) | ORAL | Status: DC
  Administered 2016-04-09 (×2): 15 mg via ORAL
  Filled 2016-04-09 (×3): qty 1

## 2016-04-09 NOTE — Progress Notes (Signed)
ANTICOAGULATION CONSULT NOTE - Initial Consult  Pharmacy Consult for xarelto Indication: pulmonary embolus  Allergies  Allergen Reactions  . Hydrocodone Palpitations  . Penicillins Rash    Has patient had a PCN reaction causing immediate rash, facial/tongue/throat swelling, SOB or lightheadedness with hypotension: Yes Has patient had a PCN reaction causing severe rash involving mucus membranes or skin necrosis: No Has patient had a PCN reaction that required hospitalization No Has patient had a PCN reaction occurring within the last 10 years: No If all of the above answers are "NO", then may proceed with Cephalosporin use.     Patient Measurements: Height: 5\' 5"  (165.1 cm) Weight: 288 lb 4.8 oz (130.772 kg) IBW/kg (Calculated) : 57 Heparin Dosing Weight:   Vital Signs: Temp: 99.2 F (37.3 C) (06/19 0434) Temp Source: Oral (06/19 0434) BP: 127/67 mmHg (06/19 0434) Pulse Rate: 105 (06/19 0434)  Labs: No results for input(s): HGB, HCT, PLT, APTT, LABPROT, INR, HEPARINUNFRC, HEPRLOWMOCWT, CREATININE, CKTOTAL, CKMB, TROPONINI in the last 72 hours.  Estimated Creatinine Clearance: 91.2 mL/min (by C-G formula based on Cr of 0.84).   Medical History: Past Medical History  Diagnosis Date  . Hypertension   . Dysrhythmia     "palpations with full dose of hydrocodone"- takes 1/2 of Hydrocodone- Acetaminophen and does not have palpations  . 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 Hypothyroidism   . GERD (gastroesophageal reflux disease)   . Constipation   . History of hiatal hernia   . Hemorrhoids   . Anemia   . Arthritis   . Headache     occasional    Medications:  Scheduled:  . feeding supplement (PRO-STAT SUGAR FREE 64)  30 mL Oral BID  . gabapentin  600 mg Oral TID  . spironolactone  25 mg Oral Daily   And  . hydrochlorothiazide  25 mg Oral Daily  . insulin aspart  0-15 Units Subcutaneous TID WC  . insulin aspart  0-5 Units  Subcutaneous QHS  . iron polysaccharides  150 mg Oral Daily  . methocarbamol  750 mg Oral QID  . oxyCODONE  10 mg Oral Q12H  . pantoprazole  40 mg Oral Daily  . rosuvastatin  20 mg Oral q1800  . senna-docusate  2 tablet Oral BID  . thyroid  15 mg Oral QAC breakfast  . traMADol  50 mg Oral TID WC & HS   Infusions:    Assessment: 65 yo female with PE will be started on xarelto.  CrCl ~92.  Hgb 9.9 and Plt 233 K on 06/14  Goal of Therapy:   Monitor platelets by anticoagulation protocol: Yes   Plan:  - xarelto 15 mg po bid x 3 weeks, then 20 mg daily with supper. - monitor for bleeding  So, Tsz-Yin 04/09/2016,11:45 AM

## 2016-04-09 NOTE — Progress Notes (Addendum)
Physical Therapy Session Note  Patient Details  Name: Sheri Baker MRN: [id number] Date of Birth: February 04, 1951  Today's Date: 04/09/2016 PT Individual Time: [id number] and [id number] PT Individual Time Calculation (min): 57 min and 23 min Pt missed 37 + 90 min of scheduled PT time due to MD hold (pt has bilateral PE's)  Short Term Goals: Week 1:  PT Short Term Goal 1 (Week 1): STG=LTG due to ELOS  Skilled Therapeutic Interventions/Progress Updates:    Treatment 1: MD in room upon PT arrival & verbally cleared pt for therapy. Pt received in bed & agreeable to PT treatment, noting 7/10 buttock pain & 5/10 L ribcage pain (MD aware). RN notified & administered pain medication during session. Pt's brother plans to install rail & increase safety of home entry at pt's living room; pt's brother to bring in picture of entrance on Grad Day. Pt able to transfer supine>sitting EOB with HOB elevated & c/o pain when PT tried to flatten bed. Transferred sit<>stand with steady A/supervision throughout session requiring cuing for proper hand placement on stable surface before transferring. Pt tolerated standing at sink ~2-3 minutes with 1UE support to brush teeth & wash face with supervision A. Gait training x 180 ft with RW & steady A fade to supervision with cuing to increase foot clearance BLE as pt tends to drag feet. In rehab apartment pt able to transfer supine<>sit with supervision on regular bed with pillows positioned to provide increased back support. Pt transported back to room via w/c total A & left in w/c with all needs within reach & set up with breakfast tray.   Treatment 2: Upon therapist's arrival PT discussed with RN the need, per MD, to await CTA results before initiating treatment. RN discussed this with PA who verbally cleared pt for PT treatment. Pt noted 7/10 buttocks & posterior thigh pain but reported L ribcage pain has decreased since this AM. Pt reported need to use restroom & ambulated 15 ft  bed>bathroom, completed toilet transfers with steady A & use of grab bars, (+) void. Pt able to perform peri-hygiene independently with AE then ambulated to sink with RW & performed hand hygiene with supervision A while standing. Pt transferred to w/c & self propelled w/c x 180 ft room>gym with BUE & supervision. Pt completed stand pivot w/c>nu-step with RW & supervision with pt able to demonstrate proper hand placement with subtle cuing from therapist. When setting pt up on nu-step RN arrived and requested pt return to bed. Pt completed stand pivot nu-step>w/c in same manner as noted above. Therapist transported pt back to room in w/c total A & pt left in room with NT present. PT exited room & discussed situation with RN who reported pt has B PE's and is to be on bed rest for remainder of day.  Will follow up per plan of care & as pt's status changes.   Therapy Documentation Precautions:  Precautions Precautions: Back Precaution Booklet Issued: Yes (comment) Precaution Comments: Able to recall 3/3 Required Braces or Orthoses: Spinal Brace Spinal Brace: Lumbar corset Restrictions Weight Bearing Restrictions: No  Pain: Pain Assessment Pain Assessment: 0-10 Pain Score: 7  Pain Location: Rib cage Pain Orientation: Left Pain Intervention(s): RN made aware   See Function Navigator for Current Functional Status.   Therapy/Group: Individual Therapy  [name] 04/09/2016, 7:57 AM

## 2016-04-09 NOTE — Progress Notes (Signed)
[name]     PROGRESS NOTE    Subjective/Complaints: Patient seen lying in bed this morning. She complains a left chest wall pain.  ROS: + Left chest wall pain. Denies SOB, N/V/D.   Objective: Vital Signs: Blood pressure 127/67, pulse 105, temperature 99.2 F (37.3 C), temperature source Oral, resp. rate 18, height 5\' 5"  (1.651 m), weight 130.772 kg (288 lb 4.8 oz), SpO2 95 %. Dg Chest 2 View  04/09/2016  CLINICAL DATA:  Short of breath since last night EXAM: CHEST  2 VIEW COMPARISON:  None. FINDINGS: Normal heart size. Lungs are under aerated. Vascular congestion without pulmonary edema. No pneumothorax or pleural effusion. IMPRESSION: Vascular congestion without pulmonary edema. Electronically Signed   By: Marybelle Killings M.D.   On: 04/09/2016 07:27   No results for input(s): WBC, HGB, HCT, PLT in the last 72 hours. No results for input(s): NA, K, CL, GLUCOSE, BUN, CREATININE, CALCIUM in the last 72 hours.  Invalid input(s): CO CBG (last 3)   Recent Labs  04/08/16 1600 04/08/16 2127 04/09/16 0648  GLUCAP 124* 133* 113*    Wt Readings from Last 3 Encounters:  04/09/16 130.772 kg (288 lb 4.8 oz)  03/27/16 138.71 kg (305 lb 12.8 oz)  03/20/16 129 kg (284 lb 6.3 oz)    Physical Exam:  Constitutional: She appears well-developed and well-nourished. No distress. Morbidly obese  HENT: Normocephalic and atraumatic.  Eyes: Conjunctivae and Conj are normal.   Cardiovascular: Normal rate and regular rhythm. No murmur heard. Respiratory: Effort normal. No stridor. She has no wheezes.  GI: Soft. Bowel sounds are normal. There is no tenderness.  Musculoskeletal: + TTP left chest wall under the breast. She exhibits edema. Trace to 1+ pretibial edema  Neurological: She is alert and oriented.  B/l UE strength 4+/5.  RLE: 4-/5 proximally, 4+/5 distally LLE: 4/5 proximally, 4+/5 distally Skin: Skin is warm and dry. She is not diaphoretic.  Lower  back incision c/d/i. Psychiatric: Her speech is normal. Judgment and thought content normal. Less anxious. Cognition and memory are normal.   Assessment/Plan: 1. Functional and mobility deficits secondary to lumbar spondylolisthesis/radiculopathy which require 3+ hours per day of interdisciplinary therapy in a comprehensive inpatient rehab setting. Physiatrist is providing close team supervision and 24 hour management of active medical problems listed below. Physiatrist and rehab team continue to assess barriers to discharge/monitor patient progress toward functional and medical goals.  Function:  Bathing Bathing position   Position: Shower  Bathing parts Body parts bathed by patient: Right arm, Left arm, Chest, Abdomen, Front perineal area, Buttocks, Right upper leg, Left upper leg, Right lower leg, Left lower leg, Back Body parts bathed by helper: Buttocks  Bathing assist Assist Level: Touching or steadying assistance(Pt > 75%)      Upper Body Dressing/Undressing Upper body dressing   What is the patient wearing?: Pull over shirt/dress, Orthosis, Bra Bra - Perfomed by patient: Thread/unthread right bra strap, Thread/unthread left bra strap, Hook/unhook bra (pull down sports bra)   Pull over shirt/dress - Perfomed by patient: Thread/unthread right sleeve, Thread/unthread left sleeve, Put head through opening, Pull shirt over trunk       Orthosis activity level: Performed by patient  Upper body assist Assist Level: Set up   Set up : To obtain clothing/put away  Lower Body Dressing/Undressing Lower body dressing   What is the patient wearing?: Underwear, Pants, Non-skid slipper socks, Ted Hose Underwear - Performed by patient: Thread/unthread right underwear leg,  Thread/unthread left underwear leg, Pull underwear up/down   Pants- Performed by patient: Thread/unthread right pants leg, Thread/unthread left pants leg, Pull pants up/down Pants- Performed by helper: Pull pants  up/down Non-skid slipper socks- Performed by patient: Don/doff right sock, Don/doff left sock Non-skid slipper socks- Performed by helper: Don/doff right sock, Don/doff left sock (secondary to time constraints)               TED Hose - Performed by helper: Don/doff right TED hose, Don/doff left TED hose  Lower body assist Assist for lower body dressing: Touching or steadying assistance (Pt > 75%)      Toileting Toileting   Toileting steps completed by patient: Adjust clothing prior to toileting, Performs perineal hygiene, Adjust clothing after toileting Toileting steps completed by helper: Adjust clothing after toileting Toileting Assistive Devices: Grab bar or rail  Toileting assist Assist level: Supervision or verbal cues   Transfers Chair/bed transfer   Chair/bed transfer method: Ambulatory Chair/bed transfer assist level: Supervision or verbal cues Chair/bed transfer assistive device: Armrests, Medical sales representative     Max distance: 176ft Assist level: Supervision or verbal cues   Wheelchair   Type: Manual Max wheelchair distance: 136ft Assist Level: Supervision or verbal cues  Cognition Comprehension Comprehension assist level: Follows complex conversation/direction with no assist  Expression Expression assist level: Expresses complex ideas: With no assist  Social Interaction Social Interaction assist level: Interacts appropriately with others - No medications needed.  Problem Solving Problem solving assist level: Solves complex problems: Recognizes & self-corrects  Memory Memory assist level: Complete Independence: No helper   Medical Problem List and Plan: 1. Functional and mobility deficits secondary to lumbar spondlylolisthesis and radiculopathy  -Cont CIR 2. DVT Prophylaxis/Anticoagulation: Mechanical: Sequential compression devices, below knee Bilateral lower extremities   CTA ordered on 6/19 for chest pain 3. Pain Management: Changed  hydrocodone (causes palpitations) to oxycodone prn. Scheduled tramadol for better pain control.   -robaxin helpful for pain-  750mg  qid scheduled  -utilizing ice/heat also  -Gabapentin increased to 600 3 times a day at 6/13, with improvement in symptoms   -OxyContin 10 mg twice a day started on 6/14---wean prior to discharge 4. Mood: Team to provide ego support. LCSW to follow for evaluation and support.  5. Neuropsych: This patient is capable of making decisions on her own behalf. 6. Skin/Wound Care: Monitor wound daily for healing. Maintain adequate nutritional and hydration status.  7. Fluids/Electrolytes/Nutrition: Cont I/Os  Mild hyponatremia: 135 on 6/14.   8. T2DM--diet controlled: tight control at present  9. HTN: Monitor BP. On aldactone/HCTZ.  Cozaar--continue to hold. Encourage fluid intake.  10 ABLA:  Added iron supplement.   Hemoglobin 9.9 on 6/14 (stable) 11. Leucocytosis: Resolved  No fevers or other signs of infection.   WBCs 8.9 on 6/14  Will continue to monitor 12. Constipation:  Increased bowel meds on 6/12, with results 13. Left chest pain  CXR 6/19 reviewed with vascular congestion  ECG reviewed, sinus tach  CTA ordered  LOS (Days) 10 A FACE TO FACE EVALUATION WAS PERFORMED  Ankit [name] 04/09/2016 9:23 AM

## 2016-04-09 NOTE — Progress Notes (Signed)
Occupational Therapy Note  Patient Details  Name: Sheri Baker MRN: [id number] Date of Birth: 09-14-1951  Today's Date: 04/09/2016 OT Missed Time: 2 Minutes Missed Time Reason: Unavailable (comment);Other (comment) (off unit for CTA)  Pt missed 60 mins skilled OT services.  Pt awaiting transport for CTA.    [name] [facility] 04/09/2016, 10:26 AM

## 2016-04-09 NOTE — Discharge Instructions (Addendum)
Inpatient Rehab Discharge Instructions  Sheri Baker Discharge date and time:  04/18/16  Activities/Precautions/ Functional Status: Activity: Maintain back precautions. No lifting, driving, or strenuous exercise for till cleared by MD Diet: diabetic diet Wound Care: keep wound clean and dry   Functional status:  ___ No restrictions     ___ Walk up steps independently ___ 24/7 supervision/assistance   ___ Walk up steps with assistance _X__ Intermittent supervision/assistance  _X__ Bathe/dress independently ___ Walk with walker     ___ Bathe/dress with assistance _X__ Walk Independently    ___ Shower independently ___ Walk with assistance    ___ Shower with assistance ___ No alcohol     ___ Return to work/school ________   Special Instructions: 1. Wear brace when at edge of bed or out of bed.  2. You need to set follow up appointment with your primary MD for post op hospital follow as soon as possible.  3.  Check blood sugars 2-3 times a day.    COMMUNITY REFERRALS UPON DISCHARGE:    Home Health:   PT& RN   [place] [id number] Date of last service:04/18/2016  Medical Equipment/Items Ordered:[facility] [place]   [phone]    My questions have been answered and I understand these instructions. I will adhere to these goals and the provided educational materials after my discharge from the hospital.  Patient/Caregiver Signature _______________________________ Date __________  Clinician Signature _______________________________________ Date __________  Please bring this form and your medication list with you to all your follow-up doctor's appointments.    Information on my medicine - XARELTO (rivaroxaban)  This medication education was reviewed with me or my healthcare representative as part of my discharge preparation.   WHY WAS XARELTO PRESCRIBED FOR YOU? Xarelto was  prescribed to treat blood clots that may have been found in the veins of your legs (deep vein thrombosis) or in your lungs (pulmonary embolism) and to reduce the risk of them occurring again.  What do you need to know about Xarelto? The starting dose is one 15 mg tablet taken TWICE daily with food for the FIRST 21 DAYS then on (enter date)  04/30/16  the dose is changed to one 20 mg tablet taken ONCE A DAY with your evening meal.  DO NOT stop taking Xarelto without talking to the health care provider who prescribed the medication.  Refill your prescription for 20 mg tablets before you run out.  After discharge, you should have regular check-up appointments with your healthcare provider that is prescribing your Xarelto.  In the future your dose may need to be changed if your kidney function changes by a significant amount.  What do you do if you miss a dose? If you are taking Xarelto TWICE DAILY and you miss a dose, take it as soon as you remember. You may take two 15 mg tablets (total 30 mg) at the same time then resume your regularly scheduled 15 mg twice daily the next day.  If you are taking Xarelto ONCE DAILY and you miss a dose, take it as soon as you remember on the same day then continue your regularly scheduled once daily regimen the next day. Do not take two doses of Xarelto at the same time.   Important Safety Information Xarelto is a blood thinner medicine that can cause bleeding. You should call your healthcare provider right away if you experience any of the following: ? Bleeding from an injury or your  nose that does not stop. ? Unusual colored urine (red or dark brown) or unusual colored stools (red or black). ? Unusual bruising for unknown reasons. ? A serious fall or if you hit your head (even if there is no bleeding).  Some medicines may interact with Xarelto and might increase your risk of bleeding while on Xarelto. To help avoid this, consult your healthcare provider  or pharmacist prior to using any new prescription or non-prescription medications, including herbals, vitamins, non-steroidal anti-inflammatory drugs (NSAIDs) and supplements.  This website has more information on Xarelto: [email or web address]

## 2016-04-09 NOTE — Progress Notes (Signed)
At around 5 am patient complained of sharp pain in her left rib when she breath. Upon assessment she denied any SOB. O2 was 95% RA. Patient stated that she has had this pain beofre and that it is nothing new, Oxy 10 mg PRN given but didn't help. Dan PA notified; he ordered ECG. ECG done and it shows Sinus tarchy. Rapid response paged; The came and assess and recommended X-ray. Dan PA notified and he ordered X-ray 2 view. Patient was taking to radiology for  X-ray. Pass it in report, we continue to monitor.

## 2016-04-10 ENCOUNTER — Inpatient Hospital Stay (HOSPITAL_COMMUNITY): Payer: BLUE CROSS/BLUE SHIELD | Admitting: *Deleted

## 2016-04-10 ENCOUNTER — Inpatient Hospital Stay (HOSPITAL_COMMUNITY): Payer: BLUE CROSS/BLUE SHIELD | Admitting: Physical Therapy

## 2016-04-10 ENCOUNTER — Inpatient Hospital Stay (HOSPITAL_COMMUNITY): Payer: BLUE CROSS/BLUE SHIELD

## 2016-04-10 ENCOUNTER — Inpatient Hospital Stay (HOSPITAL_COMMUNITY): Payer: BLUE CROSS/BLUE SHIELD | Admitting: Occupational Therapy

## 2016-04-10 DIAGNOSIS — I2699 Other pulmonary embolism without acute cor pulmonale: Secondary | ICD-10-CM

## 2016-04-10 DIAGNOSIS — F411 Generalized anxiety disorder: Secondary | ICD-10-CM

## 2016-04-10 DIAGNOSIS — R071 Chest pain on breathing: Secondary | ICD-10-CM

## 2016-04-10 DIAGNOSIS — R52 Pain, unspecified: Secondary | ICD-10-CM

## 2016-04-10 LAB — CBC WITH DIFFERENTIAL/PLATELET
Basophils Absolute: 0 10*3/uL (ref 0.0–0.1)
Basophils Absolute: 0.1 10*3/uL (ref 0.0–0.1)
Basophils Relative: 0 %
Basophils Relative: 1 %
Eosinophils Absolute: 0.1 10*3/uL (ref 0.0–0.7)
Eosinophils Absolute: 0 10*3/uL (ref 0.0–0.7)
Eosinophils Relative: 0 %
Eosinophils Relative: 0 %
HCT: 33.4 % — ABNORMAL LOW (ref 36.0–46.0)
HCT: 31.5 % — ABNORMAL LOW (ref 36.0–46.0)
Hemoglobin: 10.6 g/dL — ABNORMAL LOW (ref 12.0–15.0)
Hemoglobin: 9.9 g/dL — ABNORMAL LOW (ref 12.0–15.0)
Lymphocytes Relative: 18 %
Lymphocytes Relative: 11 %
Lymphs Abs: 2.2 10*3/uL (ref 0.7–4.0)
Lymphs Abs: 1.3 10*3/uL (ref 0.7–4.0)
MCH: 27.1 pg (ref 26.0–34.0)
MCH: 26.8 pg (ref 26.0–34.0)
MCHC: 31.7 g/dL (ref 30.0–36.0)
MCHC: 31.4 g/dL (ref 30.0–36.0)
MCV: 85.4 fL (ref 78.0–100.0)
MCV: 85.1 fL (ref 78.0–100.0)
Monocytes Absolute: 1.3 10*3/uL — ABNORMAL HIGH (ref 0.1–1.0)
Monocytes Absolute: 1.2 10*3/uL — ABNORMAL HIGH (ref 0.1–1.0)
Monocytes Relative: 11 %
Monocytes Relative: 10 %
Neutro Abs: 8.5 10*3/uL — ABNORMAL HIGH (ref 1.7–7.7)
Neutro Abs: 9 10*3/uL — ABNORMAL HIGH (ref 1.7–7.7)
Neutrophils Relative %: 71 %
Neutrophils Relative %: 78 %
Platelets: 65 10*3/uL — ABNORMAL LOW (ref 150–400)
Platelets: 72 10*3/uL — ABNORMAL LOW (ref 150–400)
RBC: 3.91 MIL/uL (ref 3.87–5.11)
RBC: 3.7 MIL/uL — ABNORMAL LOW (ref 3.87–5.11)
RDW: 13.7 % (ref 11.5–15.5)
RDW: 13.6 % (ref 11.5–15.5)
WBC: 12 10*3/uL — ABNORMAL HIGH (ref 4.0–10.5)
WBC: 11.6 10*3/uL — ABNORMAL HIGH (ref 4.0–10.5)

## 2016-04-10 LAB — BASIC METABOLIC PANEL
Anion gap: 10 (ref 5–15)
BUN: 10 mg/dL (ref 6–20)
CO2: 27 mmol/L (ref 22–32)
Calcium: 9.2 mg/dL (ref 8.9–10.3)
Chloride: 96 mmol/L — ABNORMAL LOW (ref 101–111)
Creatinine, Ser: 0.92 mg/dL (ref 0.44–1.00)
GFR calc Af Amer: >60 mL/min (ref >60)
GFR calc non Af Amer: >60 mL/min (ref >60)
Glucose, Bld: 111 mg/dL — ABNORMAL HIGH (ref 65–99)
Potassium: 3.8 mmol/L (ref 3.5–5.1)
Sodium: 133 mmol/L — ABNORMAL LOW (ref 135–145)

## 2016-04-10 LAB — URINALYSIS, ROUTINE W REFLEX MICROSCOPIC
Bilirubin Urine: NEGATIVE
Glucose, UA: NEGATIVE mg/dL
Ketones, ur: NEGATIVE mg/dL
Leukocytes, UA: NEGATIVE
Nitrite: NEGATIVE
Protein, ur: NEGATIVE mg/dL
Specific Gravity, Urine: 1.034 — ABNORMAL HIGH (ref 1.005–1.030)
pH: 6 (ref 5.0–8.0)

## 2016-04-10 LAB — URINE MICROSCOPIC-ADD ON

## 2016-04-10 LAB — GLUCOSE, CAPILLARY
Glucose-Capillary: 126 mg/dL — ABNORMAL HIGH (ref 65–99)
Glucose-Capillary: 128 mg/dL — ABNORMAL HIGH (ref 65–99)
Glucose-Capillary: 119 mg/dL — ABNORMAL HIGH (ref 65–99)
Glucose-Capillary: 127 mg/dL — ABNORMAL HIGH (ref 65–99)

## 2016-04-10 MED ORDER — LIDOCAINE 5 % EX PTCH
1.0000 | MEDICATED_PATCH | CUTANEOUS | Status: DC
  Administered 2016-04-10 – 2016-04-18 (×9): 1 via TRANSDERMAL
  Filled 2016-04-10 (×9): qty 1

## 2016-04-10 MED ORDER — RIVAROXABAN 15 MG PO TABS
15.0000 mg | ORAL_TABLET | Freq: Two times a day (BID) | ORAL | Status: DC
  Administered 2016-04-10 – 2016-04-18 (×16): 15 mg via ORAL
  Filled 2016-04-10 (×17): qty 1

## 2016-04-10 MED ORDER — CLONAZEPAM 0.5 MG PO TABS
0.2500 mg | ORAL_TABLET | Freq: Two times a day (BID) | ORAL | Status: DC | PRN
  Administered 2016-04-10 – 2016-04-15 (×3): 0.25 mg via ORAL
  Filled 2016-04-10 (×3): qty 1

## 2016-04-10 MED ORDER — RIVAROXABAN 20 MG PO TABS: 20.0000 mg | ORAL_TABLET | Freq: Every day | ORAL | Status: DC

## 2016-04-10 NOTE — Progress Notes (Signed)
Physical Therapy Session Note  Patient Details  Name: Sheri Baker MRN: [id number] Date of Birth: 1951/03/03  Today's Date: 04/10/2016 PT Individual Time: 0803-0901 PT Individual Time Calculation (min): 58 min   Skilled Therapeutic Interventions/Progress Updates:  Verbal orders from MD & RN (who re-confirmed with MD) that pt is no longer on bed rest & allowed to participate in therapies. Pt received in bed on 2L/min supplemental oxygen via nasal cannula & noting 7/10 L ribcage pain; RN made aware. Pt transferred supine>sit with supervision & use of bed features; pt able to don new hospital gowns with min A. Pt with significant pain with movement & while sitting EOB with short shallow breaths but able to correct with cuing from PT for pursed lip breathing; pt required several prolonged seated rest breaks throughout session 2/2 pain & for pursed lip breathing. Pt able to don back brace with Min A & pt requested to don ted hose with PT providing total A for this. Pt completed stand pivot bed>w/c with RW & steady A/supervision. Transported pt to sink & pt able to tolerate standing ~2-3 minutes with UE support to brush teeth with close supervision. Pt's vitals at rest: SpO2 = 96%, HR = 116 bpm but after standing at sink HR = 131 bpm with pt diaphoretic. RN aware of these changes. Pt concerned about wearing supplemental oxygen upon d/c & therapist provided education. At end of session pt left sitting in w/c on 2L/min supplemental oxygen via nasal cannula & set up with breakfast tray.   Therapy Documentation Precautions:  Precautions Precautions: Back Precaution Booklet Issued: Yes (comment) Precaution Comments: Able to recall 3/3 Required Braces or Orthoses: Spinal Brace Spinal Brace: Lumbar corset Restrictions Weight Bearing Restrictions: No  Pain: Pain Assessment Pain Assessment: 0-10 Pain Score: 6  Pain Type: Acute pain Pain Location: Rib cage Pain Descriptors / Indicators: Sharp Pain  Frequency: Constant Pain Onset: Gradual Patients Stated Pain Goal: 2 Pain Intervention(s): Medication (See eMAR)   See Function Navigator for Current Functional Status.   Therapy/Group: Individual Therapy  [name] 04/10/2016, 7:49 AM

## 2016-04-10 NOTE — Plan of Care (Signed)
Problem: RH PAIN MANAGEMENT Goal: RH STG PAIN MANAGED AT OR BELOW PT'S PAIN GOAL Less than 3,on 1 to 10 scale  Outcome: Not Progressing Patient continue to complain of pain pf 10

## 2016-04-10 NOTE — Progress Notes (Signed)
Physical Therapy Session Note  Patient Details  Name: Sheri Baker MRN: [id number] Date of Birth: 1951-06-11  Today's Date: 04/10/2016    Pt missed 45 minutes scheduled PT treatment. Pt currently on MD hold for bilateral PE's. Will follow up per plan of care.  Therapy Documentation Precautions:  Precautions Precautions: Back Precaution Booklet Issued: Yes (comment) Precaution Comments: Able to recall 3/3 Required Braces or Orthoses: Spinal Brace Spinal Brace: Lumbar corset Restrictions Weight Bearing Restrictions: No   General: PT Amount of Missed Time (min): 45 Minutes PT Missed Treatment Reason: MD hold (Comment)   See Function Navigator for Current Functional Status.   Therapy/Group: Individual Therapy  [name] 04/10/2016, 5:16 PM

## 2016-04-10 NOTE — Progress Notes (Signed)
Received a call from xray, incorrect order was placed if MD wants a venous doppler placed. Xray wants to confirm if that is what MD wants. Spoke to Dr. Posey Pronto, who confirmed that he would like a venous doppler. Incorrect order will be discontinued, and correct order placed for correct procedure, per x-ray

## 2016-04-10 NOTE — Progress Notes (Signed)
AT around 9:10 pm, pt complained of right side pain oxy 10 mg was given and she went to sleep. By 1 am she was up crying with the same pain; When assessed, patient was having shallow breathing and HH was 134/min; set of vitals obtained, oxy 10 mg given; rapid response paged; on call notified, he ordered to give Trazodone 50 mg; patient went to sleep. Patient was monitor closely set of vitals taken qhrs. When ask she said "pain is little bit better". Patient currently calm no sign of distress. We continue to monitor.

## 2016-04-10 NOTE — Progress Notes (Signed)
*  Preliminary Results* Bilateral lower extremity venous duplex completed. Study was technically limited due to patient body habitus. Visualized veins of bilateral lower extremities are negative for deep vein thrombosis. There is no evidence of Baker's cyst bilaterally.  04/10/2016  Maudry Mayhew, RVT, RDCS, RDMS

## 2016-04-10 NOTE — Progress Notes (Signed)
Pt on bedrest for most of this shift. Reports improvement in discomfort compared to last night. Lidocaine patch on L rib cage. Per EKG, in sinus tachycardia. Remains anxious most of shift, anxiety relieved by ordered Clonazepam, pain by scheduled pain medication.

## 2016-04-10 NOTE — Progress Notes (Addendum)
Call received at 0135 regarding Pt with acute left side chest pain. Advised RN to get full set of VS while RRT RN en route. Advised to give ordered PRN oxycodone 10 mg PO as well. Upon my arrival Pt found resting in bed, tearful but more calm than before per floor RN. BP 130/74 HR 132  Po2 97-100 on 2 LNC, shallow breathing RR 25, lung sounds clear diminished. Pt able to follow simple commands, not able to speak more than a few words at this time due to pain and increased RR. Silvestre Mesi PA on call paged and updated on Pt status, agreed to give trazodone 50 mg PRN dose already ordered and reassess in 1 hr. Floor RN and Patient in agreement with plan of care for now. RRT will follow tonight, no RRT interventions at this time.

## 2016-04-10 NOTE — Progress Notes (Signed)
Social Work Patient ID: Sheri Baker, female   DOB: 08-Jul-1951, 65 y.o.   MRN: [id number] Met with pt and brother who was here to answer his questions regarding discharge. Dan-PA was in to answer his medical questions and he is aware discharge date Thursday will change once Team conference tomorrow, need to get pt back to her prior level before she became ill. He was in agreement with this. He has built a ramp for pt's home. Will update pt and her sister since she will be The one with her at discharge.

## 2016-04-10 NOTE — Progress Notes (Signed)
Occupational Therapy Session Note  Patient Details  Name: Sheri Baker MRN: [id number] Date of Birth: 1951-06-03  Today's Date: 04/10/2016 OT Individual Time:  -    , Today's Date: 04/10/2016 OT Group Time:  -    , Today's Date: 04/10/2016 OT Concurrent Time:  -    and Today's Date: 04/10/2016 OT Co-Treatment Time:  -      Short Term Goals: Week 1:  OT Short Term Goal 1 (Week 1): Bathe seated on tub bench using AE prn with min assist for thoroughness OT Short Term Goal 2 (Week 1): Dress lower body, sitting and standing using AE prn to maintian back precautions OT Short Term Goal 3 (Week 1): Complete light meal prep using LRAD and AE prn with instructional cues OT Short Term Goal 4 (Week 1): Maintain back precautions 100% of the time during performance of BADL for 1 hour with min vc  Skilled Therapeutic Interventions/Progress Updates:    Pt tx missed due to pt orders for bed rest due to LE PE with nursing agreement to place pt on hold for therapy. Missed tx minutes: 60 minutes  Therapy Documentation Precautions:  Precautions Precautions: Back Precaution Booklet Issued: Yes (comment) Precaution Comments: Able to recall 3/3 Required Braces or Orthoses: Spinal Brace Spinal Brace: Lumbar corset Restrictions Weight Bearing Restrictions: No General PT Missed Treatment Reason: MD hold (Comment) (bedrest 2/2 PE bilat)   : ADL ADL Comments: see Assessment Tool  :    See Function Navigator for Current Functional Status.   Therapy/Group: Individual Therapy  Michaela A Hoffman 04/10/2016, 2:51 PM

## 2016-04-10 NOTE — Progress Notes (Signed)
Spoke at length with hematology services in regards to thrombocytopenia. Recommendations are to continue XARELTO for now and monitor for platelets daily. If platelets were to drop below 50,000 every consult hematology services. It was also recommended to Protonix for now as this could also cause some thrombocytopenia. All issues were discussed with patient.

## 2016-04-10 NOTE — Progress Notes (Signed)
Physical Therapy Note  Patient Details  Name: Sheri Baker MRN: [id number] Date of Birth: April 30, 1951 Today's Date: 04/10/2016    Pt missed 30 minutes of skilled PT.  Continues to have active bed rest orders 2/2 bilat PE.  Will f/u as able.    Caitlin E Penven-Crew 04/10/2016, 1:56 PM

## 2016-04-10 NOTE — Progress Notes (Signed)
ANTICOAGULATION CONSULT NOTE - Follow Up Consult  Pharmacy Consult for xarelto Indication: pulmonary embolus  Allergies  Allergen Reactions  . Hydrocodone Palpitations  . Penicillins Rash    Has patient had a PCN reaction causing immediate rash, facial/tongue/throat swelling, SOB or lightheadedness with hypotension: Yes Has patient had a PCN reaction causing severe rash involving mucus membranes or skin necrosis: No Has patient had a PCN reaction that required hospitalization No Has patient had a PCN reaction occurring within the last 10 years: No If all of the above answers are "NO", then may proceed with Cephalosporin use.     Patient Measurements: Height: 5\' 5"  (165.1 cm) Weight: 288 lb 4.8 oz (130.772 kg) IBW/kg (Calculated) : 57 Heparin Dosing Weight:   Vital Signs: Temp: 98.3 F (36.8 C) (06/20 0551) Temp Source: Oral (06/20 0551) BP: 111/74 mmHg (06/20 0551) Pulse Rate: 117 (06/20 0551)  Labs:  Recent Labs  04/10/16 0549 04/10/16 1113  HGB 10.6* 9.9*  HCT 33.4* 31.5*  PLT 65* 72*  CREATININE 0.92  --     Estimated Creatinine Clearance: 83.2 mL/min (by C-G formula based on Cr of 0.92).   Medications:  Scheduled:  . feeding supplement (PRO-STAT SUGAR FREE 64)  30 mL Oral BID  . gabapentin  600 mg Oral TID  . spironolactone  25 mg Oral Daily   And  . hydrochlorothiazide  25 mg Oral Daily  . insulin aspart  0-15 Units Subcutaneous TID WC  . insulin aspart  0-5 Units Subcutaneous QHS  . iron polysaccharides  150 mg Oral Daily  . lidocaine  1 patch Transdermal Q24H  . methocarbamol  750 mg Oral QID  . oxyCODONE  10 mg Oral Q12H  . rivaroxaban  15 mg Oral BID WC   Followed by  . [START ON 04/30/2016] rivaroxaban  20 mg Oral Q supper  . rosuvastatin  20 mg Oral q1800  . senna-docusate  2 tablet Oral BID  . thyroid  15 mg Oral QAC breakfast  . traMADol  50 mg Oral TID WC & HS   Infusions:    Assessment: 65 yo female with PE will be resumed on xarelto  per hem/onc recommendation even though her Plt has dropped to 72 K Goal of Therapy:   Monitor platelets by anticoagulation protocol: Yes   Plan:  - continue xarelto 15 mg po bid and change to 20 mg daily on 07/10  - monitor CBC (esp Plt) closely  - monitor for bleeding   So, Sheri Baker 04/10/2016,1:08 PM

## 2016-04-10 NOTE — Progress Notes (Signed)
[place] PHYSICAL MEDICINE & REHABILITATION     PROGRESS NOTE    Subjective/Complaints: Pt seen sitting up in bed this AM.  She states she had a terrible ngiht.  She has anxiety.  Educated pt on use of IS.  ROS: + Left chest wall pain. Denies SOB, N/V/D.   Objective: Vital Signs: Blood pressure 111/74, pulse 117, temperature 98.3 F (36.8 C), temperature source Oral, resp. rate 18, height 5\' 5"  (1.651 m), weight 130.772 kg (288 lb 4.8 oz), SpO2 100 %. Dg Chest 2 View  04/09/2016  CLINICAL DATA:  Short of breath since last night EXAM: CHEST  2 VIEW COMPARISON:  None. FINDINGS: Normal heart size. Lungs are under aerated. Vascular congestion without pulmonary edema. No pneumothorax or pleural effusion. IMPRESSION: Vascular congestion without pulmonary edema. Electronically Signed   By: Marybelle Killings M.D.   On: 04/09/2016 07:27   Ct Angio Chest Pe W Or Wo Contrast  04/09/2016  CLINICAL DATA:  Chest pain and short of breath since this morning. Presently, it is resolved. EXAM: CT ANGIOGRAPHY CHEST WITH CONTRAST TECHNIQUE: Multidetector CT imaging of the chest was performed using the standard protocol during bolus administration of intravenous contrast. Multiplanar CT image reconstructions and MIPs were obtained to evaluate the vascular anatomy. CONTRAST:  100 cc Isovue 370 COMPARISON:  None. FINDINGS: There is filling defect in the main peripheral right pulmonary artery extending into right upper, middle, and lower lobe lobar as well as segmental branches. There is segmental pulmonary thromboembolism within branches in the lingula. See image 117 of series 407. RV to LV ratio is 1.0. This is borderline for right heart strain. There is no evidence of aortic dissection or aneurysm. Great vessels are grossly patent within the confines of the examination. No abnormal mediastinal adenopathy. No pneumothorax.  No pleural effusion Dependent atelectasis bilaterally. No vertebral compression deformity. There is  no evidence of reflux of contrast into the hepatic veins. Review of the MIP images confirms the above findings. IMPRESSION: The study is positive for bilateral acute pulmonary thromboembolism. There is borderline right heart strain as described above. Correlation with echocardiogram, electrocardiogram and troponin levels can be performed to stratify severity. Critical Value/emergent results were called by telephone at the time of interpretation on 04/09/2016 at 11:26 am to Dr. Susann Givens, RN, who verbally acknowledged these results. Electronically Signed   By: Marybelle Killings M.D.   On: 04/09/2016 11:27    Recent Labs  04/10/16 0549  WBC 12.0*  HGB 10.6*  HCT 33.4*  PLT 65*    Recent Labs  04/10/16 0549  NA 133*  K 3.8  CL 96*  GLUCOSE 111*  BUN 10  CREATININE 0.92  CALCIUM 9.2   CBG (last 3)   Recent Labs  04/09/16 1632 04/09/16 2131 04/10/16 0633  GLUCAP 117* 134* 126*    Wt Readings from Last 3 Encounters:  04/09/16 130.772 kg (288 lb 4.8 oz)  03/27/16 138.71 kg (305 lb 12.8 oz)  03/20/16 129 kg (284 lb 6.3 oz)    Physical Exam:  Constitutional: She appears well-developed and well-nourished. No distress. Morbidly obese  HENT: Normocephalic and atraumatic.  Eyes: Conjunctivae and Conj are normal.   Cardiovascular: Tachycardia. Regular rhythm. No murmur heard. Respiratory: Effort normal. No stridor. She has no wheezes.  GI: Soft. Bowel sounds are normal. There is no tenderness.  Musculoskeletal: + TTP left chest wall under the breast. She exhibits edema. Trace to 1+ pretibial edema  Neurological: She is alert and oriented.  B/l UE strength 4+/5.  RLE: 4-/5 proximally, 4+/5 distally LLE: 4/5 proximally, 4+/5 distally Skin: Skin is warm and dry. She is not diaphoretic.  Lower back incision c/d/i. Psychiatric: Anxious. Her speech is normal. Judgment and thought content normal. Cognition and memory are normal.   Assessment/Plan: 1. Functional and mobility deficits  secondary to lumbar spondylolisthesis/radiculopathy which require 3+ hours per day of interdisciplinary therapy in a comprehensive inpatient rehab setting. Physiatrist is providing close team supervision and 24 hour management of active medical problems listed below. Physiatrist and rehab team continue to assess barriers to discharge/monitor patient progress toward functional and medical goals.  Function:  Bathing Bathing position   Position: Shower  Bathing parts Body parts bathed by patient: Right arm, Left arm, Chest, Abdomen, Front perineal area, Buttocks, Right upper leg, Left upper leg, Right lower leg, Left lower leg, Back Body parts bathed by helper: Buttocks  Bathing assist Assist Level: Touching or steadying assistance(Pt > 75%)      Upper Body Dressing/Undressing Upper body dressing   What is the patient wearing?: Pull over shirt/dress, Orthosis, Bra Bra - Perfomed by patient: Thread/unthread right bra strap, Thread/unthread left bra strap, Hook/unhook bra (pull down sports bra)   Pull over shirt/dress - Perfomed by patient: Thread/unthread right sleeve, Thread/unthread left sleeve, Put head through opening, Pull shirt over trunk       Orthosis activity level: Performed by patient  Upper body assist Assist Level: Set up   Set up : To obtain clothing/put away  Lower Body Dressing/Undressing Lower body dressing   What is the patient wearing?: Underwear, Pants, Non-skid slipper socks, Ted Hose Underwear - Performed by patient: Thread/unthread right underwear leg, Thread/unthread left underwear leg, Pull underwear up/down   Pants- Performed by patient: Thread/unthread right pants leg, Thread/unthread left pants leg, Pull pants up/down Pants- Performed by helper: Pull pants up/down Non-skid slipper socks- Performed by patient: Don/doff right sock, Don/doff left sock Non-skid slipper socks- Performed by helper: Don/doff right sock, Don/doff left sock (secondary to time  constraints)               TED Hose - Performed by helper: Don/doff right TED hose, Don/doff left TED hose  Lower body assist Assist for lower body dressing: Touching or steadying assistance (Pt > 75%)      Toileting Toileting   Toileting steps completed by patient: Adjust clothing prior to toileting (PT wearing gown) Toileting steps completed by helper: Performs perineal hygiene Toileting Assistive Devices: Other (comment) (Walker in front of pt)  Toileting assist Assist level: Touching or steadying assistance (Pt.75%)   Transfers Chair/bed transfer   Chair/bed transfer method: Ambulatory Chair/bed transfer assist level: Supervision or verbal cues Chair/bed transfer assistive device: Medical sales representative     Max distance: 180 ft  Assist level: Touching or steadying assistance (Pt > 75%)   Wheelchair   Type: Manual Max wheelchair distance: 159ft Assist Level: Supervision or verbal cues  Cognition Comprehension Comprehension assist level: Follows complex conversation/direction with no assist  Expression Expression assist level: Expresses complex ideas: With no assist  Social Interaction Social Interaction assist level: Interacts appropriately with others - No medications needed.  Problem Solving Problem solving assist level: Solves complex problems: Recognizes & self-corrects  Memory Memory assist level: Complete Independence: No helper   Medical Problem List and Plan: 1. Functional and mobility deficits secondary to lumbar spondlylolisthesis and radiculopathy  -Cont CIR  -Pt on bed rest for PE 2. DVT Prophylaxis/Anticoagulation: Mechanical:  Sequential compression devices, below knee Bilateral lower extremities   CTA ordered on 6/19 for chest pain, reviewed, suggesting PE.  Xarelto started on 6/19 d/ced on 6/20 ?thrombocytopenia.  Repeat labs ordered.  If reliable value, will consult Heme/Onc for recs.   3. Pain Management: Changed hydrocodone (causes  palpitations) to oxycodone prn. Scheduled tramadol for better pain control.   -robaxin helpful for pain-  750mg  qid scheduled  -utilizing ice/heat also  -Gabapentin increased to 600 3 times a day at 6/13   -OxyContin 10 mg twice a day started on 6/14---wean prior to discharge  -Will add lidoderm patch on 6/20 4. Mood: Team to provide ego support. LCSW to follow for evaluation and support.   Klonipin 0.25 BID PRN started on 6/20 5. Neuropsych: This patient is capable of making decisions on her own behalf. 6. Skin/Wound Care: Monitor wound daily for healing. Maintain adequate nutritional and hydration status.  7. Fluids/Electrolytes/Nutrition: Cont I/Os  Mild hyponatremia: 133 on 6/20.   8. T2DM--diet controlled: tight control at present  9. HTN: Monitor BP. On aldactone/HCTZ.  Cozaar--continue to hold. Encourage fluid intake.  10 ABLA:  Added iron supplement.   Hemoglobin 10.6 on 6/20  11. Leucocytosis:   No fevers or other signs of infection.   WBCs 12.0 on 6/20  UA/Ucx ordered  Will continue to monitor 12. Constipation:  Increased bowel meds on 6/12, with results 13. Left chest pain  CXR 6/19 reviewed with vascular congestion  ECG reviewed, sinus tach  CTA suggesting PE  LOS (Days) 11 A FACE TO FACE EVALUATION WAS PERFORMED  Ankit [name] 04/10/2016 8:52 AM

## 2016-04-10 NOTE — Progress Notes (Signed)
Social Work Patient ID: Sheri Baker, female   DOB: 01-31-1951, 65 y.o.   MRN: [id number] Team feels pt will not be ready for discharge on Thursday due to tow days of bedrest and needs to be mod/i level. Will re-eval once bedrest order lifted and see how well she does. Will discuss in team conference tomorrow.

## 2016-04-11 ENCOUNTER — Inpatient Hospital Stay (HOSPITAL_COMMUNITY): Payer: BLUE CROSS/BLUE SHIELD | Admitting: Occupational Therapy

## 2016-04-11 ENCOUNTER — Inpatient Hospital Stay (HOSPITAL_COMMUNITY): Payer: BLUE CROSS/BLUE SHIELD | Admitting: Physical Therapy

## 2016-04-11 DIAGNOSIS — D696 Thrombocytopenia, unspecified: Secondary | ICD-10-CM

## 2016-04-11 LAB — CBC
HCT: 31.2 % — ABNORMAL LOW (ref 36.0–46.0)
Hemoglobin: 9.8 g/dL — ABNORMAL LOW (ref 12.0–15.0)
MCH: 26.7 pg (ref 26.0–34.0)
MCHC: 31.4 g/dL (ref 30.0–36.0)
MCV: 85 fL (ref 78.0–100.0)
Platelets: 95 10*3/uL — ABNORMAL LOW (ref 150–400)
RBC: 3.67 MIL/uL — ABNORMAL LOW (ref 3.87–5.11)
RDW: 13.4 % (ref 11.5–15.5)
WBC: 9.7 10*3/uL (ref 4.0–10.5)

## 2016-04-11 LAB — URINE CULTURE

## 2016-04-11 LAB — GLUCOSE, CAPILLARY
Glucose-Capillary: 110 mg/dL — ABNORMAL HIGH (ref 65–99)
Glucose-Capillary: 113 mg/dL — ABNORMAL HIGH (ref 65–99)
Glucose-Capillary: 100 mg/dL — ABNORMAL HIGH (ref 65–99)
Glucose-Capillary: 117 mg/dL — ABNORMAL HIGH (ref 65–99)

## 2016-04-11 NOTE — Progress Notes (Signed)
Physical Therapy Session Note  Patient Details  Name: Sheri Baker MRN: [id number] Date of Birth: February 15, 1951  Today's Date: 04/11/2016 PT Individual Time: 0903-1000 PT Individual Time Calculation (min): 57 min   Short Term Goals: Week 1:  PT Short Term Goal 1 (Week 1): STG=LTG due to ELOS  Skilled Therapeutic Interventions/Progress Updates:    Patient received supine in bed and agreeable to PT. Per PA, patietn has been cleared for OOB activity. Patient performed supine>sit with log roll technique.  Back brace applied in sitting position.  Stand pivot transfer with min A and RW. Min cues for UE placement and proper posture.   Gait training for 63ft, 44ft, 125ft with RW. PT provided supervision A with min cues for step through gait pattern and improved pursed lip breathing.   WC mobility for 161ft x 2 with supervision A with min cues for doorway management.  PT fit patient for ELR to decrease radicular pain in LLE but reports liking standard leg rests more than ELR  Patient returned to room and left sitting in [facility] with call bell within reach.        Therapy Documentation Precautions:  Precautions Precautions: Back Precaution Booklet Issued: Yes (comment) Precaution Comments: Able to recall 3/3 Required Braces or Orthoses: Spinal Brace Spinal Brace: Lumbar corset Restrictions Weight Bearing Restrictions: No   Pain: Pain Assessment Pain Score: 5   See Function Navigator for Current Functional Status.   Therapy/Group: Individual Therapy  [name] 04/11/2016, 12:50 PM

## 2016-04-11 NOTE — Progress Notes (Signed)
Physical Therapy Session Note  Patient Details  Name: Sheri Baker MRN: [id number] Date of Birth: April 18, 1951  Today's Date: 04/11/2016 PT Individual Time: 1303-1403 PT Individual Time Calculation (min): 60 min   Short Term Goals: Week 1:  PT Short Term Goal 1 (Week 1): STG=LTG due to ELOS  Skilled Therapeutic Interventions/Progress Updates:    Pt received in w/c & agreeable to PT, noting 5/10 pain in L ribcage & buttocks but notes having a pain patch on. W/c mobility x 180 ft to gym with supervision & pt on 1L/min. Pt's SpO2 = 96-98% on 1L/min supplemental oxygen via nasal cannula & oxygen removed. Thoroughly educated pt on & demonstrated to pt, pursed lip breathing with pt return demonstrating; emphasized importance of breathing in this manner during activity. Stand pivot w/c>nu-step and nu-step utilized on level 4 x 12 minutes with pt reporting 13 on Borg RPE scale & SpO2 ranging from 88-90% during activity on room air.  Pt coughed & produced red-tinged sputum, this PT notified RN. Stair training x 4 steps (6")  With L ascending rail & steady A. Pt reports brother has installed a ramp at home so she will not have to negotiate stairs to enter house. Gait training x 180 ft gym>room with RW & supervision. At end of session pt left on room air (RN aware) with SpO2 = 93% & pt left with all needs within reach.   Therapy Documentation Precautions:  Precautions Precautions: Back Precaution Booklet Issued: Yes (comment) Precaution Comments: Able to recall 3/3 Required Braces or Orthoses: Spinal Brace Spinal Brace: Lumbar corset Restrictions Weight Bearing Restrictions: No  Pain: Pain Assessment Pain Assessment: 0-10 Pain Score: 5  Pain Location:  (L ribcage & buttocks) Pain Intervention(s): Repositioned   See Function Navigator for Current Functional Status.   Therapy/Group: Individual Therapy  [name] 04/11/2016, 1:00 PM

## 2016-04-11 NOTE — Progress Notes (Signed)
Social Work Patient ID: Sheri Baker, female   DOB: 07/08/1951, 64 y.o.   MRN: [id number] Met with pt to discuss team conference now off bed rest, will need until next Wed to reach supervision-mod/i level goals. Team also weaning off of her O2. Pt reports pain is much better with the pain patch, but it does make her sleepy. Brother has had cousin build a ramp into pt's home. Will talk with her sister to set up Education next week prior to discharge. 

## 2016-04-11 NOTE — Patient Care Conference (Signed)
Inpatient RehabilitationTeam Conference and Plan of Care Update Date: 04/11/2016   Time: 2:45 PM    Patient Name: Sheri Baker      Medical Record Number: [id number]  Date of Birth: [birth year] Sex: Female         Room/Bed: 4W24C/4W24C-01 Payor Info: Payor: [place] / Plan: [employer] OTHER / Product Type: *No Product type* /    Admitting Diagnosis: Lumber Decompression Admit  Admit Date/Time:  03/30/2016  5:24 PM Admission Comments: No comment available   Primary Diagnosis:  Spinal stenosis of lumbar region with radiculopathy Principal Problem: Spinal stenosis of lumbar region with radiculopathy  Patient Active Problem List   Diagnosis Date Noted  . Thrombocytopenia ([place])   . Pulmonary embolism without acute cor pulmonale ([place])   . Anxiety state   . Chest pain   . Pain in rib   . Post-operative pain   . Neuropathic pain of both legs   . Leukocytosis   . Hyponatremia   . Acute blood loss anemia   . Benign essential HTN   . Slow transit constipation   . Diabetes mellitus type 2 in obese ([place])   . Spinal stenosis of lumbar region with radiculopathy 03/30/2016  . Spondylolisthesis of lumbar region 03/27/2016    Expected Discharge Date: Expected Discharge Date: 04/18/16  Team Members Present: Physician leading conference: Dr. Delice Lesch Social Worker Present: Ovidio Kin, LCSW Nurse Present: Heather Roberts, RN PT Present: Barrie Folk, PT;Victoria Sabra Heck, PT OT Present: Willeen Cass, OT SLP Present: Gunnar Fusi, SLP PPS Coordinator present : Daiva Nakayama, RN, CRRN     Current Status/Progress Goal Weekly Team Focus  Medical   Functional and mobility deficits secondary to lumbar spondlylolisthesis and radiculopathy and obesity, now with PE  Improve pain, mobility, safety, transfers  see above   Bowel/Bladder   Continent of Bowel/Bladde; LBM 6/19  Remain continent of Boewl/Bladde with nim assist  Offer toiletin q2hrs and as needed   Swallow/Nutrition/  Hydration             ADL's   Supervision for UB/LB bathing while seated in shower; setup for UB dressing, Supervision for LB dressing with use of AE. Min A for toilet and tub bench shower transfers.   UB/LB Bathing Mod I, UB/LB dressing Mod I, Toileting and toilet transfer Mod I, laundry/meal prep/ housekeeping Mod I  improving balance and increasing focus on IADL goals    Mobility   pt able to ambulate up to 180 ft with RW & supervision, supervision for bed mobility with use of pillows, supervision/steady A for transfers, negotiates 4 steps with L rail & steady A, significant back/buttocks/leg pain  supervision for ambulation with LRAD, supervision for stair negotiation, supervision for standing balance  pt education, stair training, increasing safety awareness, endurance training, gait training   Communication             Safety/Cognition/ Behavioral Observations            Pain   Complain of pain of 10 to Rib cage Oxy 10 mg Tramadol 50 mg schedule given; Positive for PE  <3  assess and treate pain q4hrs and as needed   Skin   Incision to lower back; skin glue and OTA  Patient skin to be free form new skin breakdown/infection  Assess skin q shift and as needed      *See Care Plan and progress notes for long and short-term goals.  Barriers to Discharge: Nociceptive and Neuropathic pain, ABLA,  hyponatremia, thrombocytopenia, Pain    Possible Resolutions to Barriers:  Follow labs, optimize pain meds, anticoagulation    Discharge Planning/Teaching Needs:  Sister still going to stay with pt for 1-2 weeks at discharge. Pt has been on bedrest last two days due to PE, see if is back to same levels when able to return to therapies.      Team Discussion:  Pt on bed rest for two days after PE diagnosed. Pain much now on pain patch. Was active in therapies today reports being tired and fatigued. Will need until 6/28 to reach goals of supervision to mod/i level. Weaning off O2. Consulted hemo  due to platlets monitoring.  Revisions to Treatment Plan:  DC now 6/28   Continued Need for Acute Rehabilitation Level of Care: The patient requires daily medical management by a physician with specialized training in physical medicine and rehabilitation for the following conditions: Daily direction of a multidisciplinary physical rehabilitation program to ensure safe treatment while eliciting the highest outcome that is of practical value to the patient.: Yes Daily medical management of patient stability for increased activity during participation in an intensive rehabilitation regime.: Yes Daily analysis of laboratory values and/or radiology reports with any subsequent need for medication adjustment of medical intervention for : Post surgical problems;Neurological problems;Other;Mood/behavior problems;Pulmonary problems  [name] 04/11/2016, 3:11 PM

## 2016-04-11 NOTE — Progress Notes (Signed)
[facility] PHYSICAL MEDICINE & REHABILITATION     PROGRESS NOTE    Subjective/Complaints: Patient seen lying in bed this morning. She states she is a little more comfortable. Her pain is improved.  ROS: + Left chest wall pain. Denies SOB, N/V/D.  Objective: Vital Signs: Blood pressure 135/86, pulse 112, temperature 98.7 F (37.1 C), temperature source Oral, resp. rate 20, height 5\' 5"  (1.651 m), weight 130.772 kg (288 lb 4.8 oz), SpO2 94 %. No results found.  Recent Labs  04/10/16 1113 04/11/16 0720  WBC 11.6* 9.7  HGB 9.9* 9.8*  HCT 31.5* 31.2*  PLT 72* 95*    Recent Labs  04/10/16 0549  NA 133*  K 3.8  CL 96*  GLUCOSE 111*  BUN 10  CREATININE 0.92  CALCIUM 9.2   CBG (last 3)   Recent Labs  04/10/16 1636 04/10/16 2113 04/11/16 0701  GLUCAP 119* 127* 110*    Wt Readings from Last 3 Encounters:  04/09/16 130.772 kg (288 lb 4.8 oz)  03/27/16 138.71 kg (305 lb 12.8 oz)  03/20/16 129 kg (284 lb 6.3 oz)    Physical Exam:  Constitutional: She appears well-developed and well-nourished. No distress. Morbidly obese  HENT: Normocephalic and atraumatic.  Eyes: Conjunctivae and Conj are normal.   Cardiovascular: Tachycardia. Regular rhythm. No murmur heard. Respiratory: Effort normal. No stridor. She has no wheezes.  GI: Soft. Bowel sounds are normal. There is no tenderness.  Musculoskeletal: + TTP left chest wall under the breast. She exhibits edema. Trace to 1+ pretibial edema  Neurological: She is alert and oriented.  B/l UE strength 4+/5.  RLE: 4-/5 proximally, 4+/5 distally LLE: 4/5 proximally, 4+/5 distally Skin: Skin is warm and dry. She is not diaphoretic.  Lower back incision c/d/i. Psychiatric: Anxious. Her speech is normal. Judgment and thought content normal. Cognition and memory are normal.   Assessment/Plan: 1. Functional and mobility deficits secondary to lumbar spondylolisthesis/radiculopathy which require 3+ hours per day of  interdisciplinary therapy in a comprehensive inpatient rehab setting. Physiatrist is providing close team supervision and 24 hour management of active medical problems listed below. Physiatrist and rehab team continue to assess barriers to discharge/monitor patient progress toward functional and medical goals.  Function:  Bathing Bathing position   Position: Shower  Bathing parts Body parts bathed by patient: Right arm, Left arm, Chest, Abdomen, Front perineal area, Buttocks, Right upper leg, Left upper leg, Right lower leg, Left lower leg, Back Body parts bathed by helper: Buttocks  Bathing assist Assist Level: Touching or steadying assistance(Pt > 75%)      Upper Body Dressing/Undressing Upper body dressing   What is the patient wearing?: Pull over shirt/dress, Orthosis, Bra Bra - Perfomed by patient: Thread/unthread right bra strap, Thread/unthread left bra strap, Hook/unhook bra (pull down sports bra)   Pull over shirt/dress - Perfomed by patient: Thread/unthread right sleeve, Thread/unthread left sleeve, Put head through opening, Pull shirt over trunk       Orthosis activity level: Performed by patient  Upper body assist Assist Level: Set up   Set up : To obtain clothing/put away  Lower Body Dressing/Undressing Lower body dressing   What is the patient wearing?: Underwear, Pants, Non-skid slipper socks, Ted Hose Underwear - Performed by patient: Thread/unthread right underwear leg, Thread/unthread left underwear leg, Pull underwear up/down   Pants- Performed by patient: Thread/unthread right pants leg, Thread/unthread left pants leg, Pull pants up/down Pants- Performed by helper: Pull pants up/down Non-skid slipper socks- Performed by patient: Don/doff  right sock, Don/doff left sock Non-skid slipper socks- Performed by helper: Don/doff right sock, Don/doff left sock (secondary to time constraints)               TED Hose - Performed by helper: Don/doff right TED hose,  Don/doff left TED hose  Lower body assist Assist for lower body dressing: Touching or steadying assistance (Pt > 75%)      Toileting Toileting   Toileting steps completed by patient: Adjust clothing prior to toileting, Performs perineal hygiene, Adjust clothing after toileting Toileting steps completed by helper: Performs perineal hygiene Toileting Assistive Devices: Other (comment) (Walker in front of pt)  Toileting assist Assist level: Touching or steadying assistance (Pt.75%)   Transfers Chair/bed transfer   Chair/bed transfer method: Stand pivot Chair/bed transfer assist level: Touching or steadying assistance (Pt > 75%) Chair/bed transfer assistive device: Medical sales representative     Max distance: 180 ft  Assist level: Touching or steadying assistance (Pt > 75%)   Wheelchair   Type: Manual Max wheelchair distance: 174ft Assist Level: Supervision or verbal cues  Cognition Comprehension Comprehension assist level: Follows complex conversation/direction with no assist  Expression Expression assist level: Expresses complex ideas: With no assist  Social Interaction Social Interaction assist level: Interacts appropriately with others - No medications needed.  Problem Solving Problem solving assist level: Solves complex problems: Recognizes & self-corrects  Memory Memory assist level: Complete Independence: No helper   Medical Problem List and Plan: 1. Functional and mobility deficits secondary to lumbar spondlylolisthesis and radiculopathy  -Resume CIR 2. DVT Prophylaxis/Anticoagulation: Mechanical: Sequential compression devices, below knee Bilateral lower extremities   CTA ordered on 6/19 for chest pain, reviewed, suggesting PE.  Xarelto started on 6/19. Temporarily DC'd, however after conversation with heme/onc resumed.  Will continue to monitor platelets 3. Pain Management: Changed hydrocodone (causes palpitations) to oxycodone prn. Scheduled tramadol for  better pain control.   -robaxin helpful for pain-  750mg  qid scheduled  -utilizing ice/heat also  -Gabapentin increased to 600 3 times a day at 6/13   -OxyContin 10 mg twice a day started on 6/14---wean prior to discharge  -Added lidoderm patch on 6/20 4. Mood: Team to provide ego support. LCSW to follow for evaluation and support.   Klonipin 0.25 BID PRN started on 6/20 5. Neuropsych: This patient is capable of making decisions on her own behalf. 6. Skin/Wound Care: Monitor wound daily for healing. Maintain adequate nutritional and hydration status.  7. Fluids/Electrolytes/Nutrition: Cont I/Os  Mild hyponatremia: 133 on 6/20.   8. T2DM--diet controlled: tight control at present  9. HTN: Monitor BP. On aldactone/HCTZ.  Cozaar--continue to hold. Encourage fluid intake.  10 ABLA:  Added iron supplement.   Hemoglobin 9.8 on 6/21  11. Leucocytosis: Resolved  No fevers or other signs of infection.   WBCs 9.7 on 6/21  UA with few bacteria, U culture with multiple species, reordered  Will continue to monitor 12. Constipation:  Increased bowel meds on 6/12, with results 13. PE  CXR 6/19 reviewed with vascular congestion  ECG reviewed, sinus tach, repeat ECG results pending  CTA suggesting PE 14. Thrombocytopenia  Secondary to Xarelto  Platelets 95 on 6/21 (improving)  LOS (Days) 12 A FACE TO FACE EVALUATION WAS PERFORMED  Ankit [name] 04/11/2016 11:14 AM

## 2016-04-11 NOTE — Plan of Care (Signed)
Problem: RH PAIN MANAGEMENT Goal: RH STG PAIN MANAGED AT OR BELOW PT'S PAIN GOAL Less than 3,on 1 to 10 scale  Outcome: Not Progressing Patient continue to complain of pain of 10 on the scale 0-10

## 2016-04-11 NOTE — Progress Notes (Signed)
Occupational Therapy Note  Patient Details  Name: Jancy Krolak MRN: [id number] Date of Birth: 1951-02-07  Today's Date: 04/11/2016 OT Missed Time:  60 min on 04/19/16 Missed Time Reason:   due to bed rest due to DVT.  Will resume therapy as MD orders.  (late entry for 04/10/16)  Willeen Cass [facility] 04/11/2016, 7:18 AM

## 2016-04-11 NOTE — Progress Notes (Signed)
Nurse called to pt's room to assess a scant amount of red tinged sputum in that pt had expelled x 1@ 1045, and a smaller amount of red tinged sputum expelled by pt during therapy around 1400. Pt stated that this was the first time that it had occurred, and was questioning if it was the side effect of the medication, and/or if the clot was breaking up. Spoke to Dr. Posey Pronto, no new orders at this time. Staff to continue to observe. No additional red tinged sputum expelled per 1830 discussion with patient.

## 2016-04-11 NOTE — Progress Notes (Signed)
Occupational Therapy Session Note  Patient Details  Name: Sheri Baker MRN: [id number] Date of Birth: 08/29/1951  Today's Date: 04/11/2016 OT Individual Time: [id number] OT Individual Time Calculation (min): 30 min   Short Term Goals: Week 1:  OT Short Term Goal 1 (Week 1): Bathe seated on tub bench using AE prn with min assist for thoroughness OT Short Term Goal 2 (Week 1): Dress lower body, sitting and standing using AE prn to maintian back precautions OT Short Term Goal 3 (Week 1): Complete light meal prep using LRAD and AE prn with instructional cues OT Short Term Goal 4 (Week 1): Maintain back precautions 100% of the time during performance of BADL for 1 hour with min vc  Skilled Therapeutic Interventions/Progress Updates:  Upon entering room, pt found seated in w/c with complaints of being sleepy and needing to use the BR. Pt stood with RW and ambulated into BR with steady assist. Pt performed toileting needs of clothing management and peri cleansing in sit to/from stand position using toileting aide. Pt with increased pain during toileting, 6/10 spasms around rib cage area - pt states RN gave medication for this and therapist monitored during session while encouraging pursed lip breathing. Pt stood and ambulated out of BR and stood at sink for grooming task of washing hands. Discussed ambulating with RW and items, pt problem solved on how to drape clothes over RW for safety and to transport items. Pt then worked on doffing lumbar corset, shirt, underwear, pants, and TEDs in sitting position EOB. 02 sats ranged from 85% - 90% while patient on RA, encouraged pursed lip breathing and seated rest break when sats lower than 90%. Pt laid in supine and therapist left pt supine in bed with all needs within reach.   Therapy Documentation Precautions:  Precautions Precautions: Back Precaution Booklet Issued: Yes (comment) Precaution Comments: Able to recall 3/3 Required Braces or Orthoses: Spinal  Brace Spinal Brace: Lumbar corset Restrictions Weight Bearing Restrictions: No  Vital Signs: Therapy Vitals Temp: 98.7 F (37.1 C) Temp Source: Oral Pulse Rate: (!) 112 Resp: 20 BP: 135/86 mmHg Patient Position (if appropriate): Lying Oxygen Therapy SpO2: 94 % O2 Device: Nasal Cannula O2 Flow Rate (L/min): 2 L/min  See Function Navigator for Current Functional Status.  Therapy/Group: Individual Therapy  Chrys Racer , MS, OTR/L, CLT  04/11/2016, 4:01 PM

## 2016-04-11 NOTE — Progress Notes (Signed)
Occupational Therapy Session Note  Patient Details  Name: Sheri Baker MRN: [id number] Date of Birth: May 22, 1951  Today's Date: 04/11/2016 OT Individual Time:  - 24 minutes (1008-1100)   Missed Time: 8 minutes   Skilled Therapeutic Interventions/Progress Updates:    Pt participated in tx focusing on decreasing LOA for bathing tasks. Pt now completes UB/LB bathing with supervision for min instruction on use of AE. Pt does well adhering to back precautions still while bathing. Pt completed UB/LB dressing standing prn at sink with supervision for standing balance and safe use of AE. Pt still requires steadying assistance for shower transfers and benefits from balance remediation training during tx outside of ADL completion. 02 sats monitored during tx with pt on 1L of 02 upon skilled OT arrival. Pt 02 sats on room air wavered between 88-95% during shower with pt instructed on diaphragmatic breathing to increase 02 sats. Pt placed on 1L of 02 at end of session, all needs within reach. Pt reported feeling much better compared to yesterday. Nursing reported bed rest orders were cleared today.   Therapy Documentation Precautions:  Precautions Precautions: Back Precaution Booklet Issued: Yes (comment) Precaution Comments: Able to recall 3/3 Required Braces or Orthoses: Spinal Brace Spinal Brace: Lumbar corset Restrictions Weight Bearing Restrictions: No  Pain: Pain Assessment Pain Assessment: 0-10 Pain Score: 5  Pain Location:  (L ribcage & buttocks) Pain Intervention(s): Repositioned :    See Function Navigator for Current Functional Status.   Therapy/Group: Individual Therapy  Michaela A Hoffman 04/11/2016, 2:07 PM

## 2016-04-12 ENCOUNTER — Inpatient Hospital Stay (HOSPITAL_COMMUNITY): Payer: BLUE CROSS/BLUE SHIELD | Admitting: Physical Therapy

## 2016-04-12 ENCOUNTER — Inpatient Hospital Stay (HOSPITAL_COMMUNITY): Payer: BLUE CROSS/BLUE SHIELD | Admitting: Occupational Therapy

## 2016-04-12 DIAGNOSIS — R252 Cramp and spasm: Secondary | ICD-10-CM

## 2016-04-12 DIAGNOSIS — R52 Pain, unspecified: Secondary | ICD-10-CM

## 2016-04-12 LAB — GLUCOSE, CAPILLARY
Glucose-Capillary: 118 mg/dL — ABNORMAL HIGH (ref 65–99)
Glucose-Capillary: 99 mg/dL (ref 65–99)
Glucose-Capillary: 111 mg/dL — ABNORMAL HIGH (ref 65–99)
Glucose-Capillary: 126 mg/dL — ABNORMAL HIGH (ref 65–99)

## 2016-04-12 MED ORDER — GABAPENTIN 300 MG PO CAPS
900.0000 mg | ORAL_CAPSULE | Freq: Three times a day (TID) | ORAL | Status: DC
  Administered 2016-04-12 – 2016-04-18 (×18): 900 mg via ORAL
  Filled 2016-04-12 (×18): qty 3

## 2016-04-12 MED ORDER — TIZANIDINE HCL 2 MG PO TABS
2.0000 mg | ORAL_TABLET | Freq: Four times a day (QID) | ORAL | Status: DC | PRN
  Administered 2016-04-13 – 2016-04-16 (×6): 2 mg via ORAL
  Filled 2016-04-12 (×6): qty 1

## 2016-04-12 NOTE — Progress Notes (Signed)
Physical Therapy Session Note  Patient Details  Name: Sheri Baker MRN: [id number] Date of Birth: 1950/10/27  Today's Date: 04/12/2016 PT Individual Time: 0900-1000 PT Individual Time Calculation (min): 60 min   Short Term Goals: Week 1:  PT Short Term Goal 1 (Week 1): STG=LTG due to ELOS  Skilled Therapeutic Interventions/Progress Updates:  Pt and MD in room upon entering.  Pt with c/o increased pain in RLE which pt states pain meds are not helping.  MD aware and states will review and modify. Pt stating in too much pain to ambulate at moment, however agreeable to perform supine core stabilization activities/therex.  Pt educated in TA activation and performed following activities with focus on TA contractions, alternating shoulder flexion, shoulder horizontal abd with orange resistance band, pelvic tilt to neutral, SAQ, and heel slides. Pt encouraged to sit in chair which pt was agreeable to towards end of session.  Pt encouraged to perform ab bracing with bed mobility and transfer to chair which pt performed with CGA and RW. Pt educated in relaxation/breathing techniques for pain management which pt stated did help.  Nsg in room at end of session to administer pain meds. Pt left in room with nsg and all current needs met.      Therapy Documentation Precautions:  Precautions Precautions: Back Precaution Booklet Issued: Yes (comment) Precaution Comments: Able to recall 3/3 Required Braces or Orthoses: Spinal Brace Spinal Brace: Lumbar corset Restrictions Weight Bearing Restrictions: No General:   Vital Signs: Therapy Vitals Temp: 98.4 F (36.9 C) Temp Source: Oral Pulse Rate: (!) 114 Resp: 20 Oxygen Therapy SpO2: 94 % O2 Device: Not Delivered Pain:     See Function Navigator for Current Functional Status.   Therapy/Group: Individual Therapy  Rosita DeChalus  Rosita DeChalus, PTA  04/12/2016, 9:08 AM

## 2016-04-12 NOTE — Progress Notes (Signed)
Patient reported better day. Tolerated therapy but am OT therapy reported patient confused and forgetful. Patient assessed and able to verbalize needs without difficulty. Patient noted this pm being very sleepy. 1800 ultram and robaxin held due to patient fatigue. Patient given previous scheduled medications with Oxycodone 10 mg po given every 4-6 hours. Patient noted with increase in Neurontin and noted to be sleepy. Continue to educate patient on medications and when to take prn pain medication. Patient did wake up with RN request to eat pm meal. No complaint of pain at this time.  Mliss Sax

## 2016-04-12 NOTE — Progress Notes (Signed)
[facility] PHYSICAL MEDICINE & REHABILITATION     PROGRESS NOTE    Subjective/Complaints: Patient seen lying in bed this morning. She denies further episodes of blood tinged sputum. She does note that she was having difficulty sleeping overnight due to cramping pain in her legs and  ROS: + Bilateral lower extremity cramping. Denies SOB, N/V/D.  Objective: Vital Signs: Blood pressure 157/71, pulse 114, temperature 98.4 F (36.9 C), temperature source Oral, resp. rate 20, height 5\' 5"  (1.651 m), weight 130.772 kg (288 lb 4.8 oz), SpO2 94 %. No results found.  Recent Labs  04/10/16 1113 04/11/16 0720  WBC 11.6* 9.7  HGB 9.9* 9.8*  HCT 31.5* 31.2*  PLT 72* 95*    Recent Labs  04/10/16 0549  NA 133*  K 3.8  CL 96*  GLUCOSE 111*  BUN 10  CREATININE 0.92  CALCIUM 9.2   CBG (last 3)   Recent Labs  04/11/16 1630 04/11/16 2058 04/12/16 0653  GLUCAP 100* 117* 118*    Wt Readings from Last 3 Encounters:  04/09/16 130.772 kg (288 lb 4.8 oz)  03/27/16 138.71 kg (305 lb 12.8 oz)  03/20/16 129 kg (284 lb 6.3 oz)    Physical Exam:  Constitutional: She appears well-developed and well-nourished. No distress. Morbidly obese  HENT: Normocephalic and atraumatic.  Eyes: Conjunctivae and Conj are normal.   Cardiovascular: Tachycardia. Regular rhythm. No murmur heard. Respiratory: Effort normal. No stridor. She has no wheezes.  GI: Soft. Bowel sounds are normal. There is no tenderness.  Musculoskeletal: + TTP left chest wall under the breast. She exhibits edema. Trace to 1+ pretibial edema  Neurological: She is alert and oriented.  B/l UE strength 4+/5.  RLE: 4-/5 proximally, 4+/5 distally LLE: 4/5 proximally, 4+/5 distally Skin: Skin is warm and dry. She is not diaphoretic.  Lower back incision c/d/i. Psychiatric: Anxious. Her speech is normal. Judgment and thought content normal. Cognition and memory are normal.   Assessment/Plan: 1. Functional and mobility deficits  secondary to lumbar spondylolisthesis/radiculopathy which require 3+ hours per day of interdisciplinary therapy in a comprehensive inpatient rehab setting. Physiatrist is providing close team supervision and 24 hour management of active medical problems listed below. Physiatrist and rehab team continue to assess barriers to discharge/monitor patient progress toward functional and medical goals.  Function:  Bathing Bathing position   Position: Shower  Bathing parts Body parts bathed by patient: Right arm, Left arm, Chest, Abdomen, Front perineal area, Buttocks, Right upper leg, Left upper leg, Right lower leg, Left lower leg, Back Body parts bathed by helper: Buttocks  Bathing assist Assist Level: Supervision or verbal cues      Upper Body Dressing/Undressing Upper body dressing   What is the patient wearing?: Pull over shirt/dress Bra - Perfomed by patient: Thread/unthread right bra strap, Thread/unthread left bra strap, Hook/unhook bra (pull down sports bra)   Pull over shirt/dress - Perfomed by patient: Thread/unthread right sleeve, Thread/unthread left sleeve, Put head through opening, Pull shirt over trunk       Orthosis activity level: Performed by patient  Upper body assist Assist Level: Set up   Set up : To obtain clothing/put away  Lower Body Dressing/Undressing Lower body dressing   What is the patient wearing?: Underwear, Pants, Socks, [employer]  - Performed by patient: Thread/unthread left underwear leg, Thread/unthread right underwear leg, Pull underwear up/down   Pants- Performed by patient: Thread/unthread right pants leg, Thread/unthread left pants leg, Pull pants up/down Pants- Performed by helper: Pull pants  up/down Non-skid slipper socks- Performed by patient: Don/doff right sock, Don/doff left sock Non-skid slipper socks- Performed by helper: Don/doff right sock, Don/doff left sock (due to time constraints)               TED Hose - Performed by  helper: Don/doff right TED hose, Don/doff left TED hose  Lower body assist Assist for lower body dressing: Supervision or verbal cues      Toileting Toileting   Toileting steps completed by patient: Adjust clothing prior to toileting, Performs perineal hygiene, Adjust clothing after toileting Toileting steps completed by helper: Performs perineal hygiene Toileting Assistive Devices: Other (comment) (Walker in front of pt)  Toileting assist Assist level: Touching or steadying assistance (Pt.75%)   Transfers Chair/bed transfer   Chair/bed transfer method: Stand pivot Chair/bed transfer assist level: Touching or steadying assistance (Pt > 75%) Chair/bed transfer assistive device: Armrests, Medical sales representative     Max distance: 180 ft Assist level: Supervision or verbal cues   Wheelchair   Type: Manual Max wheelchair distance: 180 ft Assist Level: Supervision or verbal cues  Cognition Comprehension Comprehension assist level: Follows basic conversation/direction with no assist  Expression Expression assist level: Expresses basic needs/ideas: With extra time/assistive device  Social Interaction Social Interaction assist level: Interacts appropriately 90% of the time - Needs monitoring or encouragement for participation or interaction.  Problem Solving Problem solving assist level: Solves basic 90% of the time/requires cueing < 10% of the time  Memory Memory assist level: Recognizes or recalls 90% of the time/requires cueing < 10% of the time   Medical Problem List and Plan: 1. Functional and mobility deficits secondary to lumbar spondlylolisthesis and radiculopathy  -Continue CIR 2. DVT Prophylaxis/Anticoagulation: Mechanical: Sequential compression devices, below knee Bilateral lower extremities   CTA ordered on 6/19 for chest pain, reviewed, suggesting PE.  Xarelto started on 6/19. Temporarily DC'd, however after conversation with heme/onc resumed.  Will continue  to monitor platelets 3. Pain Management: Changed hydrocodone (causes palpitations) to oxycodone prn. Scheduled tramadol for better pain control.   -robaxin helpful for pain-  750mg  qid scheduled  -utilizing ice/heat also  -Gabapentin increased to 600 3 times a day at 6/13, increased to 900 on 6/22   -OxyContin 10 mg twice a day started on 6/14---wean prior to discharge  -Added lidoderm patch on 6/20  -Tizanidine 2 mg to 6 when necessary started on 6/22 4. Mood: Team to provide ego support. LCSW to follow for evaluation and support.   Klonipin 0.25 BID PRN started on 6/20, patient does not appear to be taking it frequently 5. Neuropsych: This patient is capable of making decisions on her own behalf. 6. Skin/Wound Care: Monitor wound daily for healing. Maintain adequate nutritional and hydration status.  7. Fluids/Electrolytes/Nutrition: Cont I/Os  Mild hyponatremia: 133 on 6/20.   8. T2DM--diet controlled: tight control at present  9. HTN: Monitor BP. On aldactone/HCTZ.  Cozaar--continue to hold. Encourage fluid intake.  10 ABLA:  Added iron supplement.   Hemoglobin 9.8 on 6/21  11. Leucocytosis: Resolved  No fevers or other signs of infection.   WBCs 9.7 on 6/21  UA with few bacteria, U culture with multiple species, reordered-pending  Will continue to monitor 12. Constipation:  Increased bowel meds on 6/12, with results 13. PE  CXR 6/19 reviewed with vascular congestion  ECG reviewed, sinus tach, repeat ECG results pending  CTA suggesting PE 14. Thrombocytopenia  Secondary to Xarelto  Platelets 95 on 6/21 (  improving)  LOS (Days) 13 A FACE TO FACE EVALUATION WAS PERFORMED  Ankit [name] 04/12/2016 8:47 AM

## 2016-04-12 NOTE — Progress Notes (Signed)
Physical Therapy Session Note  Patient Details  Name: Sheri Baker MRN: [id number] Date of Birth: 08-10-1951  Today's Date: 04/12/2016 PT Individual Time: 1312-1426 PT Individual Time Calculation (min): 74 min   Short Term Goals: Week 1:  PT Short Term Goal 1 (Week 1): STG=LTG due to ELOS   Skilled Therapeutic Interventions/Progress Updates:    Patient received supine in bed and agreeable to PT.  Patient performed log roll technique for supine to sit with minor use of bed features. Set up only required from PT.   Patient was able to don back brace with min A from PT for parts management.   Gait training for 129ft x2, 166ft, 148ft, and 189ft with RW with supervision A from PT with min cues for improve AD Management and increased use of pursed lip breathing to maintain SpO2 >90%.   PT instructed patient Stair training on 2 steps 6 inches each x 2 without rest break between bouts. Min A progressing to close supervision A provided by PT as well as mod cues for improved UE placement and decreased trunk rotation with descending steps.   Car transfer training with supervision A with min cues for UE placement for improved safety with sit>stand to exit car   Gait training on ramp for 15 ft to ascend and 64ft to descend with RW and supervision A from PT min-mod cues for proper AD management and to maintain erect posture throughout ramp training top prepare for entry into house. .   Patient retuned to room and left supine in bed with call bell within reach. Patient required supervision A for log roll technique into bed with min cues for positioning and proper use of bed features.     Therapy Documentation Precautions:  Precautions Precautions: Back Precaution Booklet Issued: Yes (comment) Precaution Comments: Able to recall 3/3 Required Braces or Orthoses: Spinal Brace Spinal Brace: Lumbar corset Restrictions Weight Bearing Restrictions: No Pain:    6/10 with activity. 4/10 at rest. RN  aware.    See Function Navigator for Current Functional Status.   Therapy/Group: Individual Therapy  [name] 04/12/2016, 5:41 PM

## 2016-04-12 NOTE — Progress Notes (Signed)
Occupational Therapy Session Note  Patient Details  Name: Sheri Baker MRN: [id number] Date of Birth: 29-Apr-1951  Today's Date: 04/12/2016 OT Individual Time: 1000-1100 OT Individual Time Calculation (min): 60 min   Skilled Therapeutic Interventions/Progress Updates:    Pt participated in tx focusing on pt completing AM ADLs with self setup utilizing adaptive technique and adhering to back precautions. Tx also incorporated questioning cues to prompt pt on self sequencing and problem solving during basic self care tasks. Today pt collected ADL items with RW and supervision for adaptive technique and instruction on proper placement near shower in home with verbalized understanding. Pt completed UB/LB bathing in shower with setup of wash cloth, towels, and AE prn. Pt able to adjust water temperature and adhere to back precautions without cuing. Pt attempted to stand x2 without orthosis and required increased instruction on sequencing during self care. UB dressing completed with supervision and LB dressing completed standing sinkside prn to hike pants. At beginning of tx pt exhibited 02 sats on room air 88-90%. 02 sats remained 90-94% during ADLs, though pt presented with increased diaphoresis on forehead. Pt left in w/c at end of tx with all needs within reach. Nursing notified on pt mental status as well as 02 sats and diaphoresis. Pt continues to benefit from balance remediation to further decrease LOA with basic self care. Pain manageable today with provided rest breaks.  Therapy Documentation Precautions:  Precautions Precautions: Back Precaution Booklet Issued: Yes (comment) Precaution Comments: Able to recall 3/3 Required Braces or Orthoses: Spinal Brace Spinal Brace: Lumbar corset Restrictions Weight Bearing Restrictions: No   See Function Navigator for Current Functional Status.   Therapy/Group: Individual Therapy  Michaela A Hoffman 04/12/2016, 3:17 PM

## 2016-04-12 NOTE — Progress Notes (Signed)
Occupational Therapy Session Note  Patient Details  Name: Sheri Baker MRN: [id number] Date of Birth: Feb 24, 1951  Today's Date: 04/12/2016 OT Individual Time: 0922-0952 OT Individual Time Calculation (min): 30 min    Short Term Goals: No STG set  Skilled Therapeutic Interventions/Progress Updates:    Pt seen for make up mins due to missed time when on bed rest. Pt received seated on toilet with RN present. Completed toileting tasks with supervision and ambulated into room with RW and contact guard. After short seated rest break, pt ambulated to closet with RW and contact guard to retrieve clothing in preparation for shower during next OT session. Grooming tasks completed in sitting due to fatigue. Discussed not having seating option for grooming tasks at home, but pt requesting to sit this session. Pt on room air throughout session, O2 sats dropping to 88% after short distance ambulation and HR ranged 116-123 but returned to 92% with cues for breathing technique.  Therapy Documentation Precautions:  Precautions Precautions: Back Precaution Booklet Issued: Yes (comment) Precaution Comments: Able to recall 3/3 Required Braces or Orthoses: Spinal Brace Spinal Brace: Lumbar corset Restrictions Weight Bearing Restrictions: No Pain: Pain Assessment Pain Assessment: 0-10 Pain Score: 9  Pain Type: Acute pain Pain Location: Leg Pain Orientation: Left Pain Intervention(s): Repositioned (premedicated) ADL: ADL ADL Comments: see Assessment Tool  See Function Navigator for Current Functional Status.   Therapy/Group: Individual Therapy  Simonne Come 04/12/2016, 10:18 AM

## 2016-04-12 NOTE — Progress Notes (Signed)
Social Work Patient ID: Sheri Baker, female   DOB: [birth year], 65 y.o.   MRN: [id number] Met with pt who did speak with her sister last night and she is planning on coming next Wed am and can do family education at 9:00 prior to discharge. She is going to [pt's brother's home in [place] and staying the night then coming in the am Wed. Pt is doing better today but had a bad painful night.  MD is till trying to adjust her pain medications so the pain is tolerable. Will work on discharge needs for Wed.

## 2016-04-13 ENCOUNTER — Inpatient Hospital Stay (HOSPITAL_COMMUNITY): Payer: BLUE CROSS/BLUE SHIELD | Admitting: Physical Therapy

## 2016-04-13 ENCOUNTER — Inpatient Hospital Stay (HOSPITAL_COMMUNITY): Payer: BLUE CROSS/BLUE SHIELD | Admitting: Occupational Therapy

## 2016-04-13 LAB — GLUCOSE, CAPILLARY
Glucose-Capillary: 111 mg/dL — ABNORMAL HIGH (ref 65–99)
Glucose-Capillary: 112 mg/dL — ABNORMAL HIGH (ref 65–99)
Glucose-Capillary: 125 mg/dL — ABNORMAL HIGH (ref 65–99)
Glucose-Capillary: 114 mg/dL — ABNORMAL HIGH (ref 65–99)

## 2016-04-13 LAB — CBC WITH DIFFERENTIAL/PLATELET
Basophils Absolute: 0 10*3/uL (ref 0.0–0.1)
Basophils Relative: 0 %
Eosinophils Absolute: 0.1 10*3/uL (ref 0.0–0.7)
Eosinophils Relative: 2 %
HCT: 30.8 % — ABNORMAL LOW (ref 36.0–46.0)
Hemoglobin: 9.7 g/dL — ABNORMAL LOW (ref 12.0–15.0)
Lymphocytes Relative: 23 %
Lymphs Abs: 1.7 10*3/uL (ref 0.7–4.0)
MCH: 26.8 pg (ref 26.0–34.0)
MCHC: 31.5 g/dL (ref 30.0–36.0)
MCV: 85.1 fL (ref 78.0–100.0)
Monocytes Absolute: 0.7 10*3/uL (ref 0.1–1.0)
Monocytes Relative: 10 %
Neutro Abs: 4.7 10*3/uL (ref 1.7–7.7)
Neutrophils Relative %: 65 %
Platelets: 132 10*3/uL — ABNORMAL LOW (ref 150–400)
RBC: 3.62 MIL/uL — ABNORMAL LOW (ref 3.87–5.11)
RDW: 13.5 % (ref 11.5–15.5)
WBC: 7.3 10*3/uL (ref 4.0–10.5)

## 2016-04-13 LAB — BASIC METABOLIC PANEL
Anion gap: 9 (ref 5–15)
BUN: 12 mg/dL (ref 6–20)
CO2: 29 mmol/L (ref 22–32)
Calcium: 8.9 mg/dL (ref 8.9–10.3)
Chloride: 98 mmol/L — ABNORMAL LOW (ref 101–111)
Creatinine, Ser: 0.75 mg/dL (ref 0.44–1.00)
GFR calc Af Amer: >60 mL/min (ref >60)
GFR calc non Af Amer: >60 mL/min (ref >60)
Glucose, Bld: 109 mg/dL — ABNORMAL HIGH (ref 65–99)
Potassium: 3.5 mmol/L (ref 3.5–5.1)
Sodium: 136 mmol/L (ref 135–145)

## 2016-04-13 LAB — URINE CULTURE

## 2016-04-13 NOTE — Progress Notes (Signed)
Occupational Therapy Session Note  Patient Details  Name: Sheri Baker MRN: [id number] Date of Birth: 30-May-1951  Today's Date: 04/13/2016 OT Individual Time:  -  0901- 1001 60 minutes   Skilled Therapeutic Interventions/Progress Updates:  Pt participated in self care mgt retraining tx focusing on self sequencing and setup of ADLs. Today pt required supervision for mod questioning cues to gather necessary bathing and dressing items with supervision for carryover of adaptive technique with RW. Pt provided education on proper placement of ADL items in shower room post d/c including back orthosis to ensure safety during ADL completion with pt verbalized understanding. Pt completed UB/LB bathing on shower bench with min instructional cues for safety with donning orthosis prior to standing to dry off. All ADL items within reach during bathing and pt was provided education on sequencing UB dressing completion prior to standing with RW for safety with adherence to back precautions. LB ADLs completed w/c level standing as needed with RW and supervision while standing. Pts standing balance is improving; pt still requires prompting for initiation, sequencing and problem solving during ADL tasks and benefits from continued training in these deficit areas for safe discharge home.  Pt left in w/c at end of treatment with all needs within reach. 02 sats monitored during treatment with 90-92% saturation during ADLs. IV wrapped in shower with plastic and gauze.       2nd tx 1:1 1300-1346 46 minutes Pt participated in tx focusing on IADL retraining and adaptive technique training for adherence to back precautions while completing kitchen tasks post d/c home. Pt was provided education on EC/WS techniques, environmental modifications for frequently used kitchen items, and use of AE for retrieval of light items in high and low locations. Reacher bag provided for RW. Pt was provided questioning cues to facilitate problem  solving with pt demonstrating anticipatory cognitive awareness and increased safety awareness when prompted for problem solving during hypothetical and unsafe situations that may arise at home, i.e. Dropping items on floor or needing trash taken out. Pt was provided instructional cuing for modifying home environment to increase safety during daily routine, i.e. Removal of throw rugs, changing location of pots/pans, and dish soap, table, as well as addition of RW basket for easy kitchen item transport. Pt exhibited good carryover of proper RW placement during retrieval and putting away kitchen items. Pt reported that session was helpful for "knowing what to do" in kitchen post d/c. Pt returned to room post session and completed toileting transfer with supervision and RW. Pt was in bathroom with CNA present upon skilled OT departure. Plans made to follow up with sister Sheri Baker regarding making environmental modifications prior to d/c.   Therapy Documentation Precautions:  Precautions Precautions: Back Precaution Booklet Issued: Yes (comment) Precaution Comments: Able to recall 3/3 Required Braces or Orthoses: Spinal Brace Spinal Brace: Lumbar corset Restrictions Weight Bearing Restrictions: No     Pain: Pt reported minimal pain this AM during treatment    ADL ADL Comments: see Assessment Tool       See Function Navigator for Current Functional Status.   Therapy/Group: Individual Therapy  Sheri Baker 04/13/2016, 12:20 PM

## 2016-04-13 NOTE — Progress Notes (Signed)
[facility] PHYSICAL MEDICINE & REHABILITATION     PROGRESS NOTE    Subjective/Complaints: Patient seen lying in bed this morning. She states she has pain in her legs and back. She notes that she had good relief with the adjustment pain medication yesterday, but it made her sleepy.  ROS: + Bilateral lower extremity and back pain. Denies SOB, N/V/D.  Objective: Vital Signs: Blood pressure 140/71, pulse 98, temperature 98.2 F (36.8 C), temperature source Oral, resp. rate 18, height 5\' 5"  (1.651 m), weight 130.772 kg (288 lb 4.8 oz), SpO2 93 %. No results found.  Recent Labs  04/11/16 0720 04/13/16 0631  WBC 9.7 7.3  HGB 9.8* 9.7*  HCT 31.2* 30.8*  PLT 95* 132*    Recent Labs  04/13/16 0631  NA 136  K 3.5  CL 98*  GLUCOSE 109*  BUN 12  CREATININE 0.75  CALCIUM 8.9   CBG (last 3)   Recent Labs  04/12/16 1628 04/12/16 2015 04/13/16 0643  GLUCAP 111* 126* 111*    Wt Readings from Last 3 Encounters:  04/09/16 130.772 kg (288 lb 4.8 oz)  03/27/16 138.71 kg (305 lb 12.8 oz)  03/20/16 129 kg (284 lb 6.3 oz)    Physical Exam:  Constitutional: She appears well-developed and well-nourished. No distress. Morbidly obese  HENT: Normocephalic and atraumatic.  Eyes: Conjunctivae and Conj are normal.   Cardiovascular: Regular rate and rhythm. No murmur heard. Respiratory: Effort normal. No stridor. She has no wheezes.  GI: Soft. Bowel sounds are normal. There is no tenderness.  Musculoskeletal: She exhibits edema. Trace to 1+ pretibial edema  Neurological: She is alert and oriented.  B/l UE strength 4+/5.  RLE: 4-/5 proximally, 4+/5 distally LLE: 4/5 proximally, 4+/5 distally Skin: Skin is warm and dry. She is not diaphoretic.  Lower back incision c/d/i. Psychiatric: Anxious. Her speech is normal. Judgment and thought content normal. Cognition and memory are normal.   Assessment/Plan: 1. Functional and mobility deficits secondary to lumbar  spondylolisthesis/radiculopathy which require 3+ hours per day of interdisciplinary therapy in a comprehensive inpatient rehab setting. Physiatrist is providing close team supervision and 24 hour management of active medical problems listed below. Physiatrist and rehab team continue to assess barriers to discharge/monitor patient progress toward functional and medical goals.  Function:  Bathing Bathing position   Position: Shower  Bathing parts Body parts bathed by patient: Right arm, Left arm, Abdomen, Chest, Buttocks, Front perineal area, Right upper leg, Left upper leg, Right lower leg, Left lower leg, Back Body parts bathed by helper: Buttocks  Bathing assist Assist Level: Set up   Set up : To obtain items  Upper Body Dressing/Undressing Upper body dressing   What is the patient wearing?: Pull over shirt/dress, Orthosis Bra - Perfomed by patient: Thread/unthread right bra strap, Thread/unthread left bra strap, Hook/unhook bra (pull down sports bra)   Pull over shirt/dress - Perfomed by patient: Thread/unthread right sleeve, Thread/unthread left sleeve, Put head through opening, Pull shirt over trunk       Orthosis activity level: Performed by patient  Upper body assist Assist Level: Set up   Set up : To obtain clothing/put away  Lower Body Dressing/Undressing Lower body dressing   What is the patient wearing?: Underwear, Pants, Socks, [employer]  - Performed by patient: Thread/unthread right underwear leg, Thread/unthread left underwear leg, Pull underwear up/down   Pants- Performed by patient: Thread/unthread right pants leg, Thread/unthread left pants leg, Pull pants up/down Pants- Performed by helper: Pull pants  up/down Non-skid slipper socks- Performed by patient: Don/doff right sock, Don/doff left sock Non-skid slipper socks- Performed by helper: Don/doff right sock, Don/doff left sock (due to time contraints)               TED Hose - Performed by helper:  Don/doff right TED hose, Don/doff left TED hose  Lower body assist Assist for lower body dressing: Touching or steadying assistance (Pt > 75%)      Toileting Toileting   Toileting steps completed by patient: Adjust clothing prior to toileting, Performs perineal hygiene, Adjust clothing after toileting Toileting steps completed by helper: Performs perineal hygiene Toileting Assistive Devices: Toilet aid, Grab bar or rail  Toileting assist Assist level: Set up/obtain supplies   Transfers Chair/bed transfer   Chair/bed transfer method: Ambulatory Chair/bed transfer assist level: Supervision or verbal cues Chair/bed transfer assistive device: Medical sales representative     Max distance: 131ft Assist level: Supervision or verbal cues   Wheelchair   Type: Manual Max wheelchair distance: 180 ft Assist Level: Supervision or verbal cues  Cognition Comprehension Comprehension assist level: Follows basic conversation/direction with extra time/assistive device  Expression Expression assist level: Expresses basic needs/ideas: With extra time/assistive device  Social Interaction Social Interaction assist level: Interacts appropriately 90% of the time - Needs monitoring or encouragement for participation or interaction.  Problem Solving Problem solving assist level: Solves basic problems with no assist  Memory Memory assist level: Recognizes or recalls 90% of the time/requires cueing < 10% of the time   Medical Problem List and Plan: 1. Functional and mobility deficits secondary to lumbar spondlylolisthesis and radiculopathy  -Continue CIR 2. DVT Prophylaxis/Anticoagulation: Mechanical: Sequential compression devices, below knee Bilateral lower extremities   CTA ordered on 6/19 for chest pain, reviewed, suggesting PE.  Xarelto started on 6/19. Temporarily DC'd, however after conversation with heme/onc resumed.  Will continue to monitor platelets 3. Pain Management: Changed  hydrocodone (causes palpitations) to oxycodone prn. Scheduled tramadol for better pain control.   -robaxin helpful for pain-  750mg  qid scheduled  -utilizing ice/heat also  -Gabapentin increased to 600 3 times a day at 6/13, increased to 900 on 6/22   -OxyContin 10 mg twice a day started on 6/14---wean prior to discharge  -Added lidoderm patch on 6/20  -Tizanidine 2 mg q6 when necessary started on 6/22 4. Mood: Team to provide ego support. LCSW to follow for evaluation and support.   Klonipin 0.25 BID PRN started on 6/20, patient does not appear to be taking it frequently. Spoke to nursing about his medication as a lot of the patient's symptoms appear to be related to her anxiety. She does better during the day when she is engaged in active and seems to complain of significantly more pain at night. 5. Neuropsych: This patient is capable of making decisions on her own behalf. 6. Skin/Wound Care: Monitor wound daily for healing. Maintain adequate nutritional and hydration status.  7. Fluids/Electrolytes/Nutrition: Cont I/Os  Mild hyponatremia: 133 on 6/20.   8. T2DM--diet controlled: tight control at present  9. HTN: Monitor BP. On aldactone/HCTZ.  Cozaar--continue to hold. Encourage fluid intake.  10 ABLA:  Added iron supplement.   Hemoglobin 9.7 on 6/23  11. Leucocytosis: Resolved  No fevers or other signs of infection.   WBCs within normal limits  UA with few bacteria, U culture with multiple species  Will continue to monitor 12. Constipation:  Increased bowel meds on 6/12, with results 13. PE  CXR  6/19 reviewed with vascular congestion  ECG reviewed, sinus tach, repeat ECG results pending  CTA suggesting PE 14. Thrombocytopenia: Improving  Secondary to Xarelto  Platelets 132 on 6/23  LOS (Days) 14 A FACE TO FACE EVALUATION WAS PERFORMED  Ankit [name] 04/13/2016 10:23 AM

## 2016-04-13 NOTE — Progress Notes (Signed)
Physical Therapy Session Note  Patient Details  Name: Sheri Baker MRN: [id number] Date of Birth: 1950-10-28  Today's Date: 04/13/2016 PT Individual Time: 1100-1205 AND [id number] PT Individual Time Calculation (min): 65 min AND 26 min   Short Term Goals: Week 1:  PT Short Term Goal 1 (Week 1): STG=LTG due to ELOS  Skilled Therapeutic Interventions/Progress Updates:    Patient received sitting in [facility] and agreeable to PT. Patient performed WC mobility training for 270ft with Supervision A   Stand pivot transfer training instructed with supervision A from PT x 6 throughout treatment. Sit<>stand transfers with RW for various heights with supervision A from PT and min cues for improved posture   Dynamic balance training with foot taps on 4 inch steps.  Lateral mini lnuges x 8 BLE Forward min lunges. x8 BLE.  Static standing balance on airex pad 1 minute with BUE support. 30 seconds with 1 UE support 30 seconds without UE support.  Throughout all balance activities, PT provided min A for improved safety and proper BOS to increase stability and reduce pressure on the low back. No UE support was required for dynamic balance activities  Gait training for, 52ft, 171ft and 187ft with RW and supervision A from PT for improved safety as well as min cues for increased step length. Patient responded very well to instruction.     Session 2  Patient received sitting in recliner and agreeable to PT  Gait training in controlled environment for 136ft  x2. PT provided supervision A and occasional cues for AD management and improved step length bilaterally. Patient was able to maintain improved gait 85% of the time after cues.   Gait training on carpeted surface around up to 10 obstacles for 140ft and 60 ft. PT provided supervision A and min cues for AD management and improved awareness of space and size of RW.    Patient returned to room and left sitting recliner with call bell within reach and call bell  within reach.   Therapy Documentation Precautions:  Precautions Precautions: Back Precaution Booklet Issued: Yes (comment) Precaution Comments: Able to recall 3/3 Required Braces or Orthoses: Spinal Brace Spinal Brace: Lumbar corset Restrictions Weight Bearing Restrictions: No Vital Signs: Therapy Vitals Temp: 97.7 F (36.5 C) Temp Source: Oral Pulse Rate: (!) 116 Resp: 16 BP: 125/89 mmHg Patient Position (if appropriate): Lying Oxygen Therapy SpO2: 94 % O2 Device: Not Delivered Pain:   6/10 radiating pain in L LE.   See Function Navigator for Current Functional Status.   Therapy/Group: Individual Therapy  [name] 04/13/2016, 5:20 PM

## 2016-04-14 ENCOUNTER — Inpatient Hospital Stay (HOSPITAL_COMMUNITY): Payer: BLUE CROSS/BLUE SHIELD | Admitting: Physical Therapy

## 2016-04-14 LAB — CBC WITH DIFFERENTIAL/PLATELET
Basophils Absolute: 0 10*3/uL (ref 0.0–0.1)
Basophils Relative: 0 %
Eosinophils Absolute: 0.2 10*3/uL (ref 0.0–0.7)
Eosinophils Relative: 2 %
HCT: 31.1 % — ABNORMAL LOW (ref 36.0–46.0)
Hemoglobin: 9.6 g/dL — ABNORMAL LOW (ref 12.0–15.0)
Lymphocytes Relative: 30 %
Lymphs Abs: 2.1 10*3/uL (ref 0.7–4.0)
MCH: 25.8 pg — ABNORMAL LOW (ref 26.0–34.0)
MCHC: 30.9 g/dL (ref 30.0–36.0)
MCV: 83.6 fL (ref 78.0–100.0)
Monocytes Absolute: 0.5 10*3/uL (ref 0.1–1.0)
Monocytes Relative: 7 %
Neutro Abs: 4.3 10*3/uL (ref 1.7–7.7)
Neutrophils Relative %: 61 %
Platelets: 155 10*3/uL (ref 150–400)
RBC: 3.72 MIL/uL — ABNORMAL LOW (ref 3.87–5.11)
RDW: 13.6 % (ref 11.5–15.5)
WBC: 7.1 10*3/uL (ref 4.0–10.5)

## 2016-04-14 LAB — GLUCOSE, CAPILLARY
Glucose-Capillary: 106 mg/dL — ABNORMAL HIGH (ref 65–99)
Glucose-Capillary: 100 mg/dL — ABNORMAL HIGH (ref 65–99)
Glucose-Capillary: 96 mg/dL (ref 65–99)
Glucose-Capillary: 121 mg/dL — ABNORMAL HIGH (ref 65–99)

## 2016-04-14 MED ORDER — HYDROCODONE-ACETAMINOPHEN 5-325 MG PO TABS
1.0000 | ORAL_TABLET | ORAL | Status: DC | PRN
  Administered 2016-04-14 – 2016-04-15 (×5): 1 via ORAL
  Filled 2016-04-14 (×6): qty 1

## 2016-04-14 NOTE — Progress Notes (Signed)
[facility] PHYSICAL MEDICINE & REHABILITATION     PROGRESS NOTE    Subjective/Complaints: No CP or SOB C/O buttock and post thigh pain at night.  States change in gabapentin was not helpful.  WOuld like something to take in early am hours Pt states she had better relief with hydrocodone than oxycodone, palpitation only with high dose   ROS: + Bilateral lower extremity and back pain. Denies SOB, N/V/D.  Objective: Vital Signs: Blood pressure 127/83, pulse 100, temperature 97.9 F (36.6 C), temperature source Oral, resp. rate 18, height 5\' 5"  (1.651 m), weight 129.547 kg (285 lb 9.6 oz), SpO2 94 %. No results found.  Recent Labs  04/13/16 0631 04/14/16 0609  WBC 7.3 7.1  HGB 9.7* 9.6*  HCT 30.8* 31.1*  PLT 132* 155    Recent Labs  04/13/16 0631  NA 136  K 3.5  CL 98*  GLUCOSE 109*  BUN 12  CREATININE 0.75  CALCIUM 8.9   CBG (last 3)   Recent Labs  04/13/16 1657 04/13/16 2037 04/14/16 0630  GLUCAP 125* 114* 106*    Wt Readings from Last 3 Encounters:  04/14/16 129.547 kg (285 lb 9.6 oz)  03/27/16 138.71 kg (305 lb 12.8 oz)  03/20/16 129 kg (284 lb 6.3 oz)    Physical Exam:  Constitutional: She appears well-developed and well-nourished. No distress. Morbidly obese  HENT: Normocephalic and atraumatic.  Eyes: Conjunctivae and Conj are normal.   Cardiovascular: Regular rate and rhythm. No murmur heard. Respiratory: Effort normal. No stridor. She has no wheezes.  GI: Soft. Bowel sounds are normal. There is no tenderness.  Musculoskeletal: She exhibits edema. Trace to 1+ pretibial edema  Neurological: She is alert and oriented.  B/l UE strength 4+/5.  RLE: 4-/5 proximally, 4+/5 distally LLE: 4/5 proximally, 4+/5 distally Skin: Skin is warm and dry. She is not diaphoretic.  Lower back incision c/d/i. Psychiatric: Anxious. Her speech is normal. Judgment and thought content normal. Cognition and memory are normal.   Assessment/Plan: 1. Functional and  mobility deficits secondary to lumbar spondylolisthesis/radiculopathy which require 3+ hours per day of interdisciplinary therapy in a comprehensive inpatient rehab setting. Physiatrist is providing close team supervision and 24 hour management of active medical problems listed below. Physiatrist and rehab team continue to assess barriers to discharge/monitor patient progress toward functional and medical goals.  Function:  Bathing Bathing position   Position: Shower  Bathing parts Body parts bathed by patient: Right arm, Left arm, Chest, Abdomen, Front perineal area, Buttocks, Right upper leg, Left upper leg, Right lower leg, Left lower leg, Back Body parts bathed by helper: Buttocks  Bathing assist Assist Level: Assistive device, Set up   Set up : To obtain items  Upper Body Dressing/Undressing Upper body dressing   What is the patient wearing?: Pull over shirt/dress, Bra, Orthosis Bra - Perfomed by patient: Hook/unhook bra (pull down sports bra), Thread/unthread left bra strap Bra - Perfomed by helper: Thread/unthread right bra strap Pull over shirt/dress - Perfomed by patient: Thread/unthread right sleeve, Thread/unthread left sleeve, Put head through opening, Pull shirt over trunk       Orthosis activity level: Performed by patient  Upper body assist Assist Level: Set up   Set up : To obtain clothing/put away  Lower Body Dressing/Undressing Lower body dressing   What is the patient wearing?: Underwear, Pants, Non-skid slipper socks, Ted Hose Underwear - Performed by patient: Thread/unthread right underwear leg, Thread/unthread left underwear leg, Pull underwear up/down   Pants- Performed  by patient: Thread/unthread right pants leg, Thread/unthread left pants leg, Pull pants up/down Pants- Performed by helper: Pull pants up/down Non-skid slipper socks- Performed by patient: Don/doff right sock, Don/doff left sock Non-skid slipper socks- Performed by helper: Don/doff right sock,  Don/doff left sock (due to time constraints)               TED Hose - Performed by helper: Don/doff right TED hose, Don/doff left TED hose  Lower body assist Assist for lower body dressing: Supervision or verbal cues      Toileting Toileting   Toileting steps completed by patient: Adjust clothing prior to toileting, Performs perineal hygiene, Adjust clothing after toileting Toileting steps completed by helper: Performs perineal hygiene Toileting Assistive Devices: Grab bar or rail  Toileting assist Assist level: Supervision or verbal cues   Transfers Chair/bed transfer   Chair/bed transfer method: Stand pivot Chair/bed transfer assist level: Supervision or verbal cues Chair/bed transfer assistive device: Armrests, Medical sales representative     Max distance: 148ft Assist level: Supervision or verbal cues   Wheelchair   Type: Manual Max wheelchair distance: 13ft Assist Level: Supervision or verbal cues  Cognition Comprehension Comprehension assist level: Follows complex conversation/direction with extra time/assistive device  Expression Expression assist level: Expresses complex 90% of the time/cues < 10% of the time  Social Interaction Social Interaction assist level: Interacts appropriately 90% of the time - Needs monitoring or encouragement for participation or interaction.  Problem Solving Problem solving assist level: Solves basic problems with no assist  Memory Memory assist level: Recognizes or recalls 90% of the time/requires cueing < 10% of the time   Medical Problem List and Plan: 1. Functional and mobility deficits secondary to lumbar spondlylolisthesis and radiculopathy, s/p lami and decompression  -Continue CIR 2. DVT Prophylaxis/Anticoagulation: Mechanical: Sequential compression devices, below knee Bilateral lower extremities   CTA bilateral PE  Xarelto started on 6/19. Temporarily DC'd, however after conversation with heme/onc resumed.  Will  continue to monitor platelets 3. Pain Management: Changed hydrocodone (causes palpitations) to oxycodone prn. Scheduled tramadol for better pain control.   -robaxin helpful for pain-  750mg  qid scheduled  -utilizing ice/heat also  -Gabapentin increased to 600 3 times a day at 6/13, increased to 900 on 6/22   -OxyContin 10 mg twice a day started on 6/14---wean prior to discharge, Changed oxycodone to hydrocodone Q4h prn  -Added lidoderm patch on 6/20  -Tizanidine 2 mg q6 when necessary started on 6/22 4. Mood: Team to provide ego support. LCSW to follow for evaluation and support.   Klonipin 0.25 BID PRN started on 6/20, patient does not appear to be taking it frequently. Spoke to nursing about his medication as a lot of the patient's symptoms appear to be related to her anxiety. She does better during the day when she is engaged in active and seems to complain of significantly more pain at night. 5. Neuropsych: This patient is capable of making decisions on her own behalf. 6. Skin/Wound Care: Monitor wound daily for healing. Maintain adequate nutritional and hydration status.  7. Fluids/Electrolytes/Nutrition: Cont I/Os  Mild hyponatremia: 133 on 6/20.   8. T2DM--diet controlled: tight control at present  9. HTN: Monitor BP. On aldactone/HCTZ.  Cozaar--continue to hold. Encourage fluid intake.  10 ABLA:  Added iron supplement.   Hemoglobin 9.7 on 6/23  11. Leucocytosis: Resolved  No fevers or other signs of infection.   WBCs within normal limits  UA with few bacteria, U  culture with multiple species  Will continue to monitor 12. Constipation:  Increased bowel meds on 6/12, with results 13. PE  Xarelto 14. Thrombocytopenia: Improving   Platelets 132 on 6/23  LOS (Days) 15 A FACE TO FACE EVALUATION WAS PERFORMED  KIRSTEINS,ANDREW E 04/14/2016 9:22 AM

## 2016-04-14 NOTE — Progress Notes (Signed)
Physical Therapy Session Note  Patient Details  Name: Sheri Baker MRN: [id number] Date of Birth: [birth year]  Today's Date: 04/14/2016 PT Individual Time: 3729-0211 PT Individual Time Calculation (min): 39 min   Short Term Goals: Week 1:  PT Short Term Goal 1 (Week 1): STG=LTG due to ELOS  Skilled Therapeutic Interventions/Progress Updates:    Patient received sitting in recliner and agreeable to PT.  Gait training performed in hall for 163f and 1271fwith supervision A from PT and min cues for posture. Patient was able to respond well to instruction from PT and maintain improved gait/posture throughout rest of treatment. Gait training also performed on handicap ramp for 1019for ascent and descent with supervision A from PT for improved Ad management with ascent.  PT instructed patient in Car transfer with supervision A and min cues for improved UE placement to mimic car transfer at home and increase safety.  WC mobility training for [id number] by PT with supervision A and min cues for improved doorway management and awareness to size of WC.   Returned to room with NT present, [place] in [place] needs met   Therapy Documentation Precautions:  Precautions Precautions: Back Precaution Booklet Issued: Yes (comment) Precaution Comments: Able to recall 3/3 Required Braces or Orthoses: Spinal Brace Spinal Brace: Lumbar corset Restrictions Weight Bearing Restrictions: No    Pain: Pain Assessment Pain Assessment: 0-10 Pain Score: 5 Pain Type: Acute pain Pain Location: Leg Pain Orientation: Right;Left Pain Descriptors / Indicators: Throbbing Pain Onset: On-going Pain Intervention(s): Medication (See eMAR)  See Function Navigator for Current Functional Status.   Therapy/Group: Individual Therapy  AustLorie Phenix4/2017, 5:34 PM

## 2016-04-15 ENCOUNTER — Inpatient Hospital Stay (HOSPITAL_COMMUNITY): Payer: BLUE CROSS/BLUE SHIELD | Admitting: Physical Therapy

## 2016-04-15 LAB — CBC WITH DIFFERENTIAL/PLATELET
Basophils Absolute: 0 10*3/uL (ref 0.0–0.1)
Basophils Relative: 0 %
Eosinophils Absolute: 0.2 10*3/uL (ref 0.0–0.7)
Eosinophils Relative: 3 %
HCT: 31.2 % — ABNORMAL LOW (ref 36.0–46.0)
Hemoglobin: 9.7 g/dL — ABNORMAL LOW (ref 12.0–15.0)
Lymphocytes Relative: 32 %
Lymphs Abs: 1.8 10*3/uL (ref 0.7–4.0)
MCH: 26.1 pg (ref 26.0–34.0)
MCHC: 31.1 g/dL (ref 30.0–36.0)
MCV: 84.1 fL (ref 78.0–100.0)
Monocytes Absolute: 0.5 10*3/uL (ref 0.1–1.0)
Monocytes Relative: 9 %
Neutro Abs: 3.2 10*3/uL (ref 1.7–7.7)
Neutrophils Relative %: 56 %
Platelets: 167 10*3/uL (ref 150–400)
RBC: 3.71 MIL/uL — ABNORMAL LOW (ref 3.87–5.11)
RDW: 13.7 % (ref 11.5–15.5)
WBC: 5.6 10*3/uL (ref 4.0–10.5)

## 2016-04-15 LAB — GLUCOSE, CAPILLARY
Glucose-Capillary: 100 mg/dL — ABNORMAL HIGH (ref 65–99)
Glucose-Capillary: 105 mg/dL — ABNORMAL HIGH (ref 65–99)
Glucose-Capillary: 101 mg/dL — ABNORMAL HIGH (ref 65–99)
Glucose-Capillary: 134 mg/dL — ABNORMAL HIGH (ref 65–99)

## 2016-04-15 MED ORDER — OXYCODONE HCL 5 MG PO TABS
5.0000 mg | ORAL_TABLET | ORAL | Status: DC | PRN
  Administered 2016-04-16: 10 mg via ORAL
  Administered 2016-04-16: 5 mg via ORAL
  Administered 2016-04-16 – 2016-04-18 (×8): 10 mg via ORAL
  Filled 2016-04-15 (×11): qty 2

## 2016-04-15 NOTE — Progress Notes (Signed)
Physical Therapy Weekly Progress Note  Patient Details  Name: Sheri Baker MRN: [id number] Date of Birth: 02-08-1951  Beginning of progress report period: April 07, 2016 End of progress report period: April 15, 2016  Today's Date: 04/15/2016 PT Individual Time: 9292-4462 PT Individual Time Calculation (min): 45 min   Patient has met 1 of 1 short term goals.  Since last progress note pt has been limited by bilateral PE's and subsequent bed rest (ordered my MD) for 2 days. Following medical clearance pt has made good progress in therapy, as noted by her ability to ambulate 300 ft with RW & supervision. Pt also on supplemental oxygen via nasal cannula following PE's, but has now returned to room air to maintain normal SpO2 levels. Pt is currently at supervision for transfers as she requires cuing for safety. Pt has potential to reach Mod I level of independence.  Patient continues to demonstrate the following deficits: impaired static & dynamic standing balance, muscle weakness, pain in back/buttocks/LEs, impaired safety awareness, decreased ability to negotiate curb and therefore will continue to benefit from skilled PT intervention to enhance overall performance with activity tolerance, balance, postural control, awareness and knowledge of precautions.  Patient progressing toward long term goals..  Continue plan of care.  PT Short Term Goals Week 3:  PT Short Term Goal 1 (Week 3): STG = LTG due to estimated d/c date.  Skilled Therapeutic Interventions/Progress Updates:    Pt received in recliner & agreeable to PT, noting 3/10 buttocks pain. Pt able to independently recall 3/3 back precautions & already had back brace donned upon PT arrival. Gait training x 300 ft room>ortho gym with RW & supervision A. Pt negotiated ramp with RW & Supervision; educated pt to use RW at home instead of rails on ramp, and to use ramp to enter house instead of entering via steps in garage. Educated pt on curb  negotiation & pt able to return demonstrate x 2 trials with maximum verbal cuing for sequencing & technique, rest break in between, & Min Guard assist. Gait training over uneven surface with RW & close supervision, x 10 ft + 10 ft. Completed 10x sit-to-stand from mat table without BUE support for BLE strengthening. Pt reported increasing pain in buttocks & posterior thighs with transfer that ceased when sitting. Gait training x 180 ft back to room with RW & supervision. Pt reported need to use restroom & completed stand>sit toilet transfer with supervision. Pt instructed to pull call bell when finished using restroom & she voiced understanding; RN notified of pt in bathroom.  Therapy Documentation Precautions:  Precautions Precautions: Back Precaution Booklet Issued: Yes (comment) Precaution Comments: Able to recall 3/3 Required Braces or Orthoses: Spinal Brace Spinal Brace: Lumbar corset Restrictions Weight Bearing Restrictions: No  Pain: Pain Assessment Pain Assessment: 0-10 Pain Score: 3  Pain Location: Buttocks Pain Intervention(s): Ambulation/increased activity   See Function Navigator for Current Functional Status.  Therapy/Group: Individual Therapy  [name] 04/15/2016, 12:44 PM

## 2016-04-15 NOTE — Progress Notes (Signed)
[facility] PHYSICAL MEDICINE & REHABILITATION     PROGRESS NOTE    Subjective/Complaints: No difference in pain relief with hydrocodone (which pt states usually works better for her) Able to participate in PT, feels better with standing and walking than with sitting Pt states she had better relief with hydrocodone than oxycodone, palpitation only with high dose   ROS: + Bilateral lower extremity and back pain. Denies SOB, N/V/D.  Objective: Vital Signs: Blood pressure 127/76, pulse 101, temperature 98.1 F (36.7 C), temperature source Oral, resp. rate 17, height 5\' 5"  (1.651 m), weight 129.547 kg (285 lb 9.6 oz), SpO2 96 %. No results found.  Recent Labs  04/14/16 0609 04/15/16 0645  WBC 7.1 5.6  HGB 9.6* 9.7*  HCT 31.1* 31.2*  PLT 155 167    Recent Labs  04/13/16 0631  NA 136  K 3.5  CL 98*  GLUCOSE 109*  BUN 12  CREATININE 0.75  CALCIUM 8.9   CBG (last 3)   Recent Labs  04/14/16 1634 04/14/16 2105 04/15/16 0708  GLUCAP 96 121* 100*    Wt Readings from Last 3 Encounters:  04/14/16 129.547 kg (285 lb 9.6 oz)  03/27/16 138.71 kg (305 lb 12.8 oz)  03/20/16 129 kg (284 lb 6.3 oz)    Physical Exam:  Constitutional: She appears well-developed and well-nourished. No distress. Morbidly obese  HENT: Normocephalic and atraumatic.  Eyes: Conjunctivae and Conj are normal.   Cardiovascular: Regular rate and rhythm. No murmur heard. Respiratory: Effort normal. No stridor. She has no wheezes.  GI: Soft. Bowel sounds are normal. There is no tenderness.  Musculoskeletal: She exhibits edema. Trace to 1+ pretibial edema  Neurological: She is alert and oriented.  B/l UE strength 4+/5.  RLE: 4-/5 proximally, 4+/5 distally LLE: 4/5 proximally, 4+/5 distally Skin: Skin is warm and dry. She is not diaphoretic.  Lower back incision c/d/i. Psychiatric: Anxious. Her speech is normal. Judgment and thought content normal. Cognition and memory are normal.    Assessment/Plan: 1. Functional and mobility deficits secondary to lumbar spondylolisthesis/radiculopathy which require 3+ hours per day of interdisciplinary therapy in a comprehensive inpatient rehab setting. Physiatrist is providing close team supervision and 24 hour management of active medical problems listed below. Physiatrist and rehab team continue to assess barriers to discharge/monitor patient progress toward functional and medical goals.  Function:  Bathing Bathing position   Position: Shower  Bathing parts Body parts bathed by patient: Right arm, Left arm, Chest, Abdomen, Front perineal area, Buttocks, Right upper leg, Left upper leg, Right lower leg, Left lower leg, Back Body parts bathed by helper: Buttocks  Bathing assist Assist Level: Assistive device, Set up   Set up : To obtain items  Upper Body Dressing/Undressing Upper body dressing   What is the patient wearing?: Pull over shirt/dress, Bra, Orthosis Bra - Perfomed by patient: Hook/unhook bra (pull down sports bra), Thread/unthread left bra strap Bra - Perfomed by helper: Thread/unthread right bra strap Pull over shirt/dress - Perfomed by patient: Thread/unthread right sleeve, Thread/unthread left sleeve, Put head through opening, Pull shirt over trunk       Orthosis activity level: Performed by patient  Upper body assist Assist Level: Set up   Set up : To obtain clothing/put away  Lower Body Dressing/Undressing Lower body dressing   What is the patient wearing?: Underwear, Pants, Non-skid slipper socks, Ted Hose Underwear - Performed by patient: Thread/unthread right underwear leg, Thread/unthread left underwear leg, Pull underwear up/down   Pants- Performed by  patient: Thread/unthread right pants leg, Thread/unthread left pants leg, Pull pants up/down Pants- Performed by helper: Pull pants up/down Non-skid slipper socks- Performed by patient: Don/doff right sock, Don/doff left sock Non-skid slipper socks-  Performed by helper: Don/doff right sock, Don/doff left sock (due to time constraints)               TED Hose - Performed by helper: Don/doff right TED hose, Don/doff left TED hose  Lower body assist Assist for lower body dressing: Supervision or verbal cues      Toileting Toileting   Toileting steps completed by patient: Adjust clothing prior to toileting, Performs perineal hygiene, Adjust clothing after toileting Toileting steps completed by helper: Performs perineal hygiene Toileting Assistive Devices: Grab bar or rail  Toileting assist Assist level: Supervision or verbal cues   Transfers Chair/bed transfer   Chair/bed transfer method: Ambulatory Chair/bed transfer assist level: Supervision or verbal cues Chair/bed transfer assistive device: Armrests, Medical sales representative     Max distance: 150 Assist level: Supervision or verbal cues   Wheelchair   Type: Manual Max wheelchair distance: 200 Assist Level: Supervision or verbal cues  Cognition Comprehension Comprehension assist level: Follows complex conversation/direction with extra time/assistive device  Expression Expression assist level: Expresses complex 90% of the time/cues < 10% of the time  Social Interaction Social Interaction assist level: Interacts appropriately 90% of the time - Needs monitoring or encouragement for participation or interaction.  Problem Solving Problem solving assist level: Solves basic problems with no assist  Memory Memory assist level: Recognizes or recalls 90% of the time/requires cueing < 10% of the time   Medical Problem List and Plan: 1. Functional and mobility deficits secondary to lumbar spondlylolisthesis and radiculopathy, s/p lami and decompression  -Continue CIR PT, OT 2. DVT Prophylaxis/Anticoagulation: Mechanical: Sequential compression devices, below knee Bilateral lower extremities   CTA bilateral PE  Xarelto started on 6/19. Temporarily DC'd, however after  conversation with heme/onc resumed.  Will continue to monitor platelets 3. Pain Management: Changed hydrocodone back to oxycodone prn. Scheduled tramadol for better pain control.   -robaxin helpful for pain-  750mg  qid scheduled  -utilizing ice/heat also  -Gabapentin increased to 600 3 times a day at 6/13, increased to 900 on 6/22   -OxyContin 10 mg twice a day started on 6/14---wean prior to discharge,   -Added lidoderm patch on 6/20  -Tizanidine 2 mg q6 when necessary started on 6/22 4. Mood: Team to provide ego support. LCSW to follow for evaluation and support.   Klonipin 0.25 BID PRN started on 6/20, patient does not appear to be taking it frequently. Spoke to nursing about his medication as a lot of the patient's symptoms appear to be related to her anxiety. She does better during the day when she is engaged in active and seems to complain of significantly more pain at night. 5. Neuropsych: This patient is capable of making decisions on her own behalf. 6. Skin/Wound Care: Monitor wound daily for healing. Maintain adequate nutritional and hydration status.  7. Fluids/Electrolytes/Nutrition: Cont I/Os  Mild hyponatremia: 133 on 6/20.   8. T2DM--diet controlled: tight control at present  9. HTN: Monitor BP. On aldactone/HCTZ.  Cozaar--continue to hold. Encourage fluid intake.  10 ABLA:  Added iron supplement.    Hemoglobin 9.7 on 6/23  11. Leucocytosis: Resolved  No fevers or other signs of infection.   WBCs within normal limits  UA with few bacteria, U culture with multiple species  Will continue to monitor 12. Constipation:  Increased bowel meds on 6/12, with results 13. PE  Xarelto 14. Thrombocytopenia: Improving   Platelets 132 on 6/23  LOS (Days) 16 A FACE TO FACE EVALUATION WAS PERFORMED  Charlett Blake 04/15/2016 9:39 AM

## 2016-04-16 ENCOUNTER — Inpatient Hospital Stay (HOSPITAL_COMMUNITY): Payer: BLUE CROSS/BLUE SHIELD | Admitting: Occupational Therapy

## 2016-04-16 ENCOUNTER — Inpatient Hospital Stay (HOSPITAL_COMMUNITY): Payer: BLUE CROSS/BLUE SHIELD

## 2016-04-16 ENCOUNTER — Ambulatory Visit (HOSPITAL_COMMUNITY): Payer: BLUE CROSS/BLUE SHIELD | Admitting: Physical Therapy

## 2016-04-16 ENCOUNTER — Inpatient Hospital Stay (HOSPITAL_COMMUNITY): Payer: BLUE CROSS/BLUE SHIELD | Admitting: Physical Therapy

## 2016-04-16 LAB — CBC WITH DIFFERENTIAL/PLATELET
Basophils Absolute: 0 10*3/uL (ref 0.0–0.1)
Basophils Relative: 0 %
Eosinophils Absolute: 0.2 10*3/uL (ref 0.0–0.7)
Eosinophils Relative: 2 %
HCT: 35.4 % — ABNORMAL LOW (ref 36.0–46.0)
Hemoglobin: 11 g/dL — ABNORMAL LOW (ref 12.0–15.0)
Lymphocytes Relative: 37 %
Lymphs Abs: 2.8 10*3/uL (ref 0.7–4.0)
MCH: 26.3 pg (ref 26.0–34.0)
MCHC: 31.1 g/dL (ref 30.0–36.0)
MCV: 84.5 fL (ref 78.0–100.0)
Monocytes Absolute: 0.4 10*3/uL (ref 0.1–1.0)
Monocytes Relative: 6 %
Neutro Abs: 4.1 10*3/uL (ref 1.7–7.7)
Neutrophils Relative %: 55 %
Platelets: 180 10*3/uL (ref 150–400)
RBC: 4.19 MIL/uL (ref 3.87–5.11)
RDW: 13.8 % (ref 11.5–15.5)
WBC: 7.5 10*3/uL (ref 4.0–10.5)

## 2016-04-16 LAB — GLUCOSE, CAPILLARY
Glucose-Capillary: 109 mg/dL — ABNORMAL HIGH (ref 65–99)
Glucose-Capillary: 100 mg/dL — ABNORMAL HIGH (ref 65–99)
Glucose-Capillary: 122 mg/dL — ABNORMAL HIGH (ref 65–99)
Glucose-Capillary: 129 mg/dL — ABNORMAL HIGH (ref 65–99)

## 2016-04-16 MED ORDER — CLONAZEPAM 0.5 MG PO TABS
0.5000 mg | ORAL_TABLET | Freq: Every day | ORAL | Status: DC
  Administered 2016-04-16 – 2016-04-17 (×2): 0.5 mg via ORAL
  Filled 2016-04-16 (×2): qty 1

## 2016-04-16 MED ORDER — TIZANIDINE HCL 2 MG PO TABS
2.0000 mg | ORAL_TABLET | Freq: Three times a day (TID) | ORAL | Status: DC
  Administered 2016-04-16 – 2016-04-18 (×6): 2 mg via ORAL
  Filled 2016-04-16 (×6): qty 1

## 2016-04-16 NOTE — Progress Notes (Signed)
Physical Therapy Session Note  Patient Details  Name: Avona Conlin MRN: [id number] Date of Birth: [birth year]  Today's Date: 04/16/2016 PT Group Time: 1330-1430 PT Group Time Calculation (min): 60 min  Short Term Goals: Week 3:  PT Short Term Goal 1 (Week 3): STG = LTG due to estimated d/c date.  Skilled Therapeutic Interventions/Progress Updates:   Pt received in bed returning from x-ray.  Performed supine > sit supervision and ambulated to bathroom with RW and supervision.  Performed all toileting tasks with supervision.  Pt participated in therapy group with focus on dynamic standing balance and gait with pt performing ramp negotiation, stepping over low obstacles, up/down curb, over compliant surface, weaving L and R around obstacles and side stepping L and R through narrow spaces x 2 reps x 75' with RW and supervision-min A.  Pt also performed dynamic standing balance exercises with bilat UE support on RW or // bars: 10 reps each alternating marching, 10 second single limb stance and lateral stepping to L and R for hip ABD.  Returned to room and pt set up in recliner with lunch.  Therapy Documentation Precautions:  Precautions Precautions: Back Precaution Booklet Issued: Yes (comment) Precaution Comments: Able to recall 3/3 Required Braces or Orthoses: Spinal Brace Spinal Brace: Lumbar corset Restrictions Weight Bearing Restrictions: No Pain: Pain Assessment Pain Assessment: No/denies pain  See Function Navigator for Current Functional Status.   Therapy/Group: Group Therapy  Raylene Everts [facility] 04/16/2016, 8:37 PM

## 2016-04-16 NOTE — Progress Notes (Signed)
[place] PHYSICAL MEDICINE & REHABILITATION     PROGRESS NOTE    Subjective/Complaints: Patient seen sitting up in her chair this morning. She notes that she still has buttock and left leg pain that did not seem to improve with the change in medications over the weekend.  ROS: + Left lower extremity and buttock pain. Denies SOB, N/V/D.  Objective: Vital Signs: Blood pressure 125/71, pulse 101, temperature 98.3 F (36.8 C), temperature source Oral, resp. rate 18, height 5\' 5"  (1.651 m), weight 125.057 kg (275 lb 11.2 oz), SpO2 95 %. No results found.  Recent Labs  04/14/16 0609 04/15/16 0645  WBC 7.1 5.6  HGB 9.6* 9.7*  HCT 31.1* 31.2*  PLT 155 167   No results for input(s): NA, K, CL, GLUCOSE, BUN, CREATININE, CALCIUM in the last 72 hours.  Invalid input(s): CO CBG (last 3)   Recent Labs  04/15/16 1624 04/15/16 2044 04/16/16 0648  GLUCAP 101* 134* 109*    Wt Readings from Last 3 Encounters:  04/16/16 125.057 kg (275 lb 11.2 oz)  03/27/16 138.71 kg (305 lb 12.8 oz)  03/20/16 129 kg (284 lb 6.3 oz)    Physical Exam:  Constitutional: She appears well-developed and well-nourished. No distress. Morbidly obese  HENT: Normocephalic and atraumatic.  Eyes: Conjunctivae and Conj are normal.   Cardiovascular: Regular rate and rhythm. No murmur heard. Respiratory: Effort normal. No stridor. She has no wheezes.  GI: Soft. Bowel sounds are normal. There is no tenderness.  Musculoskeletal: She exhibits edema. Trace to 1+ pretibial edema  Neurological: She is alert and oriented.  B/l UE strength 4+/5.  RLE: 4-/5 proximally, 4+/5 distally LLE: 4/5 proximally, 4+/5 distally Skin: Skin is warm and dry. She is not diaphoretic.  Lower back incision c/d/i. Psychiatric: Anxious. Her speech is normal. Judgment and thought content normal. Cognition and memory are normal.   Assessment/Plan: 1. Functional and mobility deficits secondary to lumbar  spondylolisthesis/radiculopathy which require 3+ hours per day of interdisciplinary therapy in a comprehensive inpatient rehab setting. Physiatrist is providing close team supervision and 24 hour management of active medical problems listed below. Physiatrist and rehab team continue to assess barriers to discharge/monitor patient progress toward functional and medical goals.  Function:  Bathing Bathing position   Position: Shower  Bathing parts Body parts bathed by patient: Right arm, Left arm, Chest, Abdomen, Front perineal area, Buttocks, Right upper leg, Left upper leg, Right lower leg, Left lower leg, Back Body parts bathed by helper: Buttocks  Bathing assist Assist Level: Assistive device, Set up   Set up : To obtain items  Upper Body Dressing/Undressing Upper body dressing   What is the patient wearing?: Pull over shirt/dress, Bra, Orthosis Bra - Perfomed by patient: Hook/unhook bra (pull down sports bra), Thread/unthread left bra strap Bra - Perfomed by helper: Thread/unthread right bra strap Pull over shirt/dress - Perfomed by patient: Thread/unthread right sleeve, Thread/unthread left sleeve, Put head through opening, Pull shirt over trunk       Orthosis activity level: Performed by patient  Upper body assist Assist Level: Set up   Set up : To obtain clothing/put away  Lower Body Dressing/Undressing Lower body dressing   What is the patient wearing?: Underwear, Pants, Non-skid slipper socks, Ted Hose Underwear - Performed by patient: Thread/unthread right underwear leg, Thread/unthread left underwear leg, Pull underwear up/down   Pants- Performed by patient: Thread/unthread right pants leg, Thread/unthread left pants leg, Pull pants up/down Pants- Performed by helper: Pull pants up/down Non-skid  slipper socks- Performed by patient: Don/doff right sock, Don/doff left sock Non-skid slipper socks- Performed by helper: Don/doff right sock, Don/doff left sock (due to time  constraints)               TED Hose - Performed by helper: Don/doff right TED hose, Don/doff left TED hose  Lower body assist Assist for lower body dressing: Supervision or verbal cues      Toileting Toileting   Toileting steps completed by patient: Adjust clothing prior to toileting, Performs perineal hygiene, Adjust clothing after toileting Toileting steps completed by helper: Performs perineal hygiene Toileting Assistive Devices: Grab bar or rail, Toilet aid  Toileting assist Assist level: Supervision or verbal cues   Transfers Chair/bed transfer   Chair/bed transfer method: Ambulatory Chair/bed transfer assist level: Supervision or verbal cues Chair/bed transfer assistive device: Armrests, Medical sales representative     Max distance: 300 ft Assist level: Supervision or verbal cues   Wheelchair   Type: Manual Max wheelchair distance: 200 Assist Level: Supervision or verbal cues  Cognition Comprehension Comprehension assist level: Follows complex conversation/direction with no assist  Expression Expression assist level: Expresses complex ideas: With no assist  Social Interaction Social Interaction assist level: Interacts appropriately with others - No medications needed.  Problem Solving Problem solving assist level: Solves complex problems: Recognizes & self-corrects  Memory Memory assist level: Complete Independence: No helper   Medical Problem List and Plan: 1. Functional and mobility deficits secondary to lumbar spondlylolisthesis and radiculopathy, s/p lami and decompression  -Continue CIR  -Xrays ordered on 6/26 2. DVT Prophylaxis/Anticoagulation: Mechanical: Sequential compression devices, below knee Bilateral lower extremities   CTA bilateral PE  Xarelto started on 6/19. Temporarily DC'd, however after conversation with heme/onc resumed.  Will continue to monitor platelets, which cont to improve 3. Pain Management: Changed hydrocodone back to  oxycodone prn. Scheduled tramadol for better pain control.   -robaxin helpful for pain-  750mg  qid scheduled  -utilizing ice/heat also  -Gabapentin increased to 600 3 times a day at 6/13, increased to 900 on 6/22   -OxyContin 10 mg twice a day started on 6/14---wean prior to discharge,   -Added lidoderm patch on 6/20  -Tizanidine 2 mg TID started on 6/26 4. Mood: Team to provide ego support. LCSW to follow for evaluation and support.   Klonipin 0.5 qHS started on 6/26, much of pain may be related to anxiety, however,will rule out other causes. 5. Neuropsych: This patient is capable of making decisions on her own behalf. 6. Skin/Wound Care: Monitor wound daily for healing. Maintain adequate nutritional and hydration status.  7. Fluids/Electrolytes/Nutrition: Cont I/Os  Mild hyponatremia: 136 on 6/23.   8. T2DM--diet controlled: tight control at present  9. HTN: Monitor BP. On aldactone/HCTZ.  Cozaar--continue to hold. Encourage fluid intake.  10 ABLA:  Added iron supplement.    Hemoglobin 9.7 on 6/23  11. Leucocytosis: Resolved  No fevers or other signs of infection.   WBCs within normal limits  UA with few bacteria, U culture with multiple species  Will continue to monitor 12. Constipation:  Increased bowel meds on 6/12, with results 13. B/l PE  Xarelto 14. Thrombocytopenia: resolved   Platelets 167 on 6/25  LOS (Days) 17 A FACE TO FACE EVALUATION WAS PERFORMED  Ankit [name] 04/16/2016 10:14 AM

## 2016-04-16 NOTE — Progress Notes (Signed)
Occupational Therapy Session Note  Patient Details  Name: Sheri Baker MRN: [id number] Date of Birth: 17-Jul-1951  Today's Date: 04/16/2016 OT Individual Time: 1050-1135 OT Individual Time Calculation (min): 45 min   Short Term Goals: Week 1:  OT Short Term Goal 1 (Week 1): Bathe seated on tub bench using AE prn with min assist for thoroughness OT Short Term Goal 2 (Week 1): Dress lower body, sitting and standing using AE prn to maintian back precautions OT Short Term Goal 3 (Week 1): Complete light meal prep using LRAD and AE prn with instructional cues OT Short Term Goal 4 (Week 1): Maintain back precautions 100% of the time during performance of BADL for 1 hour with min vc  Skilled Therapeutic Interventions/Progress Updates:  Patient found seated in recliner with 5/10 complaints of pain in back, radiating down buttock to left leg; RN aware of pain and therapist monitored this during session. Pt stood with RW and supervision, then ambulated into BR with supervision. Pt doffed clothing in seated position, using reacher to increase independence (cues to use reacher needed). Pt completed UB/LB bathing in seated position from tub transfer bench in walk-in shower. Pt dried, donned undershirt, then lumbar corset and ambulated to w/c for LB dressing using reacher and sock aid prn. From here, pt ambulated around bed to recliner and therapist left pt seated in recliner with all needs within reach. Therapist notified RN that pt wanted pain patch donned after shower.   Therapy Documentation Precautions:  Precautions Precautions: Back Precaution Booklet Issued: Yes (comment) Precaution Comments: Able to recall 3/3 Required Braces or Orthoses: Spinal Brace Spinal Brace: Lumbar corset Restrictions Weight Bearing Restrictions: No  Vital Signs: Therapy Vitals Temp: 98.3 F (36.8 C) Temp Source: Oral Pulse Rate: (!) 101 Resp: 18 BP: 125/71 mmHg Patient Position (if appropriate): Lying Oxygen  Therapy SpO2: 95 % O2 Device: Not Delivered  See Function Navigator for Current Functional Status.  Therapy/Group: Individual Therapy  Chrys Racer , MS, OTR/L, CLT  04/16/2016, 11:36 AM

## 2016-04-16 NOTE — Progress Notes (Signed)
Recreational Therapy Session Note  Patient Details  Name: Sheri Baker MRN: [id number] Date of Birth: 07/29/1951 Today's Date: 04/16/2016  Pain: 7/10 back/buttock pain, premedicated Skilled Therapeutic Interventions/Progress Updates: Session focused on activity tolerance, dynamic standing balance, functional mobility on various indoor surfaces types, accessing public elevator, negotiating obstacles, sit to stands from chair without arm support, and discharge planning.  Pt performed sit-stand without UE support and ambulated using RW with supervision throughout session, needing 2 seated rest breaks due to fatigue.    Therapy/Group: Co-Treatment  Pt participated in animal assisted activity/therapy seated with supervision.   SIMPSON,LISA 04/16/2016, 3:43 PM

## 2016-04-16 NOTE — Progress Notes (Signed)
Physical Therapy Session Note  Patient Details  Name: Sheri Baker MRN: [id number] Date of Birth: 03-03-1951  Today's Date: 04/16/2016 PT Individual Time: 0900-1015 PT Individual Time Calculation (min): 75 min   Short Term Goals: Week 3:  PT Short Term Goal 1 (Week 3): STG = LTG due to estimated d/c date.  Skilled Therapeutic Interventions/Progress Updates:    Pt received in bathroom, (+) BM. Gait training from bathroom>sink for hand hygiene while standing>recliner all with supervision A & use of RW. Pt able to don new shirt independently & back brace with min A for placement of brace. Pt noted 7/10 back/buttocks pain that decreased to 4/10 back pain at end of session. RT present for first 45 minutes of session. Gait training from room off of unit & down to gift shop with RW & supervision before requiring seated rest break 2/2 fatigue. Pt able to complete sit<>stand transfers with supervision from chair without armrests. Pt negotiated small spaces in gift shop and tolerated standing/walking x 12 minutes 30 seconds before resting, then ambulating back to unit. Pt voiced concerns regarding transferring from her chair at home. In family room, added a 2nd cushion to simulate pt's recliner at home. Pt able to complete transfer off chair with supervision A & ambulated back to room. At end of session discussed need to stand as much as possible to tolerance while brushing teeth, performing grooming tasks, etc. Upon return home. Educated pt to have sister nearby when attempting to increase standing time, or to either have chair nearby that she can sit in; pt voiced understanding. Educated pt on Rayne following d/c. At end of session pt left in recliner in room with all needs within reach.  Therapy Documentation Precautions:  Precautions Precautions: Back Precaution Booklet Issued: Yes (comment) Precaution Comments: Able to recall 3/3 Required Braces or Orthoses: Spinal Brace Spinal Brace: Lumbar  corset Restrictions Weight Bearing Restrictions: No  Pain: Pain Assessment Pain Assessment: 0-10 Pain Score: 7  Pain Location: Back Pain Intervention(s):  (premedicated)   See Function Navigator for Current Functional Status.   Therapy/Group: Individual Therapy  [name] 04/16/2016, 7:45 AM

## 2016-04-17 ENCOUNTER — Inpatient Hospital Stay (HOSPITAL_COMMUNITY): Payer: BLUE CROSS/BLUE SHIELD | Admitting: Physical Therapy

## 2016-04-17 ENCOUNTER — Inpatient Hospital Stay (HOSPITAL_COMMUNITY): Payer: BLUE CROSS/BLUE SHIELD

## 2016-04-17 ENCOUNTER — Inpatient Hospital Stay (HOSPITAL_COMMUNITY): Payer: BLUE CROSS/BLUE SHIELD | Admitting: Occupational Therapy

## 2016-04-17 DIAGNOSIS — G609 Hereditary and idiopathic neuropathy, unspecified: Secondary | ICD-10-CM

## 2016-04-17 LAB — CBC WITH DIFFERENTIAL/PLATELET
Basophils Absolute: 0 10*3/uL (ref 0.0–0.1)
Basophils Relative: 0 %
Eosinophils Absolute: 0.2 10*3/uL (ref 0.0–0.7)
Eosinophils Relative: 3 %
HCT: 33.6 % — ABNORMAL LOW (ref 36.0–46.0)
Hemoglobin: 10.3 g/dL — ABNORMAL LOW (ref 12.0–15.0)
Lymphocytes Relative: 37 %
Lymphs Abs: 2.5 10*3/uL (ref 0.7–4.0)
MCH: 25.7 pg — ABNORMAL LOW (ref 26.0–34.0)
MCHC: 30.7 g/dL (ref 30.0–36.0)
MCV: 83.8 fL (ref 78.0–100.0)
Monocytes Absolute: 0.5 10*3/uL (ref 0.1–1.0)
Monocytes Relative: 7 %
Neutro Abs: 3.5 10*3/uL (ref 1.7–7.7)
Neutrophils Relative %: 53 %
Platelets: 201 10*3/uL (ref 150–400)
RBC: 4.01 MIL/uL (ref 3.87–5.11)
RDW: 13.7 % (ref 11.5–15.5)
WBC: 6.6 10*3/uL (ref 4.0–10.5)

## 2016-04-17 LAB — GLUCOSE, CAPILLARY
Glucose-Capillary: 104 mg/dL — ABNORMAL HIGH (ref 65–99)
Glucose-Capillary: 91 mg/dL (ref 65–99)
Glucose-Capillary: 111 mg/dL — ABNORMAL HIGH (ref 65–99)
Glucose-Capillary: 104 mg/dL — ABNORMAL HIGH (ref 65–99)

## 2016-04-17 MED ORDER — LIDOCAINE 5 % EX PTCH
1.0000 | MEDICATED_PATCH | CUTANEOUS | Status: DC
  Administered 2016-04-17 – 2016-04-18 (×2): 1 via TRANSDERMAL
  Filled 2016-04-17 (×2): qty 1

## 2016-04-17 MED ORDER — ESCITALOPRAM OXALATE 10 MG PO TABS
5.0000 mg | ORAL_TABLET | Freq: Every day | ORAL | Status: DC
  Administered 2016-04-17 – 2016-04-18 (×2): 5 mg via ORAL
  Filled 2016-04-17 (×2): qty 1

## 2016-04-17 NOTE — Progress Notes (Signed)
Social Work Patient ID: Sheri Baker, female   DOB: [birth year], 65 y.o.   MRN: [id number] Met with pt to discuss team conference reaching goals and preparing for discharge tomorrow. Family coming in for family training tomorrow.  Discussed follow up no pref, will find home health agency that services the [place], [place] area. Have ordered wide rolling walker and wide bedside commode to be delivered  To her room today. See family when here tomorrow to answer any last minute questions for discharge.

## 2016-04-17 NOTE — Progress Notes (Signed)
Physical Therapy Session Note  Patient Details  Name: Sheri Baker MRN: [id number] Date of Birth: 1951/09/28  Today's Date: 04/17/2016 PT Individual Time: 0800-0845 PT Individual Time Calculation (min): 45 min   Short Term Goals: Week 3:  PT Short Term Goal 1 (Week 3): STG = LTG due to estimated d/c date.  Skilled Therapeutic Interventions/Progress Updates:    Pt received seated in w/c, c/o LLE pain as described below and agreeable to treatment. Gait to gym x180' with RW and S; slow speed and occasional short standing rest breaks. Sit <>stand 2x10 reps without UE support; first trial from elevated mat table, second trial with table at lowest height. Progressive balance exercises including static surface, compliant surface, normal and narrow BOS, eyes open/eyes closed, staggered/tandem stance and single leg stance BLE; x30 sec hold in each condition. Requires min guard for eyes closed tasks with increased sway and SLS able to hold between 8-10 sec each side before requiring use of BUE on RW. Gait to return to room x180' with Rw and S. Remained seated in recliner at completion of session, all needs in reach.  Therapy Documentation Precautions:  Precautions Precautions: Back Precaution Booklet Issued: Yes (comment) Precaution Comments: Able to recall 3/3 Required Braces or Orthoses: Spinal Brace Spinal Brace: Lumbar corset Restrictions Weight Bearing Restrictions: No Pain: Pain Assessment Pain Score: 6  Pain Location: Back Pain Orientation: Right;Left Pain Radiating Towards: legs Pain Descriptors / Indicators: Aching Pain Intervention(s): Medication (See eMAR)   See Function Navigator for Current Functional Status.   Therapy/Group: Individual Therapy  Luberta Mutter 04/17/2016, 8:47 AM

## 2016-04-17 NOTE — Progress Notes (Signed)
Occupational Therapy Weekly Progress Note  Patient Details  Name: Sheri Baker MRN: [id number] Date of Birth: [birth year]    Pt with prolonged d/c due to bed rest secondary from PEs. Therapy continued after 2 days of bed rest on O2 but pt has not been able to be weened off O2 and is on room air. Pt continues to bathe at shower level with setup to mod I with extra time. Pt can dress with AE with extra time. D/c set for 6/28 and will d/c home with sister. Pt has returned demonstrated simple household tasks with RW supervision to mod I. Today is grad day and is on target for meeting her goals.  Continued skilled OT for d/c recommendations and conclude education today as appropriate.     Skilled Therapeutic Interventions/Progress Updates:    continue with POC  Therapy Documentation Precautions:  Precautions Precautions: Back Precaution Booklet Issued: Yes (comment) Precaution Comments: Able to recall 3/3 Required Braces or Orthoses: Spinal Brace Spinal Brace: Lumbar corset Restrictions Weight Bearing Restrictions: No General:   Vital Signs: Therapy Vitals Temp: 98 F (36.7 C) Temp Source: Oral Pulse Rate: (!) 101 Resp: 20 BP: (!) 144/78 mmHg Patient Position (if appropriate): Lying Oxygen Therapy SpO2: 98 % O2 Device: Not Delivered ADL: ADL ADL Comments: see Assessment Tool  See Function Navigator for Current Functional Status.   Therapy/Group: Individual Therapy  Willeen Cass [facility] 04/17/2016, 7:33 AM

## 2016-04-17 NOTE — Progress Notes (Signed)
Occupational Therapy Session Note  Patient Details  Name: Sheri Baker MRN: [id number] Date of Birth: [birth year]  Today's Date: 04/17/2016 OT Individual Time:  -  0903-1003  60 minutes       Skilled Therapeutic Interventions/Progress Updates:    Pt participated in tx focusing on self care mgt retraining for d/c tomorrow. Pt was provided 1 cue for initiation to gather ADL items in room with RW and Mod I. Pt completed functional shower transfer via tub bench Mod I demonstrating safety awareness with sitting to doff lumbar corset. Pt completed UB/LB bathing Mod I adhering to back precautions with use of AE. Pt required 1 safety cue during UB dressing for attempting to stand without back brace. Pt verbalized understanding and completed UB dressing with supervision. LB dressing completed at Mod I level standing as needed with RW. Pt was taken to laundry room to discuss laundry completion post d/c. Pt reported using laundromat and front loading washer with top loading dryer. Pt was provided questioning cues to encourage problem solving in community environment. Pt was educated on use of AE for laundry item retrieval and EC/WS training with teach back method. Will follow up with sister Sheri Baker in regards to laundry completion post d/c.   Therapy Documentation Precautions:  Precautions Precautions: Back Precaution Booklet Issued: Yes (comment) Precaution Comments: TLSO donned in sitting Required Braces or Orthoses: Spinal Brace Spinal Brace: Lumbar corset Restrictions Weight Bearing Restrictions: No General:   Vital Signs: Therapy Vitals Temp: 98 F (36.7 C) Temp Source: Oral Pulse Rate: (!) 110 Resp: 20 BP: 135/81 mmHg Patient Position (if appropriate): Sitting Oxygen Therapy SpO2: 94 % O2 Device: Not Delivered Pain: Pain Assessment Pain Assessment: 0-10 Pain Score: 5  Pain Type:  (L buttock & posterior thigh) Pain Location:  (L buttock & posterior thigh) Pain Descriptors /  Indicators: Aching Patients Stated Pain Goal: 6 Pain Intervention(s): Repositioned (premedicated)  See Function Navigator for Current Functional Status.   Therapy/Group: Individual Therapy  Michaela A Hoffman 04/17/2016, 2:50 PM

## 2016-04-17 NOTE — Plan of Care (Signed)
Problem: Food- and Nutrition-Related Knowledge Deficit (NB-1.1) Goal: Nutrition education Formal process to instruct or train a patient/client in a skill or to impart knowledge to help patients/clients voluntarily manage or modify food choices and eating behavior to maintain or improve health. Outcome: Completed/Met Date Met:  04/17/16  RD consulted for nutrition education regarding diabetes.     Lab Results  Component Value Date    HGBA1C 6.0* 03/31/2016    RD provided "Carbohydrate Counting for People with Diabetes" handout from the Academy of [employer] and Dietetics. Discussed different food groups and their effects on blood sugar, emphasizing carbohydrate-containing foods. Provided list of carbohydrates and recommended serving sizes of common foods.  Discussed importance of controlled and consistent carbohydrate intake throughout the day. Provided examples of ways to balance meals/snacks and encouraged intake of high-fiber, whole grain complex carbohydrates. Additionally discussed salt free seasonings. Teach back method used.  Expect good compliance.  Body mass index is 46.89 kg/(m^2). Pt meets criteria for morbid obesity based on current BMI.  Current diet order is carbohydrate modified/low sodium, patient is consuming approximately 100% of meals at this time. Labs and medications reviewed. No further nutrition interventions warranted at this time. RD contact information provided. If additional nutrition issues arise, please re-consult RD.  Corrin Parker, MS, RD, LDN Pager # [phone] After hours/ weekend pager # [phone]

## 2016-04-17 NOTE — Progress Notes (Signed)
Physical Therapy Session Note  Patient Details  Name: Sheri Baker MRN: [id number] Date of Birth: 01-Nov-1950  Today's Date: 04/17/2016 PT Individual Time: 1303-1400 PT Individual Time Calculation (min): 57 min   Short Term Goals: Week 3:  PT Short Term Goal 1 (Week 3): STG = LTG due to estimated d/c date.  Skilled Therapeutic Interventions/Progress Updates:    Pt seen for Grad day. Pt received in recliner & agreeable to PT, noting 5/10 back/L thigh pain but premedicated. Performed manual testing & provided education regarding d/c. Encouraged pt to ambulate as much as possible and to not use motorized carts in stores. Gait training x 250 ft room>ortho gym with RW & mod I. Pt able to complete car transfers with mod I from sedan simulated height. Pt able to negotiate ramp with RW & mod I, but required supervision for uneven surface (mulch) & curb with RW. Pt able to demonstrate bed mobility & negotiate 12 steps with either 1 rail or B rails with Mod I. Pt retrieved object from floor with mod I & use of reacher. Pt able to recall all back precautions independently with PT reinforcing that pt should not pick up anything over 5 lbs & pt voiced understanding. Gait training x 300 ft back to room with RW & Mod I. At end of session pt left in recliner with all needs within reach. Educated pt to remove all throw rugs/objects in floor to prevent tripping hazard & pt voiced understanding. Pt made Mod I in room & educated on meaning of this.  Therapy Documentation Precautions:  Precautions Precautions: Back Precaution Booklet Issued: Yes (comment) Precaution Comments: Able to recall 3/3 Required Braces or Orthoses: Spinal Brace Spinal Brace: Lumbar corset Restrictions Weight Bearing Restrictions: No  Pain: Pain Assessment Pain Assessment: 0-10 Pain Score: 5  Pain Location:  (L buttock & posterior thigh) Pain Intervention(s): Ambulation/increased activity (premedicated)  See Function Navigator for  Current Functional Status.   Therapy/Group: Individual Therapy  [name] 04/17/2016, 7:47 AM

## 2016-04-17 NOTE — Progress Notes (Addendum)
[facility] PHYSICAL [facility]ICINE & REHABILITATION     PROGRESS NOTE    Subjective/Complaints: Pt sitting up in her chair.  She states she slept better last night, but notes pain upon waking up.    ROS: + Left lower extremity and buttock pain. Denies SOB, N/V/D.  Objective: Vital Signs: Blood pressure 144/78, pulse 101, temperature 98 F (36.7 C), temperature source Oral, resp. rate 20, height 5\' 5"  (1.651 m), weight 127.8 kg (281 lb 12 oz), SpO2 98 %. Dg Lumbar Spine Complete  04/16/2016  CLINICAL DATA:  Pain.  Prior back surgery 03/27/2016. EXAM: LUMBAR SPINE - COMPLETE 4+ VIEW COMPARISON:  03/27/2016. FINDINGS: Prior L4 through S1 posterior interbody fusion. Hardware appears to be stable. Good anatomic alignment noted. Prior lower lumbar laminectomy. Diffuse degenerative change. No acute bony abnormality . IMPRESSION: Stable postsurgical changes of posterior L4 through S1 and interbody fusion. No acute abnormality. Electronically Signed   By: Marcello Moores  Register   On: 04/16/2016 13:25    Recent Labs  04/16/16 1531 04/17/16 0630  WBC 7.5 6.6  HGB 11.0* 10.3*  HCT 35.4* 33.6*  PLT 180 201   No results for input(s): NA, K, CL, GLUCOSE, BUN, CREATININE, CALCIUM in the last 72 hours.  Invalid input(s): CO CBG (last 3)   Recent Labs  04/16/16 1639 04/16/16 2033 04/17/16 0646  GLUCAP 122* 129* 104*    Wt Readings from Last 3 Encounters:  04/17/16 127.8 kg (281 lb 12 oz)  03/27/16 138.71 kg (305 lb 12.8 oz)  03/20/16 129 kg (284 lb 6.3 oz)    Physical Exam:  Constitutional: She appears well-developed and well-nourished. No distress. Morbidly obese  HENT: Normocephalic and atraumatic.  Eyes: Conjunctivae and Conj are normal.   Cardiovascular: Regular rate and rhythm. No murmur heard. Respiratory: Effort normal. No stridor. She has no wheezes.  GI: Soft. Bowel sounds are normal. There is no tenderness.  Musculoskeletal: She exhibits edema. Trace to 1+ pretibial edema   Neurological: She is alert and oriented.  B/l UE strength 4+/5.  RLE: 4-/5 proximally, 4+/5 distally LLE: 4/5 proximally, 4+/5 distally Skin: Skin is warm and dry. She is not diaphoretic.  Lower back incision c/d/i. Psychiatric: Anxious. Her speech is normal. Judgment and thought content normal. Cognition and memory are normal.   Assessment/Plan: 1. Functional and mobility deficits secondary to lumbar spondylolisthesis/radiculopathy which require 3+ hours per day of interdisciplinary therapy in a comprehensive inpatient rehab setting. Physiatrist is providing close team supervision and 24 hour management of active medical problems listed below. Physiatrist and rehab team continue to assess barriers to discharge/monitor patient progress toward functional and medical goals.  Function:  Bathing Bathing position   Position: Shower  Bathing parts Body parts bathed by patient: Right arm, Left arm, Chest, Abdomen, Front perineal area, Buttocks, Right upper leg, Left upper leg, Right lower leg, Left lower leg, Back Body parts bathed by helper: Buttocks  Bathing assist Assist Level: Assistive device, Set up   Set up : To obtain items  Upper Body Dressing/Undressing Upper body dressing   What is the patient wearing?: Pull over shirt/dress, Orthosis Bra - Perfomed by patient: Hook/unhook bra (pull down sports bra), Thread/unthread left bra strap Bra - Perfomed by helper: Thread/unthread right bra strap Pull over shirt/dress - Perfomed by patient: Thread/unthread right sleeve, Thread/unthread left sleeve, Put head through opening, Pull shirt over trunk       Orthosis activity level: Performed by patient  Upper body assist Assist Level: Set up  Set up : To obtain clothing/put away  Lower Body Dressing/Undressing Lower body dressing   What is the patient wearing?: Pants, Non-skid slipper socks, Ted Hose Underwear - Performed by patient: Thread/unthread right underwear leg, Thread/unthread  left underwear leg, Pull underwear up/down   Pants- Performed by patient: Thread/unthread right pants leg, Thread/unthread left pants leg, Pull pants up/down (using reacher) Pants- Performed by helper: Pull pants up/down Non-skid slipper socks- Performed by patient: Don/doff right sock, Don/doff left sock (using sock aid) Non-skid slipper socks- Performed by helper: Don/doff right sock, Don/doff left sock (due to time constraints)               TED Hose - Performed by helper: Don/doff right TED hose, Don/doff left TED hose  Lower body assist Assist for lower body dressing: Supervision or verbal cues Assistive Device Comment: AE to increase independence    Toileting Toileting   Toileting steps completed by patient: Adjust clothing prior to toileting, Performs perineal hygiene, Adjust clothing after toileting Toileting steps completed by helper: Performs perineal hygiene Toileting Assistive Devices: Grab bar or rail  Toileting assist Assist level: Supervision or verbal cues   Transfers Chair/bed transfer   Chair/bed transfer method: Ambulatory Chair/bed transfer assist level: Supervision or verbal cues Chair/bed transfer assistive device: Environmental consultant, Orthosis     Locomotion Ambulation     Max distance: 180 Assist level: Supervision or verbal cues   Wheelchair   Type: Manual Max wheelchair distance: 200 Assist Level: Supervision or verbal cues  Cognition Comprehension Comprehension assist level: Follows complex conversation/direction with no assist  Expression Expression assist level: Expresses complex ideas: With no assist  Social Interaction Social Interaction assist level: Interacts appropriately with others - No medications needed.  Problem Solving Problem solving assist level: Solves complex problems: Recognizes & self-corrects  Memory Memory assist level: Complete Independence: No helper   Medical Problem List and Plan: 1. Functional and mobility deficits secondary to  lumbar spondlylolisthesis and radiculopathy, s/p lami and decompression  -Continue CIR  -Xrays ordered on 6/26, reviewed, stable post-surgical changes 2. DVT Prophylaxis/Anticoagulation: Mechanical: Sequential compression devices, below knee Bilateral lower extremities   CTA bilateral PE  Xarelto started on 6/19. Temporarily DC'd, however after conversation with heme/onc resumed.  Will continue to monitor platelets, which cont to improve 3. Pain Management: Changed hydrocodone back to oxycodone prn. Scheduled tramadol for better pain control.   -robaxin helpful for pain-  750mg  qid scheduled  -utilizing ice/heat also  -Gabapentin increased to 600 3 times a day at 6/13, increased to 900 on 6/22   -OxyContin 10 mg twice a day started on 6/14.   -Added lidoderm patch on 6/20  -Tizanidine 2 mg TID started on 6/26  -Lidoderm patch to buttock ordered on 6/27 4. Mood: Team to provide ego support. LCSW to follow for evaluation and support.   Klonipin 0.5 qHS started on 6/26, much of pain may be related to anxiety.  Lexapro started 6/27 5. Neuropsych: This patient is capable of making decisions on her own behalf. 6. Skin/Wound Care: Monitor wound daily for healing. Maintain adequate nutritional and hydration status.  7. Fluids/Electrolytes/Nutrition: Cont I/Os  Mild hyponatremia: 136 on 6/23.   8. T2DM--diet controlled: tight control at present  9. HTN: Monitor BP. On aldactone/HCTZ.  Cozaar--continue to hold. Encourage fluid intake.  10 ABLA:  Added iron supplement.    Hemoglobin 10.3 on 6/27  11. Leucocytosis: Resolved  No fevers or other signs of infection.   WBCs within normal limits  UA with few bacteria, U culture with multiple species  Will continue to monitor 12. Constipation:  Increased bowel meds on 6/12, with results 13. B/l PE  Xarelto 14. Thrombocytopenia: resolved   Platelets 201 on 6/27  LOS (Days) 18 A FACE TO FACE EVALUATION WAS PERFORMED  Ankit [name] 04/17/2016 10:26 AM

## 2016-04-17 NOTE — Progress Notes (Signed)
Physical Therapy Session Note  Patient Details  Name: Sheri Baker MRN: [id number] Date of Birth: 12/15/1950  Today's Date: 04/17/2016 PT Individual Time: 1005-1035 PT Individual Time Calculation (min): 30 min   Short Term Goals: Week 3:  PT Short Term Goal 1 (Week 3): STG = LTG due to estimated d/c date.  Skilled Therapeutic Interventions/Progress Updates:    Patient in w/c performed sit to stand S and ambulated with S with RW 185'.  Seated in chair for rest discussed positioning for sitting with back precautions and brace.  Patient negotiated 4 steps with L rail sideways with step to sequence with minguard A.  Sit to side on mat and roll to supine unaided (mod I).  Return to sitting with precautions mod I.  Patient able to relate sequence for car transfer with min cues.  Patient ambulated back to room with RW and supervision.  Toileted with S.  Returned to recliner with all needs in reach.   Therapy Documentation Precautions:  Precautions Precautions: Back Precaution Booklet Issued: Yes (comment) Precaution Comments: Able to recall 3/3 Required Braces or Orthoses: Spinal Brace Spinal Brace: Lumbar corset Restrictions Weight Bearing Restrictions: No Pain: Pain Assessment Pain Score: Asleep Pain Type: Acute pain;Post Delivery (post partum) Pain Location: Back Pain Orientation: Right;Left Pain Radiating Towards: legs Pain Descriptors / Indicators: Aching Patients Stated Pain Goal: 6 Pain Intervention(s): Medication (See eMAR)   See Function Navigator for Current Functional Status.   Therapy/Group: Individual Therapy  [place], [place] 04/17/2016  04/17/2016, 11:09 AM

## 2016-04-17 NOTE — Patient Care Conference (Signed)
Inpatient RehabilitationTeam Conference and Plan of Care Update Date: 04/17/2016   Time: 10:55 AM    Patient Name: Sheri Baker      Medical Record Number: [id number]  Date of Birth: [birth year] Sex: Female         Room/Bed: 4W24C/4W24C-01 Payor Info: Payor: [place] / Plan: [employer] OTHER / Product Type: *No Product type* /    Admitting Diagnosis: Lumber Decompression Admit  Admit Date/Time:  03/30/2016  5:24 PM Admission Comments: No comment available   Primary Diagnosis:  Spinal stenosis of lumbar region with radiculopathy Principal Problem: Spinal stenosis of lumbar region with radiculopathy  Patient Active Problem List   Diagnosis Date Noted  . Pain   . Muscle cramping   . Thrombocytopenia ([place])   . Pulmonary embolism without acute cor pulmonale ([place])   . Anxiety state   . Chest pain   . Pain in rib   . Post-operative pain   . Neuropathic pain of both legs   . Leukocytosis   . Hyponatremia   . Acute blood loss anemia   . Benign essential HTN   . Slow transit constipation   . Diabetes mellitus type 2 in obese ([place])   . Spinal stenosis of lumbar region with radiculopathy 03/30/2016  . Spondylolisthesis of lumbar region 03/27/2016    Expected Discharge Date: Expected Discharge Date: 04/18/16  Team Members Present: Physician leading conference: Dr. Delice Lesch Social Worker Present: Ovidio Kin, LCSW Nurse Present: Dorthula Nettles, RN PT Present: Lavone Nian, PT OT Present: Willeen Cass, OT PPS Coordinator present : Daiva Nakayama, RN, CRRN     Current Status/Progress Goal Weekly Team Focus  Medical   Functional and mobility deficits secondary to lumbar spondlylolisthesis and radiculopathy, s/p lami and decompression, radiculopathy, PE  Improve pain, safety, mobility, transferse  see above   Bowel/Bladder   Cont of bowel/bladder; LBM 6/26  Remain cont of bladder/bowel  Monitor bladder/bowel function during every shift   Swallow/Nutrition/ Hydration             ADL's   Mod I UB/LB bathing while seated in shower; supervision for UB dressing for 1 safety cue for lumbar corset; Mod I LB dressing with use of AE. Mod I toilet and tub bench shower transfers.   Mod I UB dressing, Mod I simple meal prep, Mod I laundry/housekeeping tasks   Caregiver education    Mobility   supervision for transfers, supervision for ambulation with RW >300 ft, supervision for standing balance  supervision for transfers, ambulation & standing balance  endurance training, balance training, stair negotiation, family education in preparation for d/c.   Communication             Safety/Cognition/ Behavioral Observations            Pain   C/o back pain and rib cage, Sch OxyContin q12hrs and Oxy IR 44m prn q4hrs, Sch Robaxin [id number] Zanaflex [id number]  <3  Assess and treat pain during shift as needed   Skin   Back incision lower-skin glue and OTA   No skin breakdown/infection   Assess skin q shift and as needed      *See Care Plan and progress notes for long and short-term goals.  Barriers to Discharge: pain, mobility, saftey    Possible Resolutions to Barriers:  optimize pain meds, therapies, edu    Discharge Planning/Teaching Needs:  Sister and brother coming in tomorrow to do family trianing prior to discharge, sister to stay with 1-2  weeks.       Team Discussion:  Goals being met, made mod/i in room today in anticipation of discharge tomorrow. Family training tomorrow prior to discharge home. Off O2 now. Pain in thigh and sacrum x-ray negative. Some anxiety issues medicated.  Revisions to Treatment Plan:  DC tomorrow   Continued Need for Acute Rehabilitation Level of Care: The patient requires daily medical management by a physician with specialized training in physical medicine and rehabilitation for the following conditions: Daily direction of a multidisciplinary physical rehabilitation program to ensure safe treatment while eliciting the highest  outcome that is of practical value to the patient.: Yes Daily medical management of patient stability for increased activity during participation in an intensive rehabilitation regime.: Yes Daily analysis of laboratory values and/or radiology reports with any subsequent need for medication adjustment of medical intervention for : Post surgical problems;Neurological problems;Other;Mood/behavior problems;Pulmonary problems  [name] 04/18/2016, 8:24 AM

## 2016-04-17 NOTE — Progress Notes (Signed)
Occupational Therapy Discharge Summary  Patient Details  Name: Sheri Baker MRN: [id number] Date of Birth: 1951/04/27   Patient has met 11/11 long term goals due to improved balance, postural control, ability to compensate for deficits and improved awareness.  Pt has improved overall ADL abilities from time of evaluation, going from completing with CGA-Max A and requiring total A for toilet transfers, to now completing ADLs and functional transfers with overall Mod I. Pt now demonstrates appropriate and safe use of AE during self care while adhering consistently to back precautions. Pts resting pain level has decreased since time of evaluation, and now maintains stable 02 sats on room air. Caregiver education has been provided for ADL/IADL completion with verbalized understanding. DME and AE needs have been assessed and ordered. Patient to discharge at overall Modified Independent level.  Patient's care partner is independent to provide the necessary cognitive assistance at discharge after completing education from CIR.     Recommendation:  Patient will benefit from ongoing skilled OT services in home health to continue to advance functional skills in the area of IADL completion and home modifications to maximize safety during life activities.   Equipment: 3 in 1 commode; sock aide/reacher/LH sponge/toilet aide/tub bench    Reasons for discharge: treatment goals met  Patient/family agrees with progress made and goals achieved: Yes  OT Discharge Precautions/Restrictions  Precautions Precautions: Back Precaution Booklet Issued: Yes (comment) Precaution Comments:  (Able to recall 3/3) Required Braces or Orthoses: Spinal Brace Spinal Brace: Lumbar corset Restrictions Weight Bearing Restrictions: No   Pain No c/o pain during session  Pain Assessment Pain Score: Asleep Pain Type: Acute pain;Post Delivery (post partum) Pain Location: Back Pain Descriptors / Indicators: Aching Patients  Stated Pain Goal: 6 Pain Intervention(s): Medication (See eMAR) ADL ADL Equipment Provided: Reacher, Sock aid, Long-handled sponge Grooming: Modified independent Where Assessed-Grooming: Wheelchair Upper Body Bathing: Modified independent Where Assessed-Upper Body Bathing: Shower Lower Body Bathing: Modified independent Where Assessed-Lower Body Bathing: Shower Upper Body Dressing: Supervision/safety Where Assessed-Upper Body Dressing: Other (Comment) (at edge of tub bench) Lower Body Dressing: Modified independent Where Assessed-Lower Body Dressing: Other (Comment) (completed at edge of tub bench standing with RW) Toileting: Supervision/safety Where Assessed-Toileting: Glass blower/designer: Distant supervision Armed forces technical officer Method: Counselling psychologist: Grab bars, Raised toilet seat Tub/Shower Transfer Method: Optometrist: Radio broadcast assistant, Grab bars ADL Comments: see Assessment Tool Vision/Perception  Vision- History Baseline Vision/History: Wears glasses Wears Glasses: Reading only Patient Visual Report: No change from baseline Vision- Assessment Vision Assessment?: No apparent visual deficits  Cognition Overall Cognitive Status: Within Functional Limits for tasks assessed Arousal/Alertness: Awake/alert Orientation Level: Oriented X4 Attention: Focused;Sustained Focused Attention: Appears intact Sustained Attention: Appears intact Memory: Appears intact Awareness: Appears intact Problem Solving: Appears intact Executive Function: Reasoning;Sequencing;Organizing;Decision Making;Initiating Reasoning: Appears intact Sequencing: Appears intact Organizing: Appears intact Decision Making: Appears intact Initiating: Appears intact Safety/Judgment: Appears intact Sensation Sensation Light Touch: Appears Intact Stereognosis: Appears Intact Hot/Cold: Appears Intact Proprioception: Appears Intact Coordination Gross Motor  Movements are Fluid and Coordinated: Yes Fine Motor Movements are Fluid and Coordinated: Yes Motor  Motor Motor: Within Functional Limits Mobility  Transfers Sit to Stand: 6: Modified independent (Device/Increase time) Stand to Sit: 6: Modified independent (Device/Increase time)  Trunk/Postural Assessment  Cervical Assessment Cervical Assessment: Within Functional Limits Thoracic Assessment Thoracic Assessment: Exceptions to [facility] Lumbar Assessment Lumbar Assessment: Exceptions to [facility] Postural Control Postural Control: Within Functional Limits  Balance   Extremity/Trunk Assessment RUE Assessment RUE Assessment: Within Functional  Limits LUE Assessment LUE Assessment: Within Functional Limits   See Function Navigator for Current Functional Status.  Michaela A Hoffman 04/17/2016, 12:24 PM

## 2016-04-17 NOTE — Plan of Care (Signed)
Problem: RH Balance Goal: LTG Patient will maintain dynamic standing balance (PT) LTG: Patient will maintain dynamic standing balance with assistance during mobility activities (PT)  Outcome: Completed/Met Date Met:  04/17/16 With RW  Problem: RH Bed to Chair Transfers Goal: LTG Patient will perform bed/chair transfers w/assist (PT) LTG: Patient will perform bed/chair transfers with assistance, with/without cues (PT).  Outcome: Completed/Met Date Met:  04/17/16 With RW  Problem: RH Car Transfers Goal: LTG Patient will perform car transfers with assist (PT) LTG: Patient will perform car transfers with assistance (PT).  Outcome: Completed/Met Date Met:  04/17/16 With RW  Problem: RH Furniture Transfers Goal: LTG Patient will perform furniture transfers w/assist (OT/PT LTG: Patient will perform furniture transfers with assistance (OT/PT).  Outcome: Completed/Met Date Met:  04/17/16 With RW  Problem: RH Ambulation Goal: LTG Patient will ambulate in controlled environment (PT) LTG: Patient will ambulate in a controlled environment, # of feet with assistance (PT).  Outcome: Completed/Met Date Met:  04/17/16 150 ft with RW Goal: LTG Patient will ambulate in home environment (PT) LTG: Patient will ambulate in home environment, # of feet with assistance (PT).  Outcome: Completed/Met Date Met:  04/17/16 50 ft with RW  Problem: RH Wheelchair Mobility Goal: LTG Patient will propel w/c in controlled environment (PT) LTG: Patient will propel wheelchair in controlled environment, # of feet with assist (PT)  Outcome: Adequate for Discharge Pt to d/c home at ambulatory level  Problem: RH Stairs Goal: LTG Patient will ambulate up and down stairs w/assist (PT) LTG: Patient will ambulate up and down # of stairs with assistance (PT)  Outcome: Completed/Met Date Met:  04/17/16 12 steps with 1 or 2 rails

## 2016-04-17 NOTE — Progress Notes (Signed)
Physical Therapy Discharge Summary  Patient Details  Name: Sheri Baker MRN: [id number] Date of Birth: 17-Apr-1951  Today's Date: 04/17/2016 PT Individual Time: 8466-5993 PT Individual Time Calculation (min): 46 min    Patient has met 10 of 10 long term goals due to improved activity tolerance, improved balance, improved postural control, increased strength, decreased pain and improved awareness.  Patient to discharge at an ambulatory level Modified Independent.   Patient's care partner is independent to provide the necessary cognitive assistance at discharge.  Reasons goals not met: n/a  Recommendation:  Patient will benefit from ongoing skilled PT services in home health setting to continue to advance safe functional mobility, address ongoing impairments in decreased standing balance without AD, decreased endurance, continued back, buttocks & leg pain, decreased strength (LLE>RLE), and minimize fall risk.  Equipment: RW, 3-in-1 BSC, HHPT  Reasons for discharge: treatment goals met  Patient/family agrees with progress made and goals achieved: Yes  Skilled PT treatment: Pt received in w/c with sister & brother present for family training. Pt noted continued L thigh/buttock pain. Educated pt & family on back precautions, need to don brace in sitting, and need for clearance by MD to discontinue these. Discussed need for pt to increase endurance/activity tolerance & standing tolerance but to have chair or family member nearby in case pt needs to sit. Educated pt to continue to ambulate throughout home, but to remove all rugs & various tripping hazards. Instructed pt to not ambulate in gravel driveway until she has attempted this with HHPT. Pt able to ambulate room>ortho gym & negotiate ramp with RW & Mod I. Pt required supervision for negotiating uneven terrain (mulch) & curb with RW. At end of session pt returned to room & left in w/c set up for bathing at sink.   PT  Discharge Precautions/Restrictions Precautions Precautions: Back Precaution Comments: TLSO donned in sitting Required Braces or Orthoses: Spinal Brace Spinal Brace: Lumbar corset Restrictions Weight Bearing Restrictions: No  Pain Pain Assessment Pain Assessment: 0-10 Pain Score: 5  Pain Type:  (L buttock & posterior thigh) Pain Location:  (L buttock & posterior thigh) Pain Descriptors / Indicators: Aching Pain Intervention(s): Repositioned  Vision/Perception    Pt reports she wears glasses at baseline for reading only. Per patient her vision has improved since admission to hospital.  Cognition Overall Cognitive Status: Within Functional Limits for tasks assessed Arousal/Alertness: Awake/alert Orientation Level: Oriented X4;Oriented to person;Oriented to place;Oriented to time;Oriented to situation Attention: Focused Focused Attention: Appears intact Sustained Attention: Appears intact Memory: Appears intact Awareness: Appears intact Problem Solving: Appears intact Executive Function: Reasoning;Sequencing;Organizing;Decision Making;Initiating Reasoning: Appears intact Sequencing: Appears intact Organizing: Appears intact Decision Making: Appears intact Initiating: Appears intact Safety/Judgment: Appears intact  Sensation Sensation Light Touch: Appears Intact (BLE) Proprioception: Appears Intact (BLE) Additional Comments:  (pt reports numbness in posterior thights bilaterally) Coordination Gross Motor Movements are Fluid and Coordinated: Yes Heel Shin Test:  (BLE equal)  Motor  Motor Motor: Within Functional Limits Motor - Skilled Clinical Observations:  (overall improved strength in BLE)   Mobility Bed Mobility Bed Mobility: Rolling Right;Rolling Left;Sit to Supine;Supine to Sit Rolling Right: 6: Modified independent (Device/Increase time) Rolling Left: 6: Modified independent (Device/Increase time) Supine to Sit: 6: Modified independent (Device/Increase  time) Sit to Supine: 6: Modified independent (Device/Increase time) Transfers Transfers: Yes Sit to Stand: 6: Modified independent (Device/Increase time) Stand to Sit: 6: Modified independent (Device/Increase time) Stand Pivot Transfers: 6: Modified independent (Device/Increase time)  Locomotion  Ambulation Ambulation: Yes Ambulation/Gait Assistance:  6: Modified independent (Device/Increase time) Ambulation Distance (Feet): 250 Feet Assistive device: Rolling walker Gait Gait: Yes Gait Pattern: Decreased step length - right;Step-through pattern (decreased gait speed) Stairs / Additional Locomotion Stairs: Yes Stairs Assistance: 6: Modified independent (Device/Increase time) Stair Management Technique: One rail Left;Two rails Number of Stairs: 12 Height of Stairs: 6 (inches) Ramp: 6: Modified independent (Device) Curb: 5: Psychiatric nurse: No   Event organiser Standing - Level of Assistance: 6: Modified independent (Device/Increase time) (with RW)   Extremity Assessment  RLE AROM (degrees) Overall AROM Right Lower Extremity: Within functional limits for tasks assessed RLE Strength Right Hip Flexion: 3+/5 Right Knee Flexion: 4+/5 Right Knee Extension: 4+/5 Right Ankle Dorsiflexion: 4+/5 LLE AROM (degrees) Overall AROM Left Lower Extremity: Within functional limits for tasks assessed LLE Strength Left Hip Flexion: 3+/5 Left Knee Flexion: 3+/5 Left Knee Extension: 3/5 Left Ankle Dorsiflexion: 3+/5   See Function Navigator for Current Functional Status.  [name] 04/17/2016, 1:15 PM

## 2016-04-18 ENCOUNTER — Encounter (HOSPITAL_COMMUNITY): Payer: BLUE CROSS/BLUE SHIELD | Admitting: Occupational Therapy

## 2016-04-18 ENCOUNTER — Inpatient Hospital Stay (HOSPITAL_COMMUNITY): Payer: BLUE CROSS/BLUE SHIELD | Admitting: Physical Therapy

## 2016-04-18 LAB — CBC WITH DIFFERENTIAL/PLATELET
Basophils Absolute: 0 10*3/uL (ref 0.0–0.1)
Basophils Relative: 0 %
Eosinophils Absolute: 0.2 10*3/uL (ref 0.0–0.7)
Eosinophils Relative: 3 %
HCT: 32.4 % — ABNORMAL LOW (ref 36.0–46.0)
Hemoglobin: 10 g/dL — ABNORMAL LOW (ref 12.0–15.0)
Lymphocytes Relative: 38 %
Lymphs Abs: 2.1 10*3/uL (ref 0.7–4.0)
MCH: 25.7 pg — ABNORMAL LOW (ref 26.0–34.0)
MCHC: 30.9 g/dL (ref 30.0–36.0)
MCV: 83.3 fL (ref 78.0–100.0)
Monocytes Absolute: 0.5 10*3/uL (ref 0.1–1.0)
Monocytes Relative: 9 %
Neutro Abs: 2.8 10*3/uL (ref 1.7–7.7)
Neutrophils Relative %: 50 %
Platelets: 195 10*3/uL (ref 150–400)
RBC: 3.89 MIL/uL (ref 3.87–5.11)
RDW: 14 % (ref 11.5–15.5)
WBC: 5.6 10*3/uL (ref 4.0–10.5)

## 2016-04-18 LAB — GLUCOSE, CAPILLARY
Glucose-Capillary: 105 mg/dL — ABNORMAL HIGH (ref 65–99)
Glucose-Capillary: 112 mg/dL — ABNORMAL HIGH (ref 65–99)

## 2016-04-18 MED ORDER — CLONAZEPAM 0.5 MG PO TABS: 0.5000 mg | ORAL_TABLET | Freq: Every day | ORAL | Status: DC

## 2016-04-18 MED ORDER — OXYCODONE HCL 5 MG PO TABS: 5.0000 mg | ORAL_TABLET | Freq: Four times a day (QID) | ORAL | Status: DC | PRN

## 2016-04-18 MED ORDER — OXYCODONE HCL ER 10 MG PO T12A: EXTENDED_RELEASE_TABLET | ORAL | Status: DC

## 2016-04-18 MED ORDER — ESCITALOPRAM OXALATE 5 MG PO TABS: 5.0000 mg | ORAL_TABLET | Freq: Every day | ORAL | Status: DC

## 2016-04-18 MED ORDER — METHOCARBAMOL 750 MG PO TABS: 750.0000 mg | ORAL_TABLET | Freq: Four times a day (QID) | ORAL | Status: DC

## 2016-04-18 MED ORDER — GABAPENTIN 400 MG PO CAPS: 1200.0000 mg | ORAL_CAPSULE | Freq: Three times a day (TID) | ORAL | Status: DC

## 2016-04-18 MED ORDER — TRAMADOL HCL 50 MG PO TABS: 50.0000 mg | ORAL_TABLET | Freq: Three times a day (TID) | ORAL | Status: DC

## 2016-04-18 MED ORDER — RIVAROXABAN 20 MG PO TABS: 20.0000 mg | ORAL_TABLET | Freq: Every day | ORAL | Status: DC

## 2016-04-18 MED ORDER — GABAPENTIN 400 MG PO CAPS: ORAL_CAPSULE | ORAL | Status: DC

## 2016-04-18 MED ORDER — GABAPENTIN 300 MG PO CAPS: ORAL_CAPSULE | ORAL | Status: DC

## 2016-04-18 MED ORDER — POLYSACCHARIDE IRON COMPLEX 150 MG PO CAPS: 150.0000 mg | ORAL_CAPSULE | Freq: Two times a day (BID) | ORAL | Status: DC

## 2016-04-18 MED ORDER — SENNOSIDES-DOCUSATE SODIUM 8.6-50 MG PO TABS: 2.0000 | ORAL_TABLET | Freq: Two times a day (BID) | ORAL | Status: DC

## 2016-04-18 MED ORDER — TIZANIDINE HCL 2 MG PO TABS: 2.0000 mg | ORAL_TABLET | Freq: Three times a day (TID) | ORAL | Status: DC

## 2016-04-18 MED ORDER — LIDOCAINE 5 % EX PTCH: MEDICATED_PATCH | CUTANEOUS | Status: DC

## 2016-04-18 MED ORDER — BLOOD GLUCOSE MONITOR KIT: PACK | Status: DC

## 2016-04-18 MED ORDER — RIVAROXABAN 15 MG PO TABS: 15.0000 mg | ORAL_TABLET | Freq: Two times a day (BID) | ORAL | Status: DC

## 2016-04-18 NOTE — Progress Notes (Signed)
Occupational Therapy Session Note  Patient Details  Name: Sheri Baker MRN: [id number] Date of Birth: 1951-09-23  Today's Date: 04/18/2016 OT Individual Time:  - 0900-0944   44 minutes       Skilled Therapeutic Interventions/Progress Updates:    Caregiver and family education provided for discharge planning. Education provided on proper technique of donning lumbar corset with visual demonstration, as well as proper use and placement of AE in bathroom and technique of adjusting height of three in one commode. Visual demonstration provided on proper shower transfer in tub shower room with open discussion on home bathroom setup with safe solutions established for showering in small bathroom. Family verbalized understanding with method of adjusting height and orientation of tub bench. Education was also provided for kitchen modifications and laundry completion. Per caregiver report, pt will be receiving assistance in these IADL areas for two weeks and will "ease" pt into becoming more independent in tasks. Per report, pt has multiple neighbors and church friends that can assist with household management tasks as needed. Questioning cues were provided for pt to actively problem solve in a hypothetical fall scenario. Pt verbalized understanding via teach back method and family verbalized understanding. Pt reported wanting to bathe today and nurse tech notified. Skilled OT instructed pt to notify nursing after skilled PT session for providing distant supervision/setup while bathing in room. Pt left in w/c with family present and all needs within reach, TED stockings donned. Family reported being appreciative of handouts and verbal instruction and support.   Therapy Documentation Precautions:  Precautions Precautions: Back Precaution Booklet Issued: Yes (comment) Precaution Comments: TLSO donned in sitting Required Braces or Orthoses: Spinal Brace Spinal Brace: Lumbar corset Restrictions Weight Bearing  Restrictions: No  ADL Equipment Provided: Reacher, Sock aid, Long-handled sponge Grooming: Modified independent Where Assessed-Grooming: Wheelchair Upper Body Bathing: Modified independent Where Assessed-Upper Body Bathing: Shower Lower Body Bathing: Modified independent Where Assessed-Lower Body Bathing: Shower Upper Body Dressing: Supervision/safety Where Assessed-Upper Body Dressing: Other (Comment) (at edge of tub bench) Lower Body Dressing: Modified independent Where Assessed-Lower Body Dressing: Other (Comment) (completed at edge of tub bench standing with RW) Toileting: Supervision/safety Where Assessed-Toileting: Glass blower/designer: Distant supervision Armed forces technical officer Method: Counselling psychologist: Grab bars, Raised toilet seat Tub/Shower Transfer Method: Optometrist: Radio broadcast assistant, Grab bars ADL Comments: see Assessment Tool  See Function Navigator for Current Functional Status.   Therapy/Group: Individual Therapy  Michaela A Hoffman 04/18/2016, 12:11 PM

## 2016-04-18 NOTE — Progress Notes (Signed)
[facility] PHYSICAL [facility]ICINE & REHABILITATION     PROGRESS NOTE    Subjective/Complaints: Pt laying in bed, stating she is pain when she wakes up in the morning, but is otherwise okay.  It has improved.  ROS: + Left lower extremity and buttock pain. Denies SOB, N/V/D.  Objective: Vital Signs: Blood pressure 103/74, pulse 98, temperature 98.4 F (36.9 C), temperature source Oral, resp. rate 20, height 5\' 5"  (1.651 m), weight 127.8 kg (281 lb 12 oz), SpO2 100 %. Dg Lumbar Spine Complete  04/16/2016  CLINICAL DATA:  Pain.  Prior back surgery 03/27/2016. EXAM: LUMBAR SPINE - COMPLETE 4+ VIEW COMPARISON:  03/27/2016. FINDINGS: Prior L4 through S1 posterior interbody fusion. Hardware appears to be stable. Good anatomic alignment noted. Prior lower lumbar laminectomy. Diffuse degenerative change. No acute bony abnormality . IMPRESSION: Stable postsurgical changes of posterior L4 through S1 and interbody fusion. No acute abnormality. Electronically Signed   By: Marcello Moores  Register   On: 04/16/2016 13:25    Recent Labs  04/17/16 0630 04/18/16 0656  WBC 6.6 5.6  HGB 10.3* 10.0*  HCT 33.6* 32.4*  PLT 201 195   No results for input(s): NA, K, CL, GLUCOSE, BUN, CREATININE, CALCIUM in the last 72 hours.  Invalid input(s): CO CBG (last 3)   Recent Labs  04/17/16 1625 04/17/16 2053 04/18/16 0647  GLUCAP 111* 104* 105*    Wt Readings from Last 3 Encounters:  04/17/16 127.8 kg (281 lb 12 oz)  03/27/16 138.71 kg (305 lb 12.8 oz)  03/20/16 129 kg (284 lb 6.3 oz)    Physical Exam:  Constitutional: She appears well-developed and well-nourished. No distress. Morbidly obese  HENT: Normocephalic and atraumatic.  Eyes: Conjunctivae and Conj are normal.   Cardiovascular: Regular rate and rhythm. No murmur heard. Respiratory: Effort normal. No stridor. She has no wheezes.  GI: Soft. Bowel sounds are normal. There is no tenderness.  Musculoskeletal: She exhibits edema. Trace to 1+ pretibial  edema  Neurological: She is alert and oriented.  B/l UE strength 4+/5.  RLE: 4/5 proximally, 4+/5 distally LLE: 4+/5 proximally, 4+/5 distally Skin: Skin is warm and dry. She is not diaphoretic.  Lower back incision c/d/i. Psychiatric: Anxious. Her speech is normal. Judgment and thought content normal. Cognition and memory are normal.   Assessment/Plan: 1. Functional and mobility deficits secondary to lumbar spondylolisthesis/radiculopathy which require 3+ hours per day of interdisciplinary therapy in a comprehensive inpatient rehab setting. Physiatrist is providing close team supervision and 24 hour management of active medical problems listed below. Physiatrist and rehab team continue to assess barriers to discharge/monitor patient progress toward functional and medical goals.  Function:  Bathing Bathing position   Position: Shower  Bathing parts Body parts bathed by patient: Right arm, Left arm, Chest, Front perineal area, Buttocks, Right upper leg, Left upper leg, Right lower leg, Left lower leg, Back Body parts bathed by helper: Buttocks  Bathing assist Assist Level: More than reasonable time, No help, No cues   Set up : To obtain items  Upper Body Dressing/Undressing Upper body dressing   What is the patient wearing?: Pull over shirt/dress, Orthosis Bra - Perfomed by patient: Hook/unhook bra (pull down sports bra), Thread/unthread left bra strap Bra - Perfomed by helper: Thread/unthread right bra strap Pull over shirt/dress - Perfomed by patient: Thread/unthread right sleeve, Thread/unthread left sleeve, Put head through opening, Pull shirt over trunk       Orthosis activity level: Performed by patient  Upper body assist Assist Level:  Supervision or verbal cues (1 cue for safety with donning orthosis prior to standing)   Set up : To obtain clothing/put away  Lower Body Dressing/Undressing Lower body dressing   What is the patient wearing?: Pants, Socks, [employer] - Performed by patient: Thread/unthread right underwear leg, Thread/unthread left underwear leg, Pull underwear up/down   Pants- Performed by patient: Thread/unthread right pants leg, Thread/unthread left pants leg, Pull pants up/down Pants- Performed by helper: Pull pants up/down Non-skid slipper socks- Performed by patient: Don/doff right sock, Don/doff left sock Non-skid slipper socks- Performed by helper: Don/doff right sock, Don/doff left sock (due to time constraints)               TED Hose - Performed by helper: Don/doff right TED hose, Don/doff left TED hose  Lower body assist Assist for lower body dressing: Assistive device Assistive Device Comment: AE as needed    Toileting Toileting   Toileting steps completed by patient: Adjust clothing prior to toileting, Performs perineal hygiene, Adjust clothing after toileting Toileting steps completed by helper: Performs perineal hygiene Toileting Assistive Devices: Grab bar or rail  Toileting assist Assist level: More than reasonable time   Transfers Chair/bed transfer   Chair/bed transfer method: Anterior/posterior Chair/bed transfer assist level: No Help, no cues, assistive device, takes more than a reasonable amount of time Chair/bed transfer assistive device: Medical sales representative     Max distance: 250 ft Assist level: No help, No cues, assistive device, takes more than a reasonable amount of time   Wheelchair Wheelchair activity did not occur: N/A Type: Manual Max wheelchair distance: 200 Assist Level: Supervision or verbal cues  Cognition Comprehension Comprehension assist level: Follows complex conversation/direction with extra time/assistive device  Expression Expression assist level: Expresses complex 90% of the time/cues < 10% of the time  Social Interaction Social Interaction assist level: Interacts appropriately 90% of the time - Needs monitoring or encouragement for participation or  interaction.  Problem Solving Problem solving assist level: Solves basic problems with no assist  Memory Memory assist level: Recognizes or recalls 90% of the time/requires cueing < 10% of the time   Medical Problem List and Plan: 1. Functional and mobility deficits secondary to lumbar spondlylolisthesis and radiculopathy, s/p lami and decompression  -D/C today  -Xrays ordered on 6/26, reviewed, stable post-surgical changes 2. DVT Prophylaxis/Anticoagulation: Mechanical: Sequential compression devices, below knee Bilateral lower extremities   CTA bilateral PE  Xarelto started on 6/19. Temporarily DC'd, however after conversation with heme/onc resumed.  Will continue to monitor platelets, which cont to improve 3. Pain Management: Changed hydrocodone back to oxycodone prn. Scheduled tramadol for better pain control.   -robaxin helpful for pain-  750mg  qid scheduled  -utilizing ice/heat also  -Gabapentin increased to 600 3 times a day at 6/13, increased to 900 on 6/22, increased to 1200 on 6/28   -OxyContin 10 mg twice a day started on 6/14.   -Added lidoderm patch on 6/20  -Tizanidine 2 mg TID started on 6/26  -Lidoderm patch to buttock ordered on 6/27 4. Mood: Team to provide ego support. LCSW to follow for evaluation and support.   Klonipin 0.5 qHS started on 6/26, much of pain may be related to anxiety.  Lexapro started 6/27 5. Neuropsych: This patient is capable of making decisions on her own behalf. 6. Skin/Wound Care: Monitor wound daily for healing. Maintain adequate nutritional and hydration status.  7. Fluids/Electrolytes/Nutrition: Cont I/Os  Mild hyponatremia: 136 on  6/23.   8. T2DM--diet controlled: tight control at present  9. HTN: Monitor BP. On aldactone/HCTZ.  Cozaar--continue to hold. Encourage fluid intake.  10 ABLA:  Added iron supplement.    Hemoglobin 10.0 on 6/28  11. Leucocytosis: Resolved  No fevers or other signs of infection.   WBCs within normal  limits  UA with few bacteria, U culture with multiple species 12. Constipation:  Increased bowel meds on 6/12, with results 13. B/l PE  Xarelto 14. Thrombocytopenia: resolved   Platelets 195 on 6/28  LOS (Days) 19 A FACE TO FACE EVALUATION WAS PERFORMED  Ankit [name] 04/18/2016 9:35 AM

## 2016-04-18 NOTE — Progress Notes (Signed)
Pt discharge to home with sister. Discharge instructions given to pt and sister by Reesa Chew, PA with verbal understanding.  RN reviewed all medications and PRN mediations with sister and pt with verbal understanding,.

## 2016-04-18 NOTE — Progress Notes (Signed)
Recreational Therapy Discharge Summary Patient Details  Name: Kharma Sampsel MRN: [id number] Date of Birth: [birth year] Today's Date: 04/18/2016  Long term goals set: 2  Long term goals met:  2  Comments on progress toward goals: Pt with limited participation in TR services as pt developed B PE's being placed on bedrest for 2 days.  Pt is discharging home today with family at overall Mod I level for simple TR tasks given extra time and use of RW.  Pt requires supervision during community pursuits for safety negotiating various surface types and around obstacles.  Education provided on importance of staying active, activity analysis, community pursuits and energy conservation. Reasons for discharge: discharge from hospital  Patient/family agrees with progress made and goals achieved: Yes  SIMPSON,LISA 04/18/2016, 9:37 AM

## 2016-04-18 NOTE — Progress Notes (Signed)
Social Work  Discharge Note  The overall goal for the admission was met for:   Discharge location: [place] 1-2 WEEKS  Length of Stay: Yes-19 DAYS  Discharge activity level: Yes-MOD/I LEVEL  Home/community participation: Yes  Services provided included: MD, RD, PT, OT, RN, CM, TR, Pharmacy, Neuropsych and SW  Financial Services: Medicare and Private Insurance: [place]  Follow-up services arranged: Home Health: [facility], DME: [place], [place] and Patient/Family has no preference for HH/DME agencies  Comments (or additional information):BROTHER AND SISTER HERE DAY OF DC TO GO THROUGH FAMILY EDUCATION AND SISTER TO STAY WITH FOR 1-2 WEEKS FOR TRANSITION HOME. BROTHER HAS BUILT A RAMP FOR PT;S HOME SINCE HOSPITALIZED  Patient/Family verbalized understanding of follow-up arrangements: Yes  Individual responsible for coordination of the follow-up plan: SELF & HILDA-SISTER  Confirmed correct DME delivered: [name] 04/18/2016    [name]

## 2016-04-19 ENCOUNTER — Telehealth: Payer: Self-pay

## 2016-04-19 NOTE — Telephone Encounter (Signed)
   Spoke with sister.   1. Are you/is patient experiencing any problems since coming home? Are there any questions regarding any aspect of care? Having pain.  2. Are there any questions regarding medications administration/dosing? Are meds being taken as prescribed? Patient should review meds with caller to confirm. Meds have been confirmed.  3. Have there been any falls?  No  4. Has Home Health been to the house and/or have they contacted you? If not, have you tried to contact them? Can we help you contact them? Home health is scheduled to visit pt.  5. Are bowels and bladder emptying properly? Are there any unexpected incontinence issues? If applicable, is patient following bowel/bladder programs? No issues. 6. Any fevers, problems with breathing, unexpected pain? No issues. 7. Are there any skin problems or new areas of breakdown? No issues. 8. Has the patient/family member arranged specialty MD follow up (ie cardiology/neurology/renal/surgical/etc)?  Can we help arrange? Follow up appointments have been made. 9. Does the patient need any other services or support that we can help arrange? No 10. Are caregivers following through as expected in assisting the patient? Yes, sister Has the patient quit smoking, drinking alcohol, or using drugs as recommended? Pt is not smoking, drinking or using drugs.   Pt's sister is aware of the appointment on 04/26/16 @ 8:45 am.

## 2016-04-26 ENCOUNTER — Encounter: Payer: Self-pay | Admitting: Physical Medicine & Rehabilitation

## 2016-04-26 ENCOUNTER — Encounter
Payer: BLUE CROSS/BLUE SHIELD | Attending: Physical Medicine & Rehabilitation | Admitting: Physical Medicine & Rehabilitation

## 2016-04-26 VITALS — BP 120/81 | HR 81 | Resp 16

## 2016-04-26 DIAGNOSIS — Z9889 Other specified postprocedural states: Secondary | ICD-10-CM | POA: Insufficient documentation

## 2016-04-26 DIAGNOSIS — M549 Dorsalgia, unspecified: Secondary | ICD-10-CM | POA: Insufficient documentation

## 2016-04-26 DIAGNOSIS — E669 Obesity, unspecified: Secondary | ICD-10-CM

## 2016-04-26 DIAGNOSIS — I10 Essential (primary) hypertension: Secondary | ICD-10-CM | POA: Diagnosis not present

## 2016-04-26 DIAGNOSIS — I2699 Other pulmonary embolism without acute cor pulmonale: Secondary | ICD-10-CM | POA: Diagnosis not present

## 2016-04-26 DIAGNOSIS — G8918 Other acute postprocedural pain: Secondary | ICD-10-CM

## 2016-04-26 DIAGNOSIS — M5416 Radiculopathy, lumbar region: Secondary | ICD-10-CM

## 2016-04-26 DIAGNOSIS — M4316 Spondylolisthesis, lumbar region: Secondary | ICD-10-CM | POA: Diagnosis present

## 2016-04-26 DIAGNOSIS — E119 Type 2 diabetes mellitus without complications: Secondary | ICD-10-CM | POA: Insufficient documentation

## 2016-04-26 DIAGNOSIS — F411 Generalized anxiety disorder: Secondary | ICD-10-CM

## 2016-04-26 DIAGNOSIS — Z5189 Encounter for other specified aftercare: Secondary | ICD-10-CM | POA: Diagnosis not present

## 2016-04-26 DIAGNOSIS — D696 Thrombocytopenia, unspecified: Secondary | ICD-10-CM

## 2016-04-26 DIAGNOSIS — M4806 Spinal stenosis, lumbar region: Secondary | ICD-10-CM | POA: Insufficient documentation

## 2016-04-26 DIAGNOSIS — K219 Gastro-esophageal reflux disease without esophagitis: Secondary | ICD-10-CM | POA: Insufficient documentation

## 2016-04-26 DIAGNOSIS — F419 Anxiety disorder, unspecified: Secondary | ICD-10-CM | POA: Insufficient documentation

## 2016-04-26 DIAGNOSIS — R269 Unspecified abnormalities of gait and mobility: Secondary | ICD-10-CM | POA: Diagnosis not present

## 2016-04-26 DIAGNOSIS — E1169 Type 2 diabetes mellitus with other specified complication: Secondary | ICD-10-CM

## 2016-04-26 MED ORDER — OXYCODONE HCL 5 MG PO TABS: 5.0000 mg | ORAL_TABLET | Freq: Every day | ORAL | Status: DC

## 2016-04-26 MED ORDER — METHOCARBAMOL 500 MG PO TABS: 500.0000 mg | ORAL_TABLET | Freq: Four times a day (QID) | ORAL | Status: DC

## 2016-04-26 NOTE — Addendum Note (Signed)
Addended by: Delice Lesch A on: 04/26/2016 10:49 AM   Modules accepted: Orders

## 2016-04-26 NOTE — Progress Notes (Signed)
Subjective:    Patient ID: Sheri Baker, female    DOB: [birth year], 65 y.o.   MRN: [id number]  HPI  65 y.o. 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 Admit date: 03/30/2016 Discharge date: 04/18/2016 At discharge, she was encouraged to continue with back precautions, which she has been compliant with, avoid driving.  She was encouraged to check her CBGs, which have been around ~100. She has not obtained repeat CBC.  While in the hospital, she also developed a PE, she is taking her xarelto, but has not been using her IS. Her pain is the same. She never received her Oxycontin.  She is taking hydocodone, Gabapentin (800 5/day per pt - tried different variations), Lidoderm, Klonipin 0.5 qhs, Lexapro daily.  Her constipation has resolved.   DME: Bedside commode (not using), shower chair Therapies: 3/week (PT) Mobility: Rolling walker  Pain Inventory Average Pain 7 Pain Right Now 6 My pain is burning and aching  In the last 24 hours, has pain interfered with the following? General activity NA Relation with others NA Enjoyment of life NA What TIME of day is your pain at its worst? NA Sleep (in general) NA  Pain is worse with: sitting and some activites Pain improves with: medication Relief from Meds: 1  Mobility walk with assistance use a walker ability to climb steps?  yes do you drive?  yes needs help with transfers  Function employed # of hrs/week NA I need assistance with the following:  household duties and shopping  Neuro/Psych weakness numbness spasms anxiety  Prior Studies x-rays CT/MRI  Physicians involved in your care Primary care .   History reviewed. No  pertinent family history. Social History   Social History  . Marital Status: Single    Spouse Name: N/A  . Number of Children: N/A  . Years of Education: N/A   Social History Main Topics  . Smoking status: Former Smoker -- 15 years  . Smokeless tobacco: None     Comment: quit approx age 55 e 69  . Alcohol Use: 1.8 oz/week    3 Glasses of wine per week  . Drug Use: No  . Sexual Activity: Not Asked   Other Topics Concern  . None   Social History Narrative   Past Surgical History  Procedure Laterality Date  . Foot surgery Left     Callus- reaset bone  . Abdominal hysterectomy      partial -right ovary remains  . Tonsillectomy    . Cervix removal    . Colonoscopy     Past Medical History  Diagnosis Date  . Hypertension   . Dysrhythmia     "palpations with full dose of hydrocodone"- takes 1/2 of Hydrocodone- Acetaminophen and does not have palpations  . 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 Hypothyroidism   . GERD (gastroesophageal reflux disease)   . Constipation   . History of hiatal hernia   . Hemorrhoids   . Anemia   . Arthritis   . Headache     occasional   BP 120/81 mmHg  Pulse 81  Resp 16  SpO2 96%  Opioid Risk Score:   Fall Risk  Score:  `1  Depression screen PHQ 2/9  No flowsheet data found.   Review of Systems  Musculoskeletal: Positive for back pain and gait problem.  Neurological: Positive for weakness and numbness.       Spasms   Psychiatric/Behavioral: The patient is nervous/anxious.   All other systems reviewed and are negative.     Objective:   Physical Exam Constitutional: She appears well-developed and well-nourished. No distress. Morbidly obese   HENT:  Normocephalic and atraumatic.   Eyes: Conjunctivae and Conj are normal.    Cardiovascular: Regular rate and rhythm.  No murmur heard. Respiratory: Effort normal. No stridor. She has no wheezes.   GI: Soft. Bowel sounds are normal. There  is no tenderness.  Musculoskeletal: She exhibits edema. Trace to 1+ pretibial edema  Neurological: She is alert and oriented.  B/l UE strength 4+/5.   RLE: 4-4+/5 proximally, 4+/5 distally LLE: 4+/5 proximally, 5/5 distally Skin: Skin is warm and dry. She is not diaphoretic.  Lower back incision closed. Psychiatric: Anxious. Her speech is normal. Judgment and thought content normal. Cognition and memory are normal.    Assessment & Plan:  65 y.o. 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  1. Lumbar spondlylolisthesis and radiculopathy, s/p lami and decompression  Follow up with Surgery 05/02/16  Cont therapies   2.  B/l PE  Cont Xarelto 15mg  BID until 7/10, then 20mg  daily  Follow up with PCP  3. Pain Management:   Cont hydrocodone, will prescribed extra PM dose of Oxycodone until next surgery appointment.   Scheduled tramadol for better pain control.               Cont robaxin 750mg  qid scheduled, added PM dose             Utilize ice/heat also             Gabapentin 800 5/day (per pt that seems to help)  Cont Lidoderm patch  Cont Tizanidine 2 mg TID  4. Anxiety  Cont Klonopin 0.5qHS  Cont Lexapro  5. T2DM  Good control at present  Follow up with PCP  6. Thrombocytopenia  CBC ordered   7. Abnormality of gait  Cont walker for safety  8. Morbid obesity  Cont exercises  Cont weight loss   Referrals reviewed Meds reviewed All questions answered

## 2016-06-06 ENCOUNTER — Encounter: Payer: BLUE CROSS/BLUE SHIELD | Admitting: Physical Medicine & Rehabilitation

## 2016-06-07 ENCOUNTER — Encounter: Payer: Self-pay | Admitting: Physical Medicine & Rehabilitation

## 2016-06-07 ENCOUNTER — Encounter
Payer: BLUE CROSS/BLUE SHIELD | Attending: Physical Medicine & Rehabilitation | Admitting: Physical Medicine & Rehabilitation

## 2016-06-07 ENCOUNTER — Ambulatory Visit: Payer: BLUE CROSS/BLUE SHIELD | Admitting: Physical Medicine & Rehabilitation

## 2016-06-07 VITALS — BP 115/68 | HR 99 | Resp 17

## 2016-06-07 DIAGNOSIS — M4316 Spondylolisthesis, lumbar region: Secondary | ICD-10-CM | POA: Diagnosis present

## 2016-06-07 DIAGNOSIS — M4806 Spinal stenosis, lumbar region: Secondary | ICD-10-CM | POA: Diagnosis not present

## 2016-06-07 DIAGNOSIS — Z5189 Encounter for other specified aftercare: Secondary | ICD-10-CM | POA: Diagnosis not present

## 2016-06-07 DIAGNOSIS — R269 Unspecified abnormalities of gait and mobility: Secondary | ICD-10-CM | POA: Diagnosis not present

## 2016-06-07 DIAGNOSIS — M5416 Radiculopathy, lumbar region: Secondary | ICD-10-CM

## 2016-06-07 DIAGNOSIS — I10 Essential (primary) hypertension: Secondary | ICD-10-CM | POA: Insufficient documentation

## 2016-06-07 DIAGNOSIS — E669 Obesity, unspecified: Secondary | ICD-10-CM

## 2016-06-07 DIAGNOSIS — M549 Dorsalgia, unspecified: Secondary | ICD-10-CM | POA: Insufficient documentation

## 2016-06-07 DIAGNOSIS — Z9889 Other specified postprocedural states: Secondary | ICD-10-CM | POA: Insufficient documentation

## 2016-06-07 DIAGNOSIS — G5791 Unspecified mononeuropathy of right lower limb: Secondary | ICD-10-CM

## 2016-06-07 DIAGNOSIS — G8918 Other acute postprocedural pain: Secondary | ICD-10-CM

## 2016-06-07 DIAGNOSIS — F419 Anxiety disorder, unspecified: Secondary | ICD-10-CM | POA: Diagnosis not present

## 2016-06-07 DIAGNOSIS — G5792 Unspecified mononeuropathy of left lower limb: Secondary | ICD-10-CM

## 2016-06-07 DIAGNOSIS — K219 Gastro-esophageal reflux disease without esophagitis: Secondary | ICD-10-CM | POA: Diagnosis not present

## 2016-06-07 DIAGNOSIS — E119 Type 2 diabetes mellitus without complications: Secondary | ICD-10-CM | POA: Diagnosis not present

## 2016-06-07 DIAGNOSIS — R252 Cramp and spasm: Secondary | ICD-10-CM

## 2016-06-07 DIAGNOSIS — F411 Generalized anxiety disorder: Secondary | ICD-10-CM | POA: Diagnosis not present

## 2016-06-07 DIAGNOSIS — I2699 Other pulmonary embolism without acute cor pulmonale: Secondary | ICD-10-CM

## 2016-06-07 DIAGNOSIS — M48061 Spinal stenosis, lumbar region without neurogenic claudication: Secondary | ICD-10-CM

## 2016-06-07 DIAGNOSIS — G5793 Unspecified mononeuropathy of bilateral lower limbs: Secondary | ICD-10-CM

## 2016-06-07 DIAGNOSIS — E1169 Type 2 diabetes mellitus with other specified complication: Secondary | ICD-10-CM

## 2016-06-07 MED ORDER — LIDOCAINE 5 % EX PTCH: MEDICATED_PATCH | CUTANEOUS | 0 refills | Status: DC

## 2016-06-07 MED ORDER — DULOXETINE HCL 30 MG PO CPEP: 30.0000 mg | ORAL_CAPSULE | Freq: Every day | ORAL | 1 refills | Status: DC

## 2016-06-07 MED ORDER — GABAPENTIN 800 MG PO TABS: 800.0000 mg | ORAL_TABLET | Freq: Three times a day (TID) | ORAL | 1 refills | Status: DC

## 2016-06-07 NOTE — Progress Notes (Signed)
Subjective:    Patient ID: Sheri Baker, female    DOB: 1951-10-01, 65 y.o.   MRN: [id number]  HPI  65 y.o. 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  Last clinic visit 04/26/16.  At that time, she was encouraged to cont xarelto.  Her pain meds were refilled. Her meds were refilled (Gabapentin, Lidoderm, Tizanidine 2mg  TID). She continues to have anxiety.  A CBC was ordered for thrombocytopenia.  Pt states her PCP evaluated and everyone was WNL.  She continues walker for ambulation. She had an hypoglycemic episode, followed by her PCP.   Pain Inventory Average Pain 5 Pain Right Now NA My pain is constant, burning, stabbing and aching  In the last 24 hours, has pain interfered with the following? General activity NA Relation with others NA Enjoyment of life NA What TIME of day is your pain at its worst? morning, night Sleep (in general) Fair  Pain is worse with: walking and sitting Pain improves with: NA Relief from Meds: 2  Mobility walk without assistance walk with assistance use a walker how many minutes can you walk? 5 ability to climb steps?  no do you drive?  no Do you have any goals in this area?  yes  Function I need assistance with the following:  dressing, household duties and shopping  Neuro/Psych weakness numbness trouble walking  Prior Studies Any changes since last visit?  no  Physicians involved in your care Any changes since last visit?  no Primary care .   History reviewed. No pertinent family history. Social History   Social History  . Marital status: Single    Spouse name: N/A  . Number of children: N/A  . Years of education: N/A   Social History Main Topics  . Smoking status: Former Smoker    Years: 15.00  . Smokeless tobacco:  Former Systems developer     Comment: quit approx age 49 e 50  . Alcohol use 1.8 oz/week    3 Glasses of wine per week  . Drug use: No  . Sexual activity: Not Asked   Other Topics Concern  . None   Social History Narrative  . None   Past Surgical History:  Procedure Laterality Date  . ABDOMINAL HYSTERECTOMY     partial -right ovary remains  . CERVIX REMOVAL    . COLONOSCOPY    . FOOT SURGERY Left    Callus- reaset bone  . TONSILLECTOMY     Past Medical History:  Diagnosis Date  . Anemia   . Arthritis   . Constipation   . Diabetes mellitus without complication (HCC)    Type II- not on medications  . Dysrhythmia    "palpations with full dose of hydrocodone"- takes 1/2 of Hydrocodone- Acetaminophen and does not have palpations  . GERD (gastroesophageal reflux disease)   . Headache    occasional  . Hemorrhoids   . History of hiatal hernia   . Hypertension   . Hypothyroidism    BP 115/68   Pulse 99   Resp 17   SpO2 97%   Opioid Risk Score:   Fall Risk Score:  `1  Depression screen PHQ 2/9  No flowsheet data found.   Review of Systems  Musculoskeletal: Positive for gait problem.  Neurological: Positive for weakness  and numbness.       Spasms   Psychiatric/Behavioral: The patient is nervous/anxious.   All other systems reviewed and are negative.     Objective:   Physical Exam Constitutional: She appears well-developed and well-nourished. No distress. Morbidly obese   HENT:  Normocephalic and atraumatic.   Eyes: Conjunctivae and Conj are normal.    Cardiovascular: Regular rate and rhythm.  No murmur heard. Respiratory: Effort normal. No stridor. She has no wheezes.   GI: Soft. Bowel sounds are normal. There is no tenderness.  Musculoskeletal: She exhibits edema. Trace to 1+ pretibial edema  Neurological: She is alert and oriented.  B/l UE strength 4+/5.   RLE: 4+/5 proximally, 5/5 distally LLE: 4+/5 proximally, 5/5 distally (slightly weaker than right) Skin: Skin  is warm and dry. She is not diaphoretic.  Lower back incision closed. Psychiatric: Anxious. Her speech is normal. Judgment and thought content normal. Cognition and memory are normal.    Assessment & Plan:  65 y.o. 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  1. Lumbar spondlylolisthesis and radiculopathy, s/p lami and decompression  Cont follow up with surgery  Completed therapies, cont HEP   2.  B/l PE  Cont Xarelto per PCP  3. Pain Management:   Cont tramadol 50mg  QID, will attempt to wean             Cont robaxin 750mg  qid scheduled             Utilize ice/heat also  Will decrease Gabapentin to 800 3/day   Will reorder Lidoderm patch  Cymbalta 30 daily (educated on signs symptoms of serotonin syndrome)  4. Anxiety  Completed Klonopin and Lexapro  5. T2DM  With episodes of hypoglycemia  Follow up with PCP  6. Abnormality of gait  Cont walker for safety  7. Morbid obesity  Cont exercises  Cont weight loss

## 2016-07-14 IMAGING — CT CT L SPINE W/ CM
3 of 8 series · 10 of 33 positions shown, 12 images · non-contrast
Comparison: MRI 11/30/2015.  Previous injection radiographs.

CLINICAL DATA: Low back pain. Bilateral leg pain and numbness.
Spondylosis without myelopathy.
TECHNIQUE: Contiguous axial images were obtained through the Lumbar spine after
the intrathecal infusion of infusion. Coronal and sagittal
reconstructions were obtained of the axial image sets.

[Series 2: l spine soft · axial · 0.27mm/px · z∈[-53,+40]mm · 2 of 75 slices shown, 3 images]
[im 22/75  soft-tissue]
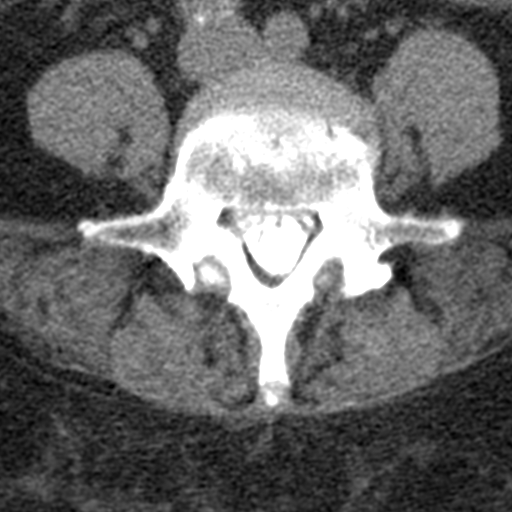
[im 22/75  bone]
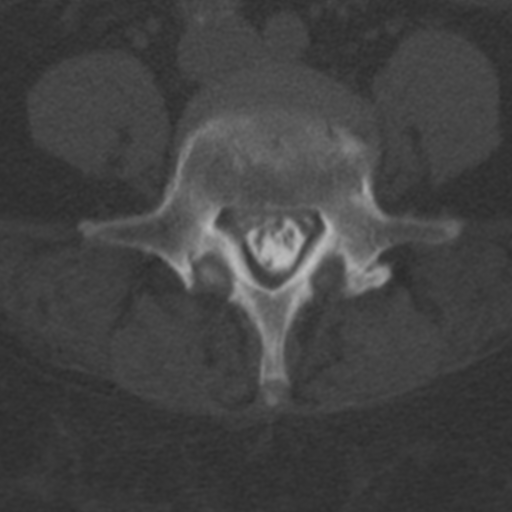
[im 53/75  bone]
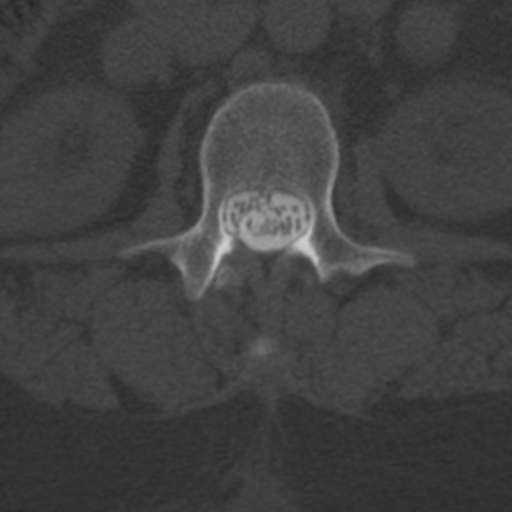

[Series 6: bone cor · coronal · 0.27mm/px · 3 of 43 slices shown]
[im 9/43  bone]
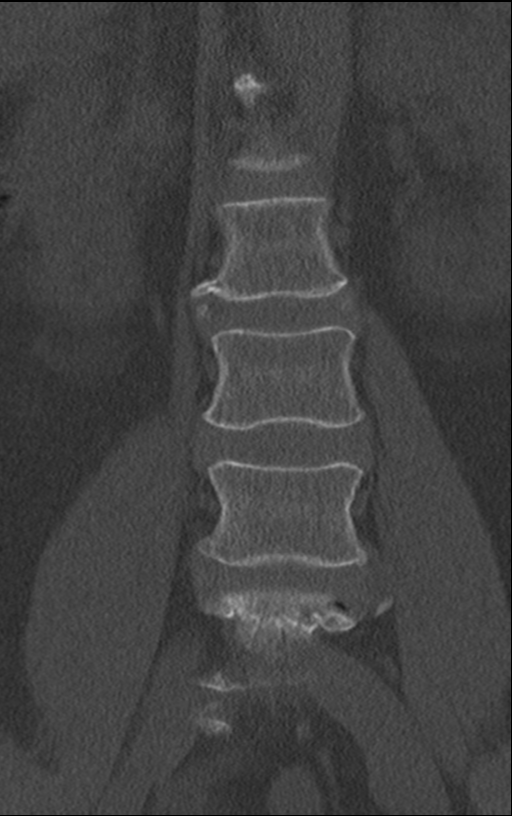
[im 17/43  bone]
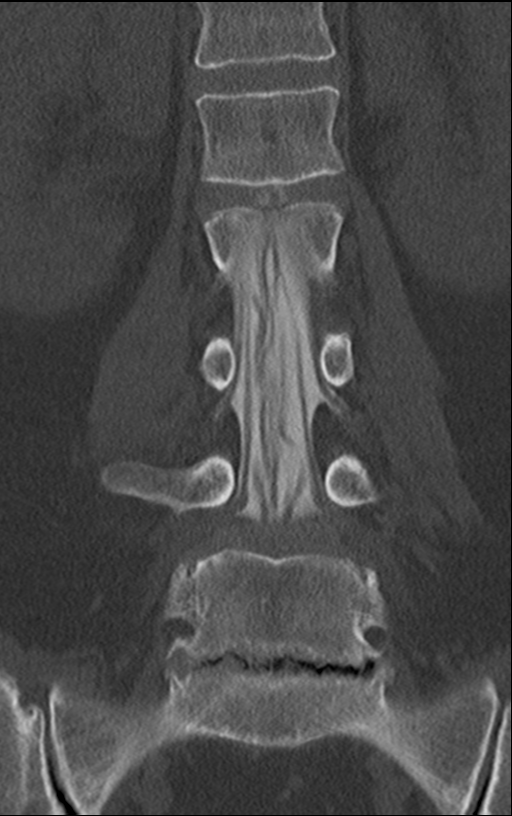
[im 26/43  bone]
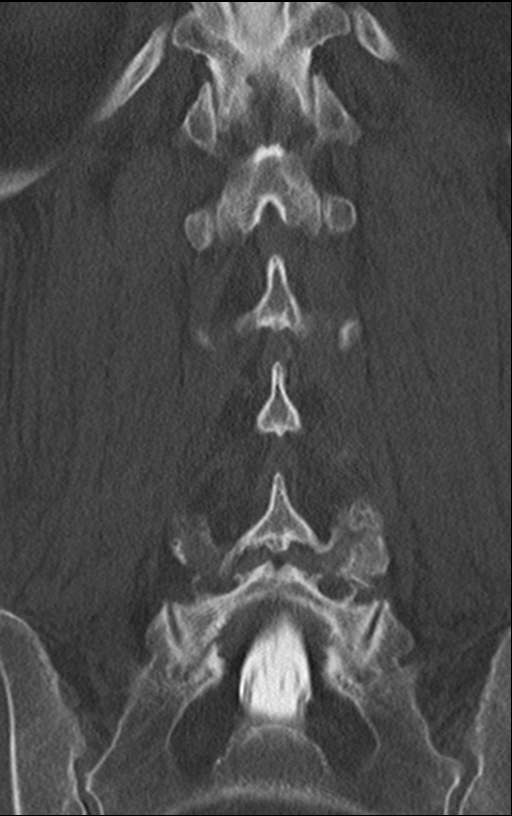

[Series 7: sag bone · sagittal · 0.27mm/px · 5 of 42 slices shown, 6 images]
[im 14/42  bone]
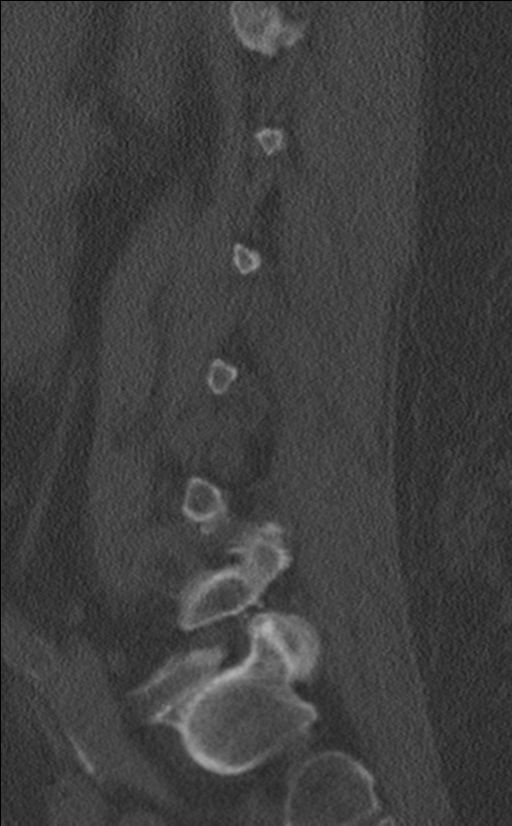
[im 18/42  bone]
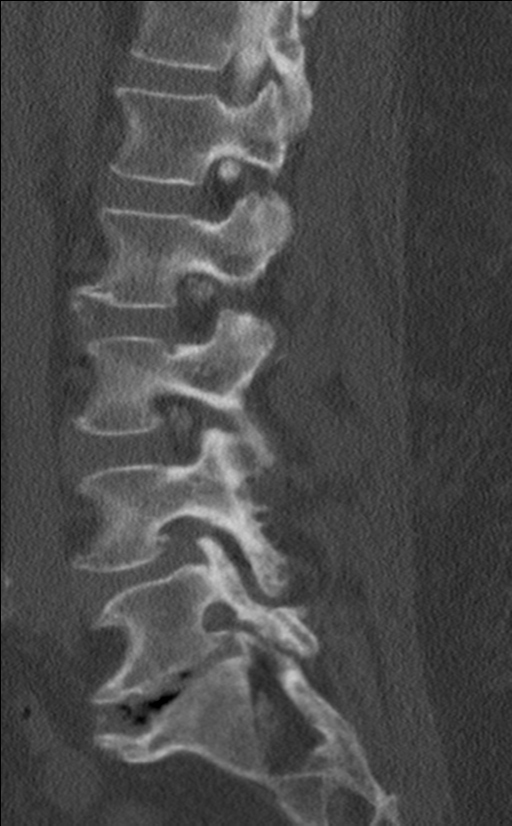
[im 21/42  soft-tissue]
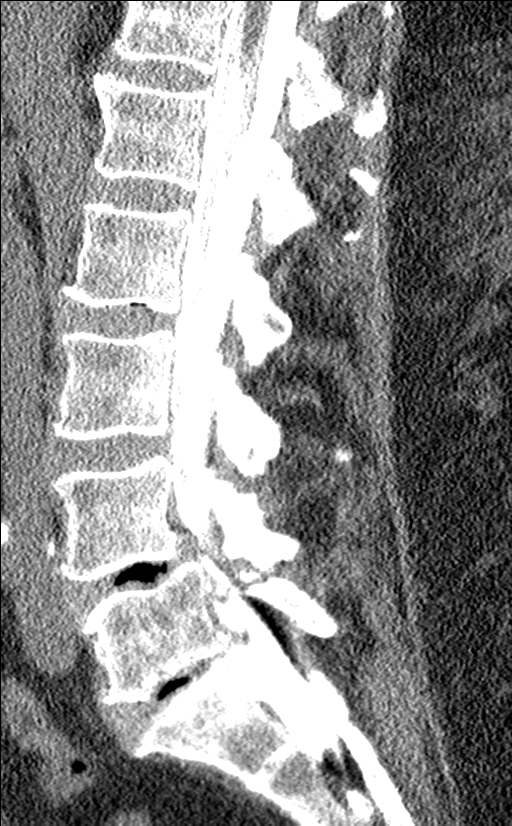
[im 21/42  bone]
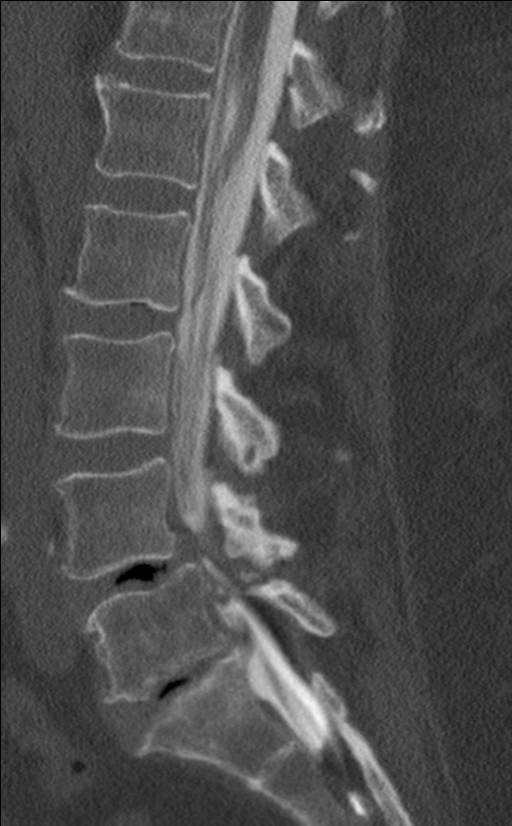
[im 24/42  bone]
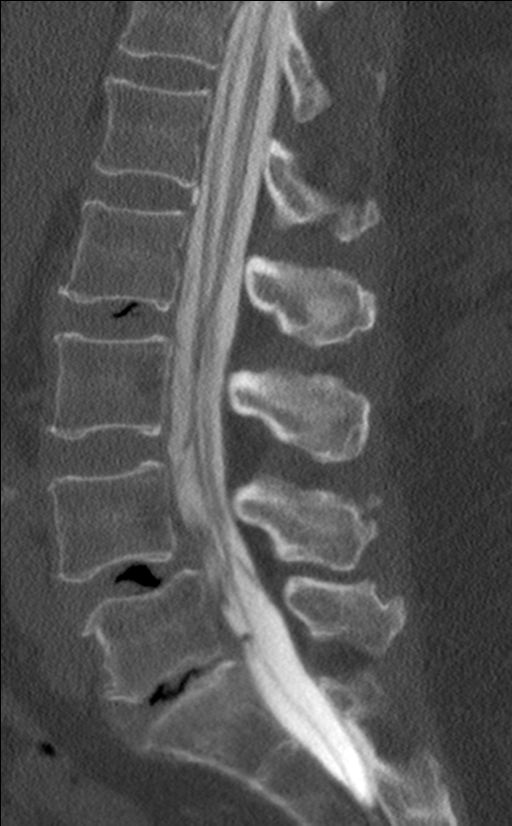
[im 28/42  bone]
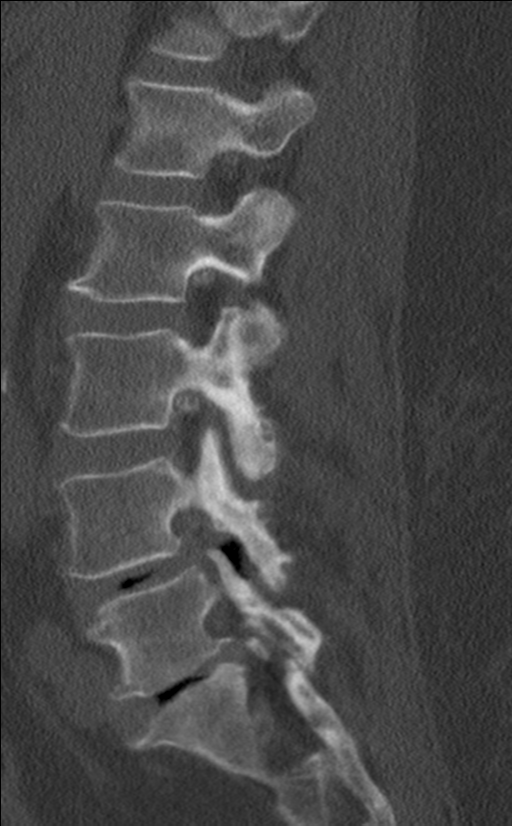

[10 of 33 positions shown; findings below may reference images not displayed]

EXAM:
LUMBAR MYELOGRAM

FLUOROSCOPY TIME:  1 minutes 40 seconds. 576.69 micro gray meter
squared

PROCEDURE:
After thorough discussion of risks and benefits of the procedure
including bleeding, infection, injury to nerves, blood vessels,
adjacent structures as well as headache and CSF leak, written and
oral informed consent was obtained. Consent was obtained by Dr. Falla
Elzelien. Time out form was completed.

Patient was positioned prone on the fluoroscopy table. Local
anesthesia was provided with 1% lidocaine without epinephrine after
prepped and draped in the usual sterile fashion. Puncture was
performed at L2-3 using a 6 inch 22-gauge spinal needle via left
para median approach. Using a single pass through the dura, the
needle was placed within the thecal sac, with return of clear CSF.
15 mL of Isovue 200 was injected into the thecal sac, with normal
opacification of the nerve roots and cauda equina consistent with
free flow within the subarachnoid space.

I personally performed the lumbar puncture and administered the
intrathecal contrast. I also personally performed acquisition of the
myelogram images.
FINDINGS: LUMBAR MYELOGRAM FINDINGS:

No stenosis at L3-4 or above. At L4-5, there is anterolisthesis of 7
mm in the supine position. There is a prominent anterior extradural
defect consistent with disc herniation. At L5-S1, the disc is
narrowed. Small anterior extradural defect. Spinal stenosis is most
severe at the L4-5 level. There is lateral recess stenosis of the
L5-S1 level. Standing lateral flexion extension views show worsening
of the anterolisthesis to 14 mm. This is not worsened further with
flexion. A reduces slightly with extension. 2 mm of anterolisthesis
is noted at L3-4 with flexion.

CT LUMBAR MYELOGRAM FINDINGS:

T12-L1 and L1-2:  Normal.  Conus tip at lower L1.

L2-3: Mild vacuum phenomenon.  No bulge or stenosis.

L3-4: No bulge or stenosis. Bilateral facet degeneration. Normal
alignment in this position.

L4-5: Disc degeneration of vacuum phenomenon. Circumferential
bulging of the disc. Bilateral facet arthropathy with gaping facet
joints. Anterolisthesis of only 6 mm in this position. Narrowing of
both lateral recesses and foramina, which would worsen significantly
when anterolisthesis occurs with standing and flexion.

L5-S1: Disc degeneration with vacuum phenomenon. Endplate
osteophytes and bulging of the disc. Facet degeneration and
hypertrophy. No central canal stenosis. Foraminal narrowing
bilaterally that could possibly affect the exiting L5 nerve roots.
IMPRESSION: L3-4: Facet arthropathy which allows 2 mm of anterolisthesis with
standing/flexion. No apparent compressive stenosis at this time
however.

L4-5: Advanced bilateral facet arthropathy with gaping joints.
Anterolisthesis that ranges from 6 mm to 14 mm with standing and
flexion. Disc degeneration with shallow broad-based disc herniation.
Likely neural compression in the lateral recesses and foramina,
particularly with standing and flexion.

L5-S1: Disc degeneration with endplate osteophytes covering bulging
disc material. Bilateral facet arthropathy. Narrowing of the
subarticular lateral recesses and neural foramina that could cause
neural compression at this level as well.

## 2016-07-18 ENCOUNTER — Telehealth: Payer: Self-pay | Admitting: Physical Medicine & Rehabilitation

## 2016-07-18 NOTE — Telephone Encounter (Signed)
Patient needs to know if she should still be taking Niferex.  Please call patient.

## 2016-07-18 NOTE — Telephone Encounter (Signed)
I did not prescribe that.  She is should follow up with her PCP.  Thanks

## 2016-07-18 NOTE — Telephone Encounter (Signed)
Spoke to patient and told her to call PCP for Niferex.

## 2016-07-19 ENCOUNTER — Ambulatory Visit: Payer: BLUE CROSS/BLUE SHIELD | Admitting: Physical Medicine & Rehabilitation

## 2016-08-05 ENCOUNTER — Other Ambulatory Visit: Payer: Self-pay | Admitting: Physical Medicine & Rehabilitation

## 2016-08-29 ENCOUNTER — Telehealth: Payer: Self-pay

## 2016-08-29 NOTE — Telephone Encounter (Signed)
Sheri Baker would like for Dr.Patel to sign the home health certification, plan of care and also medication list. She also stated the order was dated June '17. She can be reached at [phone].

## 2016-10-05 ENCOUNTER — Telehealth: Payer: Self-pay | Admitting: *Deleted

## 2016-10-05 NOTE — Telephone Encounter (Signed)
Order faxed to [place], signed and dated y Dr. Posey Pronto, faxed back to [facility]. Documents sent to scan

## 2016-10-05 NOTE — Telephone Encounter (Signed)
This individual called again stating that they needed this post dated order signed so they can close out her chart.  They are faxing over the order so we can sign and fax back

## 2016-12-12 NOTE — Telephone Encounter (Signed)
Error

## 2018-07-22 LAB — LIPID PANEL
Cholesterol: 148 (ref 0–200)
HDL: 47 (ref 35–70)
LDL Cholesterol: 83
Triglycerides: 92 (ref 40–160)

## 2018-07-22 LAB — BASIC METABOLIC PANEL
BUN: 10 (ref 4–21)
Creatinine: 0.8 (ref 0.5–1.1)

## 2018-07-22 LAB — HEMOGLOBIN A1C: Hgb A1c MFr Bld: 6 (ref 4.0–6.0)

## 2018-07-22 LAB — TSH: TSH: 0.3 — AB (ref 0.41–5.90)

## 2018-10-01 ENCOUNTER — Encounter: Payer: Self-pay | Admitting: "Endocrinology

## 2018-10-01 ENCOUNTER — Ambulatory Visit (INDEPENDENT_AMBULATORY_CARE_PROVIDER_SITE_OTHER): Payer: Medicare Other | Admitting: "Endocrinology

## 2018-10-01 VITALS — BP 122/83 | HR 84 | Ht 65.5 in | Wt 280.0 lb

## 2018-10-01 DIAGNOSIS — R7989 Other specified abnormal findings of blood chemistry: Secondary | ICD-10-CM | POA: Insufficient documentation

## 2018-10-01 DIAGNOSIS — E039 Hypothyroidism, unspecified: Secondary | ICD-10-CM | POA: Insufficient documentation

## 2018-10-01 DIAGNOSIS — E119 Type 2 diabetes mellitus without complications: Secondary | ICD-10-CM

## 2018-10-01 MED ORDER — SYNTHROID 125 MCG PO TABS: 125.0000 ug | ORAL_TABLET | Freq: Every day | ORAL | 2 refills | Status: DC

## 2018-10-01 NOTE — Progress Notes (Signed)
Endocrinology Consult Note                                         10/01/2018, 11:30 AM   Sheri Baker is a 67 y.o.-year-old female patient being seen in consultation for hypothyroidism referred by Sudie Grumbling, DO (Inactive).   Past Medical History:  Diagnosis Date  . Anemia   . Arthritis   . Constipation   . Diabetes mellitus without complication (HCC)    Type II- not on medications  . Dysrhythmia    "palpations with full dose of hydrocodone"- takes 1/2 of Hydrocodone- Acetaminophen and does not have palpations  . GERD (gastroesophageal reflux disease)   . Headache    occasional  . Hemorrhoids   . History of hiatal hernia   . Hypertension   . Hypothyroidism    Past Surgical History:  Procedure Laterality Date  . ABDOMINAL HYSTERECTOMY     partial -right ovary remains  . BACK SURGERY    . cataract    . CERVIX REMOVAL    . COLONOSCOPY    . FOOT SURGERY Left    Callus- reaset bone  . NASAL SINUS SURGERY    . TONSILLECTOMY     Social History   Socioeconomic History  . Marital status: Single    Spouse name: Not on file  . Number of children: Not on file  . Years of education: Not on file  . Highest education level: Not on file  Occupational History  . Not on file  Social Needs  . Financial resource strain: Not on file  . Food insecurity:    Worry: Not on file    Inability: Not on file  . Transportation needs:    Medical: Not on file    Non-medical: Not on file  Tobacco Use  . Smoking status: Former Smoker    Packs/day: 0.25    Years: 15.00    Pack years: 3.75    Types: Cigarettes  . Smokeless tobacco: Former Systems developer  . Tobacco comment: quit approx age 41 e 26  Substance and Sexual Activity  . Alcohol use: Yes    Alcohol/week: 3.0 standard drinks    Types: 3 Glasses of wine per week  . Drug use: No  . Sexual activity: Not on file  Lifestyle  . Physical activity:    Days  per week: Not on file    Minutes per session: Not on file  . Stress: Not on file  Relationships  . Social connections:    Talks on phone: Not on file    Gets together: Not on file    Attends religious service: Not on file    Active member of club or organization: Not on file    Attends meetings of clubs or organizations: Not on file    Relationship status: Not on file  Other Topics Concern  . Not on file  Social History Narrative  . Not on file  Outpatient Encounter Medications as of 10/01/2018  Medication Sig  . fexofenadine (ALLEGRA) 180 MG tablet Take 180 mg by mouth daily.  . hydrochlorothiazide (HYDRODIURIL) 25 MG tablet Take 25 mg by mouth daily.  Marland Kitchen losartan (COZAAR) 100 MG tablet   . methocarbamol (ROBAXIN) 750 MG tablet Take 1 tablet (750 mg total) by mouth 4 (four) times daily.  Marland Kitchen omeprazole (PRILOSEC) 20 MG capsule Take 20 mg by mouth daily.  . Polysaccharide Iron Complex (FERREX 150 PO) Take by mouth.  . Potassium Gluconate 550 MG TABS Take by mouth 2 (two) times daily.  . Probiotic Product (PROBIOTIC DAILY PO) Take by mouth daily.  . rivaroxaban (XARELTO) 20 MG TABS tablet Take 20 mg by mouth daily.  . simvastatin (ZOCOR) 40 MG tablet Take 40 mg by mouth daily.  Marland Kitchen spironolactone (ALDACTONE) 25 MG tablet Take 25 mg by mouth daily.  Marland Kitchen VITAMIN D PO Take by mouth daily.  . [DISCONTINUED] iron polysaccharides (NIFEREX) 150 MG capsule Take 1 capsule (150 mg total) by mouth 2 (two) times daily.  . [DISCONTINUED] thyroid (ARMOUR) 120 MG tablet Take 120 mg by mouth daily before breakfast.  . cetirizine HCl (ZYRTEC) 5 MG/5ML SOLN Take 5 mg by mouth daily.  Marland Kitchen gabapentin (NEURONTIN) 800 MG tablet TAKE 1 TABLET BY MOUTH THREE TIMES DAILY (Patient not taking: Reported on 10/01/2018)  . pregabalin (LYRICA) 100 MG capsule Take 100 mg by mouth 2 (two) times daily.  Marland Kitchen SYNTHROID 125 MCG tablet Take 1 tablet (125 mcg total) by mouth daily before breakfast.  . [DISCONTINUED] blood glucose  meter kit and supplies KIT Dispense based on patient and insurance preference. Use up to four times daily as directed. (FOR ICD-9 250.00, 250.01).  . [DISCONTINUED] CRESTOR 20 MG tablet Take 20 mg by mouth daily.   . [DISCONTINUED] DULoxetine (CYMBALTA) 30 MG capsule Take 1 capsule (30 mg total) by mouth daily.  . [DISCONTINUED] lidocaine (LIDODERM) 5 % Apply to left hip at 7 am and remove at 7 pm daily  . [DISCONTINUED] NATURE-THROID 16.25 MG TABS Take 16.25 mg by mouth daily.   . [DISCONTINUED] omeprazole (PRILOSEC) 20 MG capsule Take 20 mg by mouth daily.  . [DISCONTINUED] OVER THE COUNTER MEDICATION Take 15 mLs by mouth daily. Beet powder supplement  . [DISCONTINUED] rivaroxaban (XARELTO) 20 MG TABS tablet Take 1 tablet (20 mg total) by mouth daily with supper. Starting on 04/30/16  . [DISCONTINUED] senna-docusate (SENOKOT-S) 8.6-50 MG tablet Take 2 tablets by mouth 2 (two) times daily.  . [DISCONTINUED] spironolactone-hydrochlorothiazide (ALDACTAZIDE) 25-25 MG tablet Take 1 tablet by mouth every morning.   . [DISCONTINUED] traMADol (ULTRAM) 50 MG tablet Take 1 tablet (50 mg total) by mouth 3 (three) times daily.   No facility-administered encounter medications on file as of 10/01/2018.    ALLERGIES: Allergies  Allergen Reactions  . Hydrocodone Palpitations    Pt states this was only with high dose, tolerated and had good relief with low dose  . Penicillins Rash    Has patient had a PCN reaction causing immediate rash, facial/tongue/throat swelling, SOB or lightheadedness with hypotension: Yes Has patient had a PCN reaction causing severe rash involving mucus membranes or skin necrosis: No Has patient had a PCN reaction that required hospitalization No Has patient had a PCN reaction occurring within the last 10 years: No If all of the above answers are "NO", then may proceed with Cephalosporin use.    VACCINATION STATUS: Immunization History  Administered Date(s) Administered  .  Pneumococcal  Polysaccharide-23 03/28/2016     HPI    Sheri Baker  is a patient with the above medical history. she was diagnosed  with hypothyroidism at approximate age of 37 years with lab work after suggestive clinical symptoms.  She was initiated on thyroid hormone supplements, and took various doses over the years.  Reportedly, she did not tolerate levothyroxine.  She is currently on Armour Thyroid 90 mg p.o. daily.  Her most recent thyroid function tests reveals free T4 of 0.72 associated with TSH of 0.299.   She reports compliance with medication.  Her major concerns today include inability to loss weight, cold intolerance, constipation, dry skin, and fatigue.  Pt denies feeling nodules in neck, hoarseness, dysphagia/odynophagia, SOB with lying down.  she denies family history of  thyroid disorders.  No family history of thyroid cancer.  No history of  radiation therapy to head or neck. No recent use of iodine supplements.  I reviewed her chart and she also has a history of type 2 diabetes with A1c of 6%, currently on no medications.   ROS:  Constitutional: +heavy weight, + fatigue, + subjective hypothermia Eyes: no blurry vision, no xerophthalmia ENT: no sore throat, no nodules palpated in throat, no dysphagia/odynophagia, no hoarseness Cardiovascular: no Chest Pain, no Shortness of Breath, no palpitations, no leg swelling Respiratory: no cough, no SOB Gastrointestinal: no Nausea/Vomiting/Diarhhea Musculoskeletal: no muscle/joint aches Skin: no rashes Neurological: no tremors, no numbness, no tingling, no dizziness Psychiatric: no depression, no anxiety   Physical Exam: BP 122/83   Pulse 84   Ht 5' 5.5" (1.664 m)   Wt 280 lb (127 kg)   BMI 45.89 kg/m  Wt Readings from Last 3 Encounters:  10/01/18 280 lb (127 kg)  04/17/16 281 lb 12 oz (127.8 kg)  03/27/16 (!) 305 lb 12.8 oz (138.7 kg)    Constitutional:  + Obese for height, not in acute distress, normal state of  mind Eyes: PERRLA, EOMI, no exophthalmos ENT: moist mucous membranes, + palpable thyroid, no cervical lymphadenopathy Cardiovascular: normal precordial activity, Regular Rate and Rhythm, no Murmur/Rubs/Gallops Respiratory:  adequate breathing efforts, no gross chest deformity, Clear to auscultation bilaterally Gastrointestinal: abdomen soft, Non -tender, No distension, Bowel Sounds present Musculoskeletal: no gross deformities, strength intact in all four extremities Skin: moist, warm, no rashes Neurological: no tremor with outstretched hands, Deep tendon reflexes normal in all four extremities.   CMP ( most recent) CMP     Component Value Date/Time   NA 136 04/13/2016 0631   K 3.5 04/13/2016 0631   CL 98 (L) 04/13/2016 0631   CO2 29 04/13/2016 0631   GLUCOSE 109 (H) 04/13/2016 0631   BUN 10 07/22/2018   CREATININE 0.8 07/22/2018   CREATININE 0.75 04/13/2016 0631   CALCIUM 8.9 04/13/2016 0631   PROT 5.9 (L) 03/31/2016 0421   ALBUMIN 2.4 (L) 03/31/2016 0421   AST 18 03/31/2016 0421   ALT 18 03/31/2016 0421   ALKPHOS 53 03/31/2016 0421   BILITOT 0.6 03/31/2016 0421   GFRNONAA >60 04/13/2016 0631   GFRAA >60 04/13/2016 0631     Diabetic Labs (most recent): Lab Results  Component Value Date   HGBA1C 6.0 07/22/2018   HGBA1C 6.0 (H) 03/31/2016   HGBA1C 6.2 (H) 03/20/2016     Lipid Panel ( most recent) Lipid Panel     Component Value Date/Time   CHOL 148 07/22/2018   TRIG 92 07/22/2018   HDL 47 07/22/2018   LDLCALC 83 07/22/2018  Lab Results  Component Value Date   TSH 0.30 (A) 07/22/2018       ASSESSMENT: 1. Hypothyroidism  PLAN:    Patient with long-standing hypothyroidism, on levothyroxine therapy.  Patient is on Armour Thyroid 90 mg daily.  Based on her clinical and biochemical assessment she is likely under replaced.   -Standard thyroid function tests are not directly helpful to regulate her Armour Thyroid dose.  -She is approached to switch to  Synthroid instead of Armour Thyroid and she agrees.  -I discussed and initiated Synthroid 125 mcg p.o. every morning with plan to repeat thyroid function test in 3 months with office visit..    - We discussed about correct intake of levothyroxine, at fasting, with water, separated by at least 30 minutes from breakfast, and separated by more than 4 hours from calcium, iron, multivitamins, acid reflux medications (PPIs). -Patient is made aware of the fact that thyroid hormone replacement is needed for life, dose to be adjusted by periodic monitoring of thyroid function tests.    -Her next lab work will include antithyroid antibodies to characterize the etiology of her hypothyroidism.  -Given palpable thyroid on physical exam, she will be considered for surveillance thyroid/neck ultrasound on subsequent visits. regarding type 2 diabetes with A1c of 6%: she Is not taking any medications.  -  Suggestion is made for her to avoid simple carbohydrates  from her diet including Cakes, Sweet Desserts / Pastries, Ice Cream, Soda (diet and regular), Sweet Tea, Candies, Chips, Cookies, Store Bought Juices, Alcohol in Excess of  1-2 drinks a day, Artificial Sweeteners, and "Sugar-free" Products. This will help patient to have stable blood glucose profile and potentially avoid unintended weight gain.   - Time spent with the patient: 35 minutes, of which >50% was spent in obtaining information about her symptoms, reviewing her previous labs, evaluations, and treatments, counseling her about her hypothyroidism, type 2 diabetes, and developing a plan to confirm the diagnosis and long term treatment as necessary.  Sheri Baker participated in the discussions, expressed understanding, and voiced agreement with the above plans.  All questions were answered to her satisfaction. she is encouraged to contact clinic should she have any questions or concerns prior to her return visit.  Return in about 8 weeks (around  11/26/2018) for Follow up with Pre-visit Labs.  Glade Lloyd, MD [facility] [address] [place],  [address] Phone: [phone]  Fax: [phone]   10/01/2018, 11:30 AM  This note was partially dictated with voice recognition software. Similar sounding words can be transcribed inadequately or may not  be corrected upon review.

## 2018-10-01 NOTE — Patient Instructions (Signed)

## 2018-11-26 ENCOUNTER — Encounter: Payer: Self-pay | Admitting: "Endocrinology

## 2018-11-26 ENCOUNTER — Ambulatory Visit (INDEPENDENT_AMBULATORY_CARE_PROVIDER_SITE_OTHER): Payer: Medicare Other | Admitting: "Endocrinology

## 2018-11-26 VITALS — BP 124/80 | HR 91 | Ht 65.5 in | Wt 281.0 lb

## 2018-11-26 DIAGNOSIS — E039 Hypothyroidism, unspecified: Secondary | ICD-10-CM

## 2018-11-26 MED ORDER — LEVOTHYROXINE SODIUM 112 MCG PO TABS: 112.0000 ug | ORAL_TABLET | Freq: Every day | ORAL | 3 refills | Status: DC

## 2018-11-26 NOTE — Progress Notes (Signed)
Endocrinology follow-up note                                         11/26/2018, 10:31 AM   Sheri Baker is a 68 y.o.-year-old female patient being seen in follow-up for hypothyroidism referred by Sudie Grumbling, DO (Inactive).   Past Medical History:  Diagnosis Date  . Anemia   . Arthritis   . Constipation   . Diabetes mellitus without complication (HCC)    Type II- not on medications  . Dysrhythmia    "palpations with full dose of hydrocodone"- takes 1/2 of Hydrocodone- Acetaminophen and does not have palpations  . GERD (gastroesophageal reflux disease)   . Headache    occasional  . Hemorrhoids   . History of hiatal hernia   . Hypertension   . Hypothyroidism    Past Surgical History:  Procedure Laterality Date  . ABDOMINAL HYSTERECTOMY     partial -right ovary remains  . BACK SURGERY    . cataract    . CERVIX REMOVAL    . COLONOSCOPY    . FOOT SURGERY Left    Callus- reaset bone  . NASAL SINUS SURGERY    . TONSILLECTOMY     Social History   Socioeconomic History  . Marital status: Single    Spouse name: Not on file  . Number of children: Not on file  . Years of education: Not on file  . Highest education level: Not on file  Occupational History  . Not on file  Social Needs  . Financial resource strain: Not on file  . Food insecurity:    Worry: Not on file    Inability: Not on file  . Transportation needs:    Medical: Not on file    Non-medical: Not on file  Tobacco Use  . Smoking status: Former Smoker    Packs/day: 0.25    Years: 15.00    Pack years: 3.75    Types: Cigarettes  . Smokeless tobacco: Former Systems developer  . Tobacco comment: quit approx age 2 e 67  Substance and Sexual Activity  . Alcohol use: Yes    Alcohol/week: 3.0 standard drinks    Types: 3 Glasses of wine per week  . Drug use: No  . Sexual activity: Not on file  Lifestyle  . Physical activity:    Days per  week: Not on file    Minutes per session: Not on file  . Stress: Not on file  Relationships  . Social connections:    Talks on phone: Not on file    Gets together: Not on file    Attends religious service: Not on file    Active member of club or organization: Not on file    Attends meetings of clubs or organizations: Not on file    Relationship status: Not on file  Other Topics Concern  . Not on file  Social History Narrative  . Not on file  Outpatient Encounter Medications as of 11/26/2018  Medication Sig  . cetirizine HCl (ZYRTEC) 5 MG/5ML SOLN Take 5 mg by mouth daily.  . CRESTOR 20 MG tablet Take 20 mg by mouth at bedtime.  . fexofenadine (ALLEGRA) 180 MG tablet Take 180 mg by mouth daily.  Marland Kitchen gabapentin (NEURONTIN) 800 MG tablet TAKE 1 TABLET BY MOUTH THREE TIMES DAILY (Patient not taking: Reported on 10/01/2018)  . hydrochlorothiazide (HYDRODIURIL) 25 MG tablet Take 25 mg by mouth daily.  Marland Kitchen levothyroxine (SYNTHROID) 112 MCG tablet Take 1 tablet (112 mcg total) by mouth daily before breakfast.  . losartan (COZAAR) 100 MG tablet   . methocarbamol (ROBAXIN) 750 MG tablet Take 1 tablet (750 mg total) by mouth 4 (four) times daily.  Marland Kitchen omeprazole (PRILOSEC) 20 MG capsule Take 20 mg by mouth daily.  . Polysaccharide Iron Complex (FERREX 150 PO) Take by mouth.  . Potassium Gluconate 550 MG TABS Take by mouth 2 (two) times daily.  . pregabalin (LYRICA) 100 MG capsule Take 100 mg by mouth 2 (two) times daily.  . Probiotic Product (PROBIOTIC DAILY PO) Take by mouth daily.  Marland Kitchen spironolactone (ALDACTONE) 25 MG tablet Take 25 mg by mouth daily.  Marland Kitchen VITAMIN D PO Take by mouth daily.  . [DISCONTINUED] rivaroxaban (XARELTO) 20 MG TABS tablet Take 20 mg by mouth daily.  . [DISCONTINUED] simvastatin (ZOCOR) 40 MG tablet Take 40 mg by mouth daily.  . [DISCONTINUED] SYNTHROID 125 MCG tablet Take 1 tablet (125 mcg total) by mouth daily before breakfast.   No facility-administered encounter  medications on file as of 11/26/2018.    ALLERGIES: Allergies  Allergen Reactions  . Hydrocodone Palpitations    Pt states this was only with high dose, tolerated and had good relief with low dose  . Penicillins Rash    Has patient had a PCN reaction causing immediate rash, facial/tongue/throat swelling, SOB or lightheadedness with hypotension: Yes Has patient had a PCN reaction causing severe rash involving mucus membranes or skin necrosis: No Has patient had a PCN reaction that required hospitalization No Has patient had a PCN reaction occurring within the last 10 years: No If all of the above answers are "NO", then may proceed with Cephalosporin use.    VACCINATION STATUS: Immunization History  Administered Date(s) Administered  . Pneumococcal Polysaccharide-23 03/28/2016     HPI    Sheri Baker  is a patient with the above medical history. she was diagnosed  with hypothyroidism at approximate age of 31 years with lab work after suggestive clinical symptoms.  She was switched to Synthroid from Armour during her last visit.  She is currently on Synthroid 125 mcg p.o. nightly.  She continues to feel better.  She has no new complaints today.    Pt denies feeling nodules in neck, hoarseness, dysphagia/odynophagia, SOB with lying down.  she denies family history of  thyroid disorders.  No family history of thyroid cancer.  No history of  radiation therapy to head or neck. No recent use of iodine supplements.  I reviewed her chart and she also has a history of type 2 diabetes with A1c of 6.3%, currently on no medications.   ROS:  Constitutional: + heavy weight, + fatigue, + subjective hypothermia Eyes: no blurry vision, no xerophthalmia ENT: no sore throat, no nodules palpated in throat, no dysphagia/odynophagia, no hoarseness Cardiovascular: no Chest Pain, no Shortness of Breath, no palpitations, no leg swelling Musculoskeletal: no muscle/joint aches Skin: no  rashes Neurological: no tremors, no numbness,  no tingling, no dizziness Psychiatric: no depression, no anxiety   Physical Exam: BP 124/80   Pulse 91   Ht 5' 5.5" (1.664 m)   Wt 281 lb (127.5 kg)   BMI 46.05 kg/m  Wt Readings from Last 3 Encounters:  11/26/18 281 lb (127.5 kg)  10/01/18 280 lb (127 kg)  04/17/16 281 lb 12 oz (127.8 kg)    Constitutional:  + Obese for height, not in acute distress, normal state of mind Eyes: PERRLA, EOMI, no exophthalmos ENT: moist mucous membranes, + palpable thyroid, no cervical lymphadenopathy Musculoskeletal: no gross deformities, strength intact in all four extremities Skin: moist, warm, no rashes Neurological: no tremor with outstretched hands, Deep tendon reflexes normal in all four extremities.   CMP ( most recent) CMP     Component Value Date/Time   NA 136 04/13/2016 0631   K 3.5 04/13/2016 0631   CL 98 (L) 04/13/2016 0631   CO2 29 04/13/2016 0631   GLUCOSE 109 (H) 04/13/2016 0631   BUN 10 07/22/2018   CREATININE 0.8 07/22/2018   CREATININE 0.75 04/13/2016 0631   CALCIUM 8.9 04/13/2016 0631   PROT 5.9 (L) 03/31/2016 0421   ALBUMIN 2.4 (L) 03/31/2016 0421   AST 18 03/31/2016 0421   ALT 18 03/31/2016 0421   ALKPHOS 53 03/31/2016 0421   BILITOT 0.6 03/31/2016 0421   GFRNONAA >60 04/13/2016 0631   GFRAA >60 04/13/2016 0631     Diabetic Labs (most recent): Lab Results  Component Value Date   HGBA1C 6.0 07/22/2018   HGBA1C 6.0 (H) 03/31/2016   HGBA1C 6.2 (H) 03/20/2016     Lipid Panel ( most recent) Lipid Panel     Component Value Date/Time   CHOL 148 07/22/2018   TRIG 92 07/22/2018   HDL 47 07/22/2018   LDLCALC 83 07/22/2018       Lab Results  Component Value Date   TSH 0.30 (A) 07/22/2018       ASSESSMENT: 1. Hypothyroidism  PLAN:    -Based on her previsit labs, she is benefiting from Synthroid treatment.   -Her labs are suggestive of slight overtreatment.   -I discussed and lowered her Synthroid  to 112 mcg p.o. every morning.     - We discussed about the correct intake of her thyroid hormone, on empty stomach at fasting, with water, separated by at least 30 minutes from breakfast and other medications,  and separated by more than 4 hours from calcium, iron, multivitamins, acid reflux medications (PPIs). -Patient is made aware of the fact that thyroid hormone replacement is needed for life, dose to be adjusted by periodic monitoring of thyroid function tests.   -Given palpable thyroid on physical exam, she will be considered for surveillance thyroid/neck ultrasound on subsequent visits. regarding type 2 diabetes with A1c of 6.3%: she Is not taking any medications. - Patient admits there is a room for improvement in her diet and drink choices. -  Suggestion is made for her to avoid simple carbohydrates  from her diet including Cakes, Sweet Desserts / Pastries, Ice Cream, Soda (diet and regular), Sweet Tea, Candies, Chips, Cookies, Store Bought Juices, Alcohol in Excess of  1-2 drinks a day, Artificial Sweeteners, and "Sugar-free" Products. This will help patient to have stable blood glucose profile and potentially avoid unintended weight gain.   Return in about 4 months (around 03/27/2019) for Follow up with Pre-visit Labs.  Glade Lloyd, MD [facility] [address] [place], [place]  [address] Phone: [phone]  Fax: [phone]   11/26/2018, 10:31 AM  This note was partially dictated with voice recognition software. Similar sounding words can be transcribed inadequately or may not  be corrected upon review.

## 2019-03-11 ENCOUNTER — Other Ambulatory Visit: Payer: Self-pay | Admitting: "Endocrinology

## 2019-03-20 LAB — TSH: TSH: 0.04 — AB (ref <=5.90)

## 2019-03-27 ENCOUNTER — Ambulatory Visit (INDEPENDENT_AMBULATORY_CARE_PROVIDER_SITE_OTHER): Payer: Medicare Other | Admitting: "Endocrinology

## 2019-03-27 ENCOUNTER — Encounter: Payer: Self-pay | Admitting: "Endocrinology

## 2019-03-27 ENCOUNTER — Other Ambulatory Visit: Payer: Self-pay | Admitting: "Endocrinology

## 2019-03-27 ENCOUNTER — Other Ambulatory Visit: Payer: Self-pay

## 2019-03-27 DIAGNOSIS — E039 Hypothyroidism, unspecified: Secondary | ICD-10-CM | POA: Diagnosis not present

## 2019-03-27 MED ORDER — LEVOTHYROXINE SODIUM 100 MCG PO TABS: 100.0000 ug | ORAL_TABLET | Freq: Every day | ORAL | 3 refills | Status: DC

## 2019-03-27 NOTE — Progress Notes (Addendum)
03/27/2019, 12:15 PM                                Endocrinology Telehealth Visit Follow up Note -During COVID -19 Pandemic  I connected with Sheri Baker on 03/27/2019   by telephone and verified that I am speaking with the correct person using two identifiers. Sheri Baker, 1951/05/03. she has verbally consented to this visit. All issues noted in this document were discussed and addressed. The format was not optimal for physical exam.    Sheri Baker is a 68 y.o.-year-old female patient being engaged in telehealth for follow-up for hypothyroidism referred by Sudie Grumbling, DO (Inactive).   Past Medical History:  Diagnosis Date  . Anemia   . Arthritis   . Constipation   . Diabetes mellitus without complication (HCC)    Type II- not on medications  . Dysrhythmia    "palpations with full dose of hydrocodone"- takes 1/2 of Hydrocodone- Acetaminophen and does not have palpations  . GERD (gastroesophageal reflux disease)   . Headache    occasional  . Hemorrhoids   . History of hiatal hernia   . Hypertension   . Hypothyroidism    Past Surgical History:  Procedure Laterality Date  . ABDOMINAL HYSTERECTOMY     partial -right ovary remains  . BACK SURGERY    . cataract    . CERVIX REMOVAL    . COLONOSCOPY    . FOOT SURGERY Left    Callus- reaset bone  . NASAL SINUS SURGERY    . TONSILLECTOMY     Social History   Socioeconomic History  . Marital status: Single    Spouse name: Not on file  . Number of children: Not on file  . Years of education: Not on file  . Highest education level: Not on file  Occupational History  . Not on file  Social Needs  . Financial resource strain: Not on file  . Food insecurity:    Worry: Not on file    Inability: Not on file  . Transportation needs:    Medical: Not on file    Non-medical: Not on file  Tobacco Use  . Smoking  status: Former Smoker    Packs/day: 0.25    Years: 15.00    Pack years: 3.75    Types: Cigarettes  . Smokeless tobacco: Former Systems developer  . Tobacco comment: quit approx age 49 e 66  Substance and Sexual Activity  . Alcohol use: Yes    Alcohol/week: 3.0 standard drinks    Types: 3 Glasses of wine per week  . Drug use: No  . Sexual activity: Not on file  Lifestyle  . Physical activity:    Days per week: Not on file    Minutes per session: Not on  file  . Stress: Not on file  Relationships  . Social connections:    Talks on phone: Not on file    Gets together: Not on file    Attends religious service: Not on file    Active member of club or organization: Not on file    Attends meetings of clubs or organizations: Not on file    Relationship status: Not on file  Other Topics Concern  . Not on file  Social History Narrative  . Not on file   Outpatient Encounter Medications as of 03/27/2019  Medication Sig  . cetirizine HCl (ZYRTEC) 5 MG/5ML SOLN Take 5 mg by mouth daily.  . CRESTOR 20 MG tablet Take 20 mg by mouth at bedtime.  . fexofenadine (ALLEGRA) 180 MG tablet Take 180 mg by mouth daily.  Marland Kitchen gabapentin (NEURONTIN) 800 MG tablet TAKE 1 TABLET BY MOUTH THREE TIMES DAILY (Patient not taking: Reported on 10/01/2018)  . hydrochlorothiazide (HYDRODIURIL) 25 MG tablet Take 25 mg by mouth daily.  Marland Kitchen levothyroxine (SYNTHROID) 100 MCG tablet Take 1 tablet (100 mcg total) by mouth daily before breakfast.  . losartan (COZAAR) 100 MG tablet   . methocarbamol (ROBAXIN) 750 MG tablet Take 1 tablet (750 mg total) by mouth 4 (four) times daily.  Marland Kitchen omeprazole (PRILOSEC) 20 MG capsule Take 20 mg by mouth daily.  . Polysaccharide Iron Complex (FERREX 150 PO) Take by mouth.  . Potassium Gluconate 550 MG TABS Take by mouth 2 (two) times daily.  . pregabalin (LYRICA) 100 MG capsule Take 100 mg by mouth 2 (two) times daily.  . Probiotic Product (PROBIOTIC DAILY PO) Take by mouth daily.  Marland Kitchen spironolactone  (ALDACTONE) 25 MG tablet Take 25 mg by mouth daily.  Marland Kitchen VITAMIN D PO Take by mouth daily.  . [DISCONTINUED] SYNTHROID 112 MCG tablet TAKE 1 TABLET BY MOUTH DAILY BEFORE BREAKFAST   No facility-administered encounter medications on file as of 03/27/2019.    ALLERGIES: Allergies  Allergen Reactions  . Hydrocodone Palpitations    Pt states this was only with high dose, tolerated and had good relief with low dose  . Penicillins Rash    Has patient had a PCN reaction causing immediate rash, facial/tongue/throat swelling, SOB or lightheadedness with hypotension: Yes Has patient had a PCN reaction causing severe rash involving mucus membranes or skin necrosis: No Has patient had a PCN reaction that required hospitalization No Has patient had a PCN reaction occurring within the last 10 years: No If all of the above answers are "NO", then may proceed with Cephalosporin use.    VACCINATION STATUS: Immunization History  Administered Date(s) Administered  . Pneumococcal Polysaccharide-23 03/28/2016     HPI    Sheri Baker  is a patient with the above medical history. she was diagnosed  with hypothyroidism at approximate age of 20 years with lab work after suggestive clinical symptoms.  She was switched to Synthroid from [place] during her last visit.  She is currently on Synthroid 112 mcg p.o. nightly.  She continues to feel better.  She has no new complaints today.    Pt denies feeling nodules in neck, hoarseness, dysphagia/odynophagia, SOB with lying down.  she denies family history of  thyroid disorders.  No family history of thyroid cancer.  No history of  radiation therapy to head or neck. No recent use of iodine supplements.  I reviewed her chart and she also has a history of type 2 diabetes with A1c of 6.3%, currently on no  medications.   ROS: Limited as above.   Physical Exam: There were no vitals taken for this visit. Wt Readings from Last 3 Encounters:  11/26/18 281 lb  (127.5 kg)  10/01/18 280 lb (127 kg)  04/17/16 281 lb 12 oz (127.8 kg)   CMP ( most recent) CMP     Component Value Date/Time   NA 136 04/13/2016 0631   K 3.5 04/13/2016 0631   CL 98 (L) 04/13/2016 0631   CO2 29 04/13/2016 0631   GLUCOSE 109 (H) 04/13/2016 0631   BUN 10 07/22/2018   CREATININE 0.8 07/22/2018   CREATININE 0.75 04/13/2016 0631   CALCIUM 8.9 04/13/2016 0631   PROT 5.9 (L) 03/31/2016 0421   ALBUMIN 2.4 (L) 03/31/2016 0421   AST 18 03/31/2016 0421   ALT 18 03/31/2016 0421   ALKPHOS 53 03/31/2016 0421   BILITOT 0.6 03/31/2016 0421   GFRNONAA >60 04/13/2016 0631   GFRAA >60 04/13/2016 0631     Diabetic Labs (most recent): Lab Results  Component Value Date   HGBA1C 6.0 07/22/2018   HGBA1C 6.0 (H) 03/31/2016   HGBA1C 6.2 (H) 03/20/2016     Lipid Panel ( most recent) Lipid Panel     Component Value Date/Time   CHOL 148 07/22/2018   TRIG 92 07/22/2018   HDL 47 07/22/2018   LDLCALC 83 07/22/2018       Lab Results  Component Value Date   TSH 0.04 (A) 03/20/2019   TSH 0.30 (A) 07/22/2018       ASSESSMENT: 1. Hypothyroidism  PLAN:    -Based on her previsit labs, she is benefiting from Synthroid treatment.   -Her labs are suggestive of slight overtreatment.   -I discussed and lowered her Synthroid to 100 mcg p.o. every morning.     - We discussed about the correct intake of her thyroid hormone, on empty stomach at fasting, with water, separated by at least 30 minutes from breakfast and other medications,  and separated by more than 4 hours from calcium, iron, multivitamins, acid reflux medications (PPIs). -Patient is made aware of the fact that thyroid hormone replacement is needed for life, dose to be adjusted by periodic monitoring of thyroid function tests.   -Given palpable thyroid on physical exam, she will be considered for surveillance thyroid/neck ultrasound on subsequent visits.  Regarding type 2 diabetes with A1c of 6.3%: she Is not  taking any medications. - she  admits there is a room for improvement in her diet and drink choices. -  Suggestion is made for her to avoid simple carbohydrates  from her diet including Cakes, Sweet Desserts / Pastries, Ice Cream, Soda (diet and regular), Sweet Tea, Candies, Chips, Cookies, Sweet Pastries,  Store Bought Juices, Alcohol in Excess of  1-2 drinks a day, Artificial Sweeteners, Coffee Creamer, and "Sugar-free" Products. This will help patient to have stable blood glucose profile and potentially avoid unintended weight gain.   Return in about 4 months (around 07/27/2019) for Follow up with Pre-visit Labs.   Time for this visit was 20 minutes.  Sheri Baker participated in the discussions, expressed understanding, and voiced agreement with the above plans.  All questions were answered to her satisfaction. she is encouraged to contact clinic should she have any questions or concerns prior to her return visit.  Glade Lloyd, MD [facility] [address] [place], [place] [address] Phone: [phone]  Fax: [phone]   03/27/2019, 12:15 PM  This note was partially  dictated with voice recognition software. Similar sounding words can be transcribed inadequately or may not  be corrected upon review.

## 2019-07-23 ENCOUNTER — Other Ambulatory Visit: Payer: Self-pay | Admitting: "Endocrinology

## 2019-07-24 LAB — VITAMIN D 25 HYDROXY (VIT D DEFICIENCY, FRACTURES): Vit D, 25-Hydroxy: 30

## 2019-07-24 LAB — TSH: TSH: 0.23 — AB (ref 0.41–5.90)

## 2019-07-31 ENCOUNTER — Ambulatory Visit (INDEPENDENT_AMBULATORY_CARE_PROVIDER_SITE_OTHER): Payer: Medicare Other | Admitting: "Endocrinology

## 2019-07-31 ENCOUNTER — Encounter: Payer: Self-pay | Admitting: "Endocrinology

## 2019-07-31 ENCOUNTER — Other Ambulatory Visit: Payer: Self-pay

## 2019-07-31 DIAGNOSIS — E039 Hypothyroidism, unspecified: Secondary | ICD-10-CM | POA: Diagnosis not present

## 2019-07-31 DIAGNOSIS — E119 Type 2 diabetes mellitus without complications: Secondary | ICD-10-CM

## 2019-07-31 MED ORDER — LEVOTHYROXINE SODIUM 100 MCG PO TABS: ORAL_TABLET | ORAL | 6 refills | Status: DC

## 2019-07-31 NOTE — Progress Notes (Signed)
07/31/2019, 12:13 PM                                Endocrinology Telehealth Visit Follow up Note -During COVID -19 Pandemic  I connected with [name] on 07/31/2019   by telephone and verified that I am speaking with the correct person using two identifiers. Sheri Baker, 10-03-1951. she has verbally consented to this visit. All issues noted in this document were discussed and addressed. The format was not optimal for physical exam.    Sheri Baker is a 68 y.o.-year-old female patient being engaged in telehealth for follow-up for hypothyroidism referred by Sudie Grumbling, DO (Inactive).   Past Medical History:  Diagnosis Date  . Anemia   . Arthritis   . Constipation   . Diabetes mellitus without complication (HCC)    Type II- not on medications  . Dysrhythmia    "palpations with full dose of hydrocodone"- takes 1/2 of Hydrocodone- Acetaminophen and does not have palpations  . GERD (gastroesophageal reflux disease)   . Headache    occasional  . Hemorrhoids   . History of hiatal hernia   . Hypertension   . Hypothyroidism    Past Surgical History:  Procedure Laterality Date  . ABDOMINAL HYSTERECTOMY     partial -right ovary remains  . BACK SURGERY    . cataract    . CERVIX REMOVAL    . COLONOSCOPY    . FOOT SURGERY Left    Callus- reaset bone  . NASAL SINUS SURGERY    . TONSILLECTOMY     Social History   Socioeconomic History  . Marital status: Single    Spouse name: Not on file  . Number of children: Not on file  . Years of education: Not on file  . Highest education level: Not on file  Occupational History  . Not on file  Social Needs  . Financial resource strain: Not on file  . Food insecurity    Worry: Not on file    Inability: Not on file  . Transportation needs    Medical: Not on file    Non-medical: Not on file  Tobacco Use  . Smoking  status: Former Smoker    Packs/day: 0.25    Years: 15.00    Pack years: 3.75    Types: Cigarettes  . Smokeless tobacco: Former Systems developer  . Tobacco comment: quit approx age 2 e 9  Substance and Sexual Activity  . Alcohol use: Yes    Alcohol/week: 3.0 standard drinks    Types: 3 Glasses of wine per week  . Drug use: No  . Sexual activity: Not on file  Lifestyle  . Physical activity    Days per week: Not on file    Minutes per session: Not on  file  . Stress: Not on file  Relationships  . Social Herbalist on phone: Not on file    Gets together: Not on file    Attends religious service: Not on file    Active member of club or organization: Not on file    Attends meetings of clubs or organizations: Not on file    Relationship status: Not on file  Other Topics Concern  . Not on file  Social History Narrative  . Not on file   Outpatient Encounter Medications as of 07/31/2019  Medication Sig  . cetirizine HCl (ZYRTEC) 5 MG/5ML SOLN Take 5 mg by mouth daily.  . CRESTOR 20 MG tablet Take 20 mg by mouth at bedtime.  . fexofenadine (ALLEGRA) 180 MG tablet Take 180 mg by mouth daily.  Marland Kitchen gabapentin (NEURONTIN) 800 MG tablet TAKE 1 TABLET BY MOUTH THREE TIMES DAILY (Patient not taking: Reported on 10/01/2018)  . hydrochlorothiazide (HYDRODIURIL) 25 MG tablet Take 25 mg by mouth daily.  Marland Kitchen levothyroxine (SYNTHROID) 100 MCG tablet TAKE 1 TABLET BY MOUTH EVERY DAY BEFORE breakfast  . losartan (COZAAR) 100 MG tablet   . methocarbamol (ROBAXIN) 750 MG tablet Take 1 tablet (750 mg total) by mouth 4 (four) times daily.  Marland Kitchen omeprazole (PRILOSEC) 20 MG capsule Take 20 mg by mouth daily.  . Polysaccharide Iron Complex (FERREX 150 PO) Take by mouth.  . Potassium Gluconate 550 MG TABS Take by mouth 2 (two) times daily.  . pregabalin (LYRICA) 100 MG capsule Take 100 mg by mouth 2 (two) times daily.  . Probiotic Product (PROBIOTIC DAILY PO) Take by mouth daily.  Marland Kitchen spironolactone (ALDACTONE) 25  MG tablet Take 25 mg by mouth daily.  Marland Kitchen VITAMIN D PO Take by mouth daily.  . [DISCONTINUED] SYNTHROID 100 MCG tablet TAKE 1 TABLET BY MOUTH EVERY DAY BEFORE breakfast   No facility-administered encounter medications on file as of 07/31/2019.    ALLERGIES: Allergies  Allergen Reactions  . Hydrocodone Palpitations    Pt states this was only with high dose, tolerated and had good relief with low dose  . Penicillins Rash    Has patient had a PCN reaction causing immediate rash, facial/tongue/throat swelling, SOB or lightheadedness with hypotension: Yes Has patient had a PCN reaction causing severe rash involving mucus membranes or skin necrosis: No Has patient had a PCN reaction that required hospitalization No Has patient had a PCN reaction occurring within the last 10 years: No If all of the above answers are "NO", then may proceed with Cephalosporin use.    VACCINATION STATUS: Immunization History  Administered Date(s) Administered  . Pneumococcal Polysaccharide-23 03/28/2016     HPI    Sheri Baker  is a patient with the above medical history. she was diagnosed  with hypothyroidism at approximate age of 68 years with lab work after suggestive clinical symptoms.  She is currently on Synthroid 100 mcg p.o. daily before breakfast.  She continues to tolerate treatment feels better.  She has no new complaints today.    She denies feeling nodules in neck, hoarseness, dysphagia/odynophagia, SOB with lying down.  she denies family history of  thyroid disorders.  No family history of thyroid cancer.  No history of  radiation therapy to head or neck. No recent use of iodine supplements. -She reports that during her recent annual physical her A1c was 5.2%.   ROS: Limited as above.   Physical Exam: There were no vitals taken for this visit. Wt  Readings from Last 3 Encounters:  11/26/18 281 lb (127.5 kg)  10/01/18 280 lb (127 kg)  04/17/16 281 lb 12 oz (127.8 kg)   CMP ( most  recent) CMP     Component Value Date/Time   NA 136 04/13/2016 0631   K 3.5 04/13/2016 0631   CL 98 (L) 04/13/2016 0631   CO2 29 04/13/2016 0631   GLUCOSE 109 (H) 04/13/2016 0631   BUN 10 07/22/2018   CREATININE 0.8 07/22/2018   CREATININE 0.75 04/13/2016 0631   CALCIUM 8.9 04/13/2016 0631   PROT 5.9 (L) 03/31/2016 0421   ALBUMIN 2.4 (L) 03/31/2016 0421   AST 18 03/31/2016 0421   ALT 18 03/31/2016 0421   ALKPHOS 53 03/31/2016 0421   BILITOT 0.6 03/31/2016 0421   GFRNONAA >60 04/13/2016 0631   GFRAA >60 04/13/2016 0631     Diabetic Labs (most recent): Lab Results  Component Value Date   HGBA1C 6.0 07/22/2018   HGBA1C 6.0 (H) 03/31/2016   HGBA1C 6.2 (H) 03/20/2016     Lipid Panel ( most recent) Lipid Panel     Component Value Date/Time   CHOL 148 07/22/2018   TRIG 92 07/22/2018   HDL 47 07/22/2018   LDLCALC 83 07/22/2018       Lab Results  Component Value Date   TSH 0.23 (A) 07/24/2019   TSH 0.04 (A) 03/20/2019   TSH 0.30 (A) 07/22/2018       ASSESSMENT: 1. Hypothyroidism  PLAN:   -Her previsit thyroid function tests are consistent with appropriate replacement. -She is advised to continue Synthroid 100 mcg p.o. daily before breakfast.   - We discussed about the correct intake of her thyroid hormone, on empty stomach at fasting, with water, separated by at least 30 minutes from breakfast and other medications,  and separated by more than 4 hours from calcium, iron, multivitamins, acid reflux medications (PPIs). -Patient is made aware of the fact that thyroid hormone replacement is needed for life, dose to be adjusted by periodic monitoring of thyroid function tests.  -Given palpable thyroid on physical exam, she will be considered for surveillance thyroid/neck ultrasound on subsequent visits.  Regarding type 2 diabetes  , she reports her A1c was 5.2% during her recent annual physical.  She is not taking any medications.     -  Suggestion is made for  her to avoid simple carbohydrates  from her diet including Cakes, Sweet Desserts / Pastries, Ice Cream, Soda (diet and regular), Sweet Tea, Candies, Chips, Cookies, Sweet Pastries,  Store Bought Juices, Alcohol in Excess of  1-2 drinks a day, Artificial Sweeteners, Coffee Creamer, and "Sugar-free" Products. This will help patient to have stable blood glucose profile and potentially avoid unintended weight gain.  She is advised to continue close follow-up with her PMD.  Return in about 6 months (around 01/29/2020) for Follow up with Pre-visit Labs.    Time for this visit: 15 minutes. Shriley Radick  participated in the discussions, expressed understanding, and voiced agreement with the above plans.  All questions were answered to her satisfaction. she is encouraged to contact clinic should she have any questions or concerns prior to her return visit.   Glade Lloyd, MD [facility] [address] [place], [place] [address] Phone: [phone]  Fax: [phone]   07/31/2019, 12:13 PM  This note was partially dictated with voice recognition software. Similar sounding words can be transcribed inadequately or may not  be corrected upon review.

## 2019-11-19 ENCOUNTER — Other Ambulatory Visit: Payer: Self-pay | Admitting: "Endocrinology

## 2020-01-29 ENCOUNTER — Ambulatory Visit: Payer: Medicare Other | Admitting: "Endocrinology

## 2020-02-18 ENCOUNTER — Telehealth: Payer: Self-pay | Admitting: "Endocrinology

## 2020-02-18 NOTE — Telephone Encounter (Signed)
Received medical record request, faxed to medical records on 4/29 for them to process

## 2020-03-04 ENCOUNTER — Telehealth: Payer: Self-pay | Admitting: "Endocrinology

## 2020-03-04 NOTE — Telephone Encounter (Signed)
Received PPW from [place]- per Dr Dorris Fetch, he can not fill that out. Provided no fax number for [place] to fax back.

## 2020-03-10 ENCOUNTER — Telehealth: Payer: Self-pay | Admitting: "Endocrinology

## 2020-03-10 MED ORDER — LEVOTHYROXINE SODIUM 100 MCG PO TABS: ORAL_TABLET | ORAL | 0 refills | Status: DC

## 2020-03-10 NOTE — Telephone Encounter (Signed)
Rx sent 

## 2020-03-10 NOTE — Telephone Encounter (Signed)
Pt requesting refill on her thyroid medication. She did labs and has an appt in June

## 2020-03-25 ENCOUNTER — Other Ambulatory Visit: Payer: Self-pay | Admitting: "Endocrinology

## 2020-04-05 ENCOUNTER — Other Ambulatory Visit: Payer: Self-pay

## 2020-04-05 ENCOUNTER — Ambulatory Visit (INDEPENDENT_AMBULATORY_CARE_PROVIDER_SITE_OTHER): Payer: Medicare Other | Admitting: "Endocrinology

## 2020-04-05 ENCOUNTER — Encounter: Payer: Self-pay | Admitting: "Endocrinology

## 2020-04-05 VITALS — BP 115/74 | HR 96 | Ht 65.0 in | Wt 263.0 lb

## 2020-04-05 DIAGNOSIS — E039 Hypothyroidism, unspecified: Secondary | ICD-10-CM

## 2020-04-05 DIAGNOSIS — E119 Type 2 diabetes mellitus without complications: Secondary | ICD-10-CM | POA: Diagnosis not present

## 2020-04-05 MED ORDER — LEVOTHYROXINE SODIUM 100 MCG PO TABS: ORAL_TABLET | ORAL | 1 refills | Status: DC

## 2020-04-05 NOTE — Progress Notes (Signed)
04/05/2020, 10:26 AM                           Endocrinology follow-up note\    Sheri Baker is a 69 y.o.-year-old female patient being seen in follow-up for hypothyroidism, prediabetes.  YIF:OYDXAJO, Edwin, DO (Inactive).   Past Medical History:  Diagnosis Date  . Anemia   . Arthritis   . Constipation   . Diabetes mellitus without complication (HCC)    Type II- not on medications  . Dysrhythmia    "palpations with full dose of hydrocodone"- takes 1/2 of Hydrocodone- Acetaminophen and does not have palpations  . GERD (gastroesophageal reflux disease)   . Headache    occasional  . Hemorrhoids   . History of hiatal hernia   . Hypertension   . Hypothyroidism    Past Surgical History:  Procedure Laterality Date  . ABDOMINAL HYSTERECTOMY     partial -right ovary remains  . BACK SURGERY    . cataract    . CERVIX REMOVAL    . COLONOSCOPY    . FOOT SURGERY Left    Callus- reaset bone  . NASAL SINUS SURGERY    . TONSILLECTOMY     Social History   Socioeconomic History  . Marital status: Single    Spouse name: Not on file  . Number of children: Not on file  . Years of education: Not on file  . Highest education level: Not on file  Occupational History  . Not on file  Tobacco Use  . Smoking status: Former Smoker    Packs/day: 0.25    Years: 15.00    Pack years: 3.75    Types: Cigarettes  . Smokeless tobacco: Former Systems developer  . Tobacco comment: quit approx age 64 e 5  Vaping Use  . Vaping Use: Never used  Substance and Sexual Activity  . Alcohol use: Yes    Alcohol/week: 3.0 standard drinks    Types: 3 Glasses of wine per week  . Drug use: No  . Sexual activity: Not on file  Other Topics Concern  . Not on file  Social History Narrative  . Not on file   Social Determinants of Health   Financial Resource Strain:   . Difficulty of Paying Living  Expenses:   Food Insecurity:   . Worried About Charity fundraiser in the Last Year:   . Arboriculturist in the Last Year:   Transportation Needs:   . Film/video editor (Medical):   Marland Kitchen Lack of Transportation (Non-Medical):   Physical Activity:   . Days of Exercise per Week:   . Minutes of Exercise per Session:   Stress:   . Feeling of Stress :   Social Connections:   . Frequency of Communication with Friends and Family:   .  Frequency of Social Gatherings with Friends and Family:   . Attends Religious Services:   . Active Member of Clubs or Organizations:   . Attends Archivist Meetings:   Marland Kitchen Marital Status:    Outpatient Encounter Medications as of 04/05/2020  Medication Sig  . DULoxetine (CYMBALTA) 30 MG capsule Take 30 mg by mouth 2 (two) times daily.  . cetirizine HCl (ZYRTEC) 5 MG/5ML SOLN Take 5 mg by mouth daily.  . CRESTOR 20 MG tablet Take 20 mg by mouth at bedtime.  . fexofenadine (ALLEGRA) 180 MG tablet Take 180 mg by mouth daily.  Marland Kitchen gabapentin (NEURONTIN) 800 MG tablet TAKE 1 TABLET BY MOUTH THREE TIMES DAILY (Patient not taking: Reported on 10/01/2018)  . hydrochlorothiazide (HYDRODIURIL) 25 MG tablet Take 25 mg by mouth daily.  Marland Kitchen levothyroxine (SYNTHROID) 100 MCG tablet TAKE 1 TABLET BY MOUTH EVERY DAY BEFORE breakfast  . losartan (COZAAR) 100 MG tablet   . methocarbamol (ROBAXIN) 750 MG tablet Take 1 tablet (750 mg total) by mouth 4 (four) times daily.  Marland Kitchen omeprazole (PRILOSEC) 20 MG capsule Take 20 mg by mouth daily.  . Polysaccharide Iron Complex (FERREX 150 PO) Take by mouth.  . Potassium Gluconate 550 MG TABS Take by mouth 2 (two) times daily.  . pregabalin (LYRICA) 100 MG capsule Take 100 mg by mouth 2 (two) times daily.  . Probiotic Product (PROBIOTIC DAILY PO) Take by mouth daily.  Marland Kitchen spironolactone (ALDACTONE) 25 MG tablet Take 25 mg by mouth daily.  Marland Kitchen VITAMIN D PO Take by mouth daily.  . [DISCONTINUED] levothyroxine (SYNTHROID) 100 MCG tablet  TAKE 1 TABLET BY MOUTH EVERY DAY BEFORE breakfast   No facility-administered encounter medications on file as of 04/05/2020.   ALLERGIES: Allergies  Allergen Reactions  . Hydrocodone Palpitations    Pt states this was only with high dose, tolerated and had good relief with low dose  . Penicillins Rash    Has patient had a PCN reaction causing immediate rash, facial/tongue/throat swelling, SOB or lightheadedness with hypotension: Yes Has patient had a PCN reaction causing severe rash involving mucus membranes or skin necrosis: No Has patient had a PCN reaction that required hospitalization No Has patient had a PCN reaction occurring within the last 10 years: No If all of the above answers are "NO", then may proceed with Cephalosporin use.    VACCINATION STATUS: Immunization History  Administered Date(s) Administered  . Pneumococcal Polysaccharide-23 03/28/2016     HPI    Sheri Baker  is a patient with the above medical history. she was diagnosed  with hypothyroidism at approximate age of 81 years with lab work after suggestive clinical symptoms.  She is currently on Synthroid 100 mcg p.o. daily before breakfast.  He continues to tolerate this medication.  She has no new complaints today.  Due to some dietary change, she has lost  18 pounds recently.   She denies palpitations, tremors, nor dysphagia.   she denies family history of  thyroid disorders.  No family history of thyroid cancer.  No history of  radiation therapy to head or neck. No recent use of iodine supplements. -She has history of prediabetes with A1c of 6.3%, presently 6%.  She is not on intervention.   ROS: Limited as above.   Physical Exam: BP 115/74   Pulse 96   Ht 5\' 5"  (1.651 m)   Wt 263 lb (119.3 kg)   BMI 43.77 kg/m  Wt Readings from Last 3 Encounters:  04/05/20  263 lb (119.3 kg)  11/26/18 281 lb (127.5 kg)  10/01/18 280 lb (127 kg)     Physical Exam- Limited  Constitutional:  Body mass  index is 43.77 kg/m. , not in acute distress, normal state of mind Eyes:  EOMI, no exophthalmos Neck: Supple Thyroid:  + goiter Respiratory: Adequate breathing efforts Musculoskeletal: no gross deformities, strength intact in all four extremities, no gross restriction of joint movements Skin:  no rashes, no hyperemia Neurological: no tremor with outstretched hands,   CMP ( most recent) CMP     Component Value Date/Time   NA 136 04/13/2016 0631   K 3.5 04/13/2016 0631   CL 98 (L) 04/13/2016 0631   CO2 29 04/13/2016 0631   GLUCOSE 109 (H) 04/13/2016 0631   BUN 10 07/22/2018 0000   CREATININE 0.8 07/22/2018 0000   CREATININE 0.75 04/13/2016 0631   CALCIUM 8.9 04/13/2016 0631   PROT 5.9 (L) 03/31/2016 0421   ALBUMIN 2.4 (L) 03/31/2016 0421   AST 18 03/31/2016 0421   ALT 18 03/31/2016 0421   ALKPHOS 53 03/31/2016 0421   BILITOT 0.6 03/31/2016 0421   GFRNONAA >60 04/13/2016 0631   GFRAA >60 04/13/2016 0631     Diabetic Labs (most recent): Lab Results  Component Value Date   HGBA1C 6.0 07/22/2018   HGBA1C 6.0 (H) 03/31/2016   HGBA1C 6.2 (H) 03/20/2016     Lipid Panel ( most recent) Lipid Panel     Component Value Date/Time   CHOL 148 07/22/2018 0000   TRIG 92 07/22/2018 0000   HDL 47 07/22/2018 0000   LDLCALC 83 07/22/2018 0000       Lab Results  Component Value Date   TSH 0.23 (A) 07/24/2019   TSH 0.04 (A) 03/20/2019   TSH 0.30 (A) 07/22/2018       ASSESSMENT: 1. Hypothyroidism 2.  Diabetes 3.  Goiter PLAN:   -Her previsit thyroid function tests are consistent with appropriate replacement.  She is advised to continue Synthroid 100 mcg p.o. daily before breakfast.   - We discussed about the correct intake of her thyroid hormone, on empty stomach at fasting, with water, separated by at least 30 minutes from breakfast and other medications,  and separated by more than 4 hours from calcium, iron, multivitamins, acid reflux medications (PPIs). -Patient is  made aware of the fact that thyroid hormone replacement is needed for life, dose to be adjusted by periodic monitoring of thyroid function tests.   -Given palpable thyroid on physical exam, she is  considered for surveillance thyroid/neck ultrasound on subsequent visits.  Regarding her prediabetes with A1c of 6%, she is encouraged to continue on her lifestyle change.  She is not on any medication intervention.  - she  admits there is a room for improvement in her diet and drink choices. -  Suggestion is made for her to avoid simple carbohydrates  from her diet including Cakes, Sweet Desserts / Pastries, Ice Cream, Soda (diet and regular), Sweet Tea, Candies, Chips, Cookies, Sweet Pastries,  Store Bought Juices, Alcohol in Excess of  1-2 drinks a day, Artificial Sweeteners, Coffee Creamer, and "Sugar-free" Products. This will help patient to have stable blood glucose profile and potentially avoid unintended weight gain.  She is advised to continue close follow-up with her PMD.  Return in about 6 months (around 10/05/2020) for F/U with Pre-visit Labs.      - Time spent on this patient care encounter:  20 minutes of which 50% was spent in  counseling and the rest reviewing  her current and  previous labs / studies and medications  doses and developing a plan for long term care. Sheri Baker  participated in the discussions, expressed understanding, and voiced agreement with the above plans.  All questions were answered to her satisfaction. she is encouraged to contact clinic should she have any questions or concerns prior to her return visit.   Glade Lloyd, MD [facility] [address] [place], [place] [address] Phone: [phone]  Fax: [phone]   04/05/2020, 10:26 AM  This note was partially dictated with voice recognition software. Similar sounding words can be transcribed inadequately or may not  be corrected upon review.

## 2020-04-05 NOTE — Patient Instructions (Signed)

## 2020-04-14 ENCOUNTER — Encounter: Payer: Self-pay | Admitting: "Endocrinology

## 2020-08-04 LAB — TSH: TSH: 0.19 — AB (ref 0.41–5.90)

## 2020-10-05 ENCOUNTER — Ambulatory Visit: Payer: Medicare Other | Admitting: "Endocrinology

## 2020-10-27 ENCOUNTER — Other Ambulatory Visit: Payer: Self-pay | Admitting: "Endocrinology

## 2020-12-07 ENCOUNTER — Other Ambulatory Visit: Payer: Self-pay | Admitting: "Endocrinology

## 2020-12-14 ENCOUNTER — Other Ambulatory Visit: Payer: Self-pay

## 2020-12-14 ENCOUNTER — Telehealth: Payer: Self-pay | Admitting: "Endocrinology

## 2020-12-14 ENCOUNTER — Other Ambulatory Visit: Payer: Self-pay | Admitting: "Endocrinology

## 2020-12-14 DIAGNOSIS — E039 Hypothyroidism, unspecified: Secondary | ICD-10-CM

## 2020-12-14 NOTE — Telephone Encounter (Signed)
Pt called to sch her appt, she is going to call back once she gets her labs done.

## 2020-12-30 LAB — TSH: TSH: 0.21 — AB (ref 0.41–5.90)

## 2021-01-09 ENCOUNTER — Encounter: Payer: Self-pay | Admitting: "Endocrinology

## 2021-01-09 ENCOUNTER — Other Ambulatory Visit: Payer: Self-pay

## 2021-01-09 ENCOUNTER — Ambulatory Visit: Payer: Medicare Other | Admitting: "Endocrinology

## 2021-01-09 VITALS — BP 122/84 | HR 103 | Ht 65.0 in | Wt 279.8 lb

## 2021-01-09 DIAGNOSIS — E042 Nontoxic multinodular goiter: Secondary | ICD-10-CM | POA: Diagnosis not present

## 2021-01-09 DIAGNOSIS — E039 Hypothyroidism, unspecified: Secondary | ICD-10-CM

## 2021-01-09 DIAGNOSIS — E119 Type 2 diabetes mellitus without complications: Secondary | ICD-10-CM

## 2021-01-09 LAB — POCT GLYCOSYLATED HEMOGLOBIN (HGB A1C): HbA1c, POC (controlled diabetic range): 6.2 % (ref 0.0–7.0)

## 2021-01-09 MED ORDER — LEVOTHYROXINE SODIUM 100 MCG PO TABS: ORAL_TABLET | ORAL | 1 refills | Status: DC

## 2021-01-09 NOTE — Progress Notes (Signed)
01/09/2021, 1:32 PM                           Endocrinology follow-up note\    Sheri Baker is a 70 y.o.-year-old female patient being seen in follow-up for hypothyroidism, prediabetes.  [id number], Edwin, DO (Inactive).   Past Medical History:  Diagnosis Date  . Anemia   . Arthritis   . Constipation   . Diabetes mellitus without complication (HCC)    Type II- not on medications  . Dysrhythmia    "palpations with full dose of hydrocodone"- takes 1/2 of Hydrocodone- Acetaminophen and does not have palpations  . GERD (gastroesophageal reflux disease)   . Headache    occasional  . Hemorrhoids   . History of hiatal hernia   . Hypertension   . Hypothyroidism    Past Surgical History:  Procedure Laterality Date  . ABDOMINAL HYSTERECTOMY     partial -right ovary remains  . BACK SURGERY    . cataract    . CERVIX REMOVAL    . COLONOSCOPY    . FOOT SURGERY Left    Callus- reaset bone  . NASAL SINUS SURGERY    . TONSILLECTOMY     Social History   Socioeconomic History  . Marital status: Single    Spouse name: Not on file  . Number of children: Not on file  . Years of education: Not on file  . Highest education level: Not on file  Occupational History  . Not on file  Tobacco Use  . Smoking status: Former Smoker    Packs/day: 0.25    Years: 15.00    Pack years: 3.75    Types: Cigarettes  . Smokeless tobacco: Former Systems developer  . Tobacco comment: quit approx age 49 e 59  Vaping Use  . Vaping Use: Never used  Substance and Sexual Activity  . Alcohol use: Yes    Alcohol/week: 3.0 standard drinks    Types: 3 Glasses of wine per week  . Drug use: No  . Sexual activity: Not on file  Other Topics Concern  . Not on file  Social History Narrative  . Not on file   Social Determinants of Health   Financial Resource Strain: Not on file  Food Insecurity: Not  on file  Transportation Needs: Not on file  Physical Activity: Not on file  Stress: Not on file  Social Connections: Not on file   Outpatient Encounter Medications as of 01/09/2021  Medication Sig  . gabapentin (NEURONTIN) 800 MG tablet TAKE 1 TABLET BY MOUTH THREE TIMES DAILY  . cetirizine HCl (ZYRTEC) 5 MG/5ML SOLN Take 5 mg by mouth daily.  . CRESTOR 20 MG tablet Take 20 mg by mouth at bedtime.  . DULoxetine (CYMBALTA) 30 MG capsule Take 30 mg by mouth 2 (two)  times daily.  . fexofenadine (ALLEGRA) 180 MG tablet Take 180 mg by mouth daily as needed.  . folic acid (FOLVITE) 1 MG tablet Take 1 mg by mouth daily.  . hydrochlorothiazide (HYDRODIURIL) 25 MG tablet Take 25 mg by mouth daily.  Marland Kitchen levothyroxine (SYNTHROID) 100 MCG tablet TAKE 1 TABLET BY MOUTH EVERY DAY BEFORE breakfast  . losartan (COZAAR) 100 MG tablet   . methocarbamol (ROBAXIN) 750 MG tablet Take 1 tablet (750 mg total) by mouth 4 (four) times daily.  . methotrexate (RHEUMATREX) 2.5 MG tablet Take 5 tablets by mouth once a week.  Marland Kitchen omeprazole (PRILOSEC) 20 MG capsule Take 20 mg by mouth daily.  . Polysaccharide Iron Complex (FERREX 150 PO) Take by mouth. (Patient not taking: Reported on 01/09/2021)  . Potassium Gluconate 550 MG TABS Take by mouth 2 (two) times daily.  . predniSONE (DELTASONE) 10 MG tablet Take 10 mg by mouth daily.  . pregabalin (LYRICA) 100 MG capsule Take 100 mg by mouth 2 (two) times daily. (Patient not taking: Reported on 01/09/2021)  . Probiotic Product (PROBIOTIC DAILY PO) Take by mouth daily as needed.  Marland Kitchen spironolactone (ALDACTONE) 25 MG tablet Take 25 mg by mouth daily.  Marland Kitchen VITAMIN D PO Take by mouth daily. (Patient not taking: Reported on 01/09/2021)  . [DISCONTINUED] levothyroxine (SYNTHROID) 100 MCG tablet TAKE 1 TABLET BY MOUTH EVERY DAY BEFORE breakfast   No facility-administered encounter medications on file as of 01/09/2021.   ALLERGIES: Allergies  Allergen Reactions  . Mango Flavor   .  Hydrocodone Palpitations    Pt states this was only with high dose, tolerated and had good relief with low dose  . Penicillins Rash    Has patient had a PCN reaction causing immediate rash, facial/tongue/throat swelling, SOB or lightheadedness with hypotension: Yes Has patient had a PCN reaction causing severe rash involving mucus membranes or skin necrosis: No Has patient had a PCN reaction that required hospitalization No Has patient had a PCN reaction occurring within the last 10 years: No If all of the above answers are "NO", then may proceed with Cephalosporin use.    VACCINATION STATUS: Immunization History  Administered Date(s) Administered  . Pneumococcal Polysaccharide-23 03/28/2016     HPI    Sheri Baker  is a patient with the above medical history. she was diagnosed  with hypothyroidism at approximate age of 62 years with lab work after suggestive clinical symptoms.  She was seen in June 2021, was continued on Synthroid 100 mcg p.o. daily before breakfast.  She did not keep her appointment in December, ran out of her Synthroid a month ago.  Her previsit labs show inadequate replacement.  She does not have acute complaints today.  She has steady weight since last visit.   She denies palpitations, tremors, nor dysphagia.   she denies family history of  thyroid disorders.  No family history of thyroid cancer.  No history of  radiation therapy to head or neck. No recent use of iodine supplements. -She has history of prediabetes with A1c of 6.3%, presently 6.2 %.  She is not on intervention.   ROS: Limited as above.   Physical Exam: BP 122/84   Pulse (!) 103   Ht 5\' 5"  (1.651 m)   Wt 279 lb 12.8 oz (126.9 kg)   BMI 46.56 kg/m  Wt Readings from Last 3 Encounters:  01/09/21 279 lb 12.8 oz (126.9 kg)  04/05/20 263 lb (119.3 kg)  11/26/18 281 lb (127.5 kg)  Physical Exam- Limited  Constitutional:  Body mass index is 46.56 kg/m. , not in acute distress,  normal state of mind   CMP ( most recent) CMP     Component Value Date/Time   NA 136 04/13/2016 0631   K 3.5 04/13/2016 0631   CL 98 (L) 04/13/2016 0631   CO2 29 04/13/2016 0631   GLUCOSE 109 (H) 04/13/2016 0631   BUN 10 07/22/2018 0000   CREATININE 0.8 07/22/2018 0000   CREATININE 0.75 04/13/2016 0631   CALCIUM 8.9 04/13/2016 0631   PROT 5.9 (L) 03/31/2016 0421   ALBUMIN 2.4 (L) 03/31/2016 0421   AST 18 03/31/2016 0421   ALT 18 03/31/2016 0421   ALKPHOS 53 03/31/2016 0421   BILITOT 0.6 03/31/2016 0421   GFRNONAA >60 04/13/2016 0631   GFRAA >60 04/13/2016 0631     Diabetic Labs (most recent): Lab Results  Component Value Date   HGBA1C 6.2 01/09/2021   HGBA1C 6.0 07/22/2018   HGBA1C 6.0 (H) 03/31/2016     Lipid Panel ( most recent) Lipid Panel     Component Value Date/Time   CHOL 148 07/22/2018 0000   TRIG 92 07/22/2018 0000   HDL 47 07/22/2018 0000   LDLCALC 83 07/22/2018 0000       Lab Results  Component Value Date   TSH 0.21 (A) 12/30/2020   TSH 0.19 (A) 07/29/2020   TSH 0.23 (A) 07/24/2019   TSH 0.04 (A) 03/20/2019   TSH 0.30 (A) 07/22/2018       ASSESSMENT: 1.  Hypothyroidism 2.  Prediabetes 3.  Multiple thyroid nodule  PLAN:   -Her previsit thyroid function tests are consistent with inadequate replacement, mainly due to treatment withdrawal.  She will be resumed on the same dose of Synthroid 100 mcg p.o. daily before breakfast.    - We discussed about the correct intake of her thyroid hormone, on empty stomach at fasting, with water, separated by at least 30 minutes from breakfast and other medications,  and separated by more than 4 hours from calcium, iron, multivitamins, acid reflux medications (PPIs). -Patient is made aware of the fact that thyroid hormone replacement is needed for life, dose to be adjusted by periodic monitoring of thyroid function tests.  -Given palpable thyroid on physical exam, she underwent thyroid ultrasound showing  subcentimeter cystic nodules bilaterally.  Her thyroid is not enlarged, consistent with heterogeneous echotexture.    Regarding her prediabetes with A1c of 6.2%, she is encouraged to continue on her lifestyle change.  She is not on any medication intervention. - she acknowledges that there is a room for improvement in her food and drink choices. - Suggestion is made for her to avoid simple carbohydrates  from her diet including Cakes, Sweet Desserts, Ice Cream, Soda (diet and regular), Sweet Tea, Candies, Chips, Cookies, Store Bought Juices, Alcohol in Excess of  1-2 drinks a day, Artificial Sweeteners,  Coffee Creamer, and "Sugar-free" Products, Lemonade. This will help patient to have more stable blood glucose profile and potentially avoid unintended weight gain.    She is advised to continue close follow-up with her PMD.  Return in about 24 weeks (around 06/26/2021) for F/U with Pre-visit Labs.    - Time spent on this patient care encounter:  30 minutes of which 50% was spent in  counseling and the rest reviewing  her current and  previous labs / studies and medications  doses and developing a plan for long term care, and documenting this care. Zacari Gillin  participated in the discussions, expressed understanding, and voiced agreement with the above plans.  All questions were answered to her satisfaction. she is encouraged to contact clinic should she have any questions or concerns prior to her return visit.   Glade Lloyd, MD [facility] [address] [place], [place] [address] Phone: [phone]  Fax: [phone]   01/09/2021, 1:32 PM  This note was partially dictated with voice recognition software. Similar sounding words can be transcribed inadequately or may not  be corrected upon review.

## 2021-01-09 NOTE — Patient Instructions (Signed)
                                     Advice for Weight Management  -For most of us the best way to lose weight is by diet management. Generally speaking, diet management means consuming less calories intentionally which over time brings about progressive weight loss.  This can be achieved more effectively by restricting carbohydrate consumption to the minimum possible.  So, it is critically important to know your numbers: how much calorie you are consuming and how much calorie you need. More importantly, our carbohydrates sources should be unprocessed or minimally processed complex starch food items.   Sometimes, it is important to balance nutrition by increasing protein intake (animal or plant source), fruits, and vegetables.  -Sticking to a routine mealtime to eat 3 meals a day and avoiding unnecessary snacks is shown to have a big role in weight control. Under normal circumstances, the only time we lose real weight is when we are hungry, so allow hunger to take place- hunger means no food between meal times, only water.  It is not advisable to starve.   -It is better to avoid simple carbohydrates including: Cakes, Sweet Desserts, Ice Cream, Soda (diet and regular), Sweet Tea, Candies, Chips, Cookies, Store Bought Juices, Alcohol in Excess of  1-2 drinks a day, Lemonade,  Artificial Sweeteners, Doughnuts, Coffee Creamers, "Sugar-free" Products, etc, etc.  This is not a complete list.....    -Consulting with certified diabetes educators is proven to provide you with the most accurate and current information on diet.  Also, you may be  interested in discussing diet options/exchanges , we can schedule a visit with Sheri Baker, RDN, CDE for individualized nutrition education.  -Exercise: If you are able: 30 -60 minutes a day ,4 days a week, or 150 minutes a week.  The longer the better.  Combine stretch, strength, and aerobic activities.  If you were told in the  past that you have high risk for cardiovascular diseases, you may seek evaluation by your heart doctor prior to initiating moderate to intense exercise programs.                                  Additional Care Considerations for Diabetes   -Diabetes  is a chronic disease.  The most important care consideration is regular follow-up with your diabetes care provider with the goal being avoiding or delaying its complications and to take advantage of advances in medications and technology.    -Type 2 diabetes is known to coexist with other important comorbidities such as high blood pressure and high cholesterol.  It is critical to control not only the diabetes but also the high blood pressure and high cholesterol to minimize and delay the risk of complications including coronary artery disease, stroke, amputations, blindness, etc.    - Studies showed that people with diabetes will benefit from a class of medications known as ACE inhibitors and statins.  Unless there are specific reasons not to be on these medications, the standard of care is to consider getting one from these groups of medications at an optimal doses.  These medications are generally considered safe and proven to help protect the heart and the kidneys.    - People with diabetes are encouraged to initiate and maintain regular follow-up with eye doctors, foot   doctors, dentists , and if necessary heart and kidney doctors.     - It is highly recommended that people with diabetes quit smoking or stay away from smoking, and get yearly  flu vaccine and pneumonia vaccine at least every 5 years.  One other important lifestyle recommendation is to ensure adequate sleep - at least 6-7 hours of uninterrupted sleep at night.  -Exercise: If you are able: 30 -60 minutes a day, 4 days a week, or 150 minutes a week.  The longer the better.  Combine stretch, strength, and aerobic activities.  If you were told in the past that you have high risk for  cardiovascular diseases, you may seek evaluation by your heart doctor prior to initiating moderate to intense exercise programs.          

## 2021-02-01 ENCOUNTER — Other Ambulatory Visit: Payer: Self-pay

## 2021-02-01 DIAGNOSIS — E039 Hypothyroidism, unspecified: Secondary | ICD-10-CM

## 2021-02-01 MED ORDER — LEVOTHYROXINE SODIUM 100 MCG PO TABS: ORAL_TABLET | ORAL | 1 refills | Status: DC

## 2021-02-20 ENCOUNTER — Other Ambulatory Visit: Payer: Self-pay | Admitting: Orthopedic Surgery

## 2021-02-20 DIAGNOSIS — M67912 Unspecified disorder of synovium and tendon, left shoulder: Secondary | ICD-10-CM

## 2021-03-17 LAB — TSH: TSH: 0.2 — AB (ref 0.41–5.90)

## 2021-04-03 ENCOUNTER — Ambulatory Visit
Admission: RE | Admit: 2021-04-03 | Discharge: 2021-04-03 | Disposition: A | Discharge status: Home / Self Care | Payer: Medicare Other | Source: Ambulatory Visit | Attending: Orthopedic Surgery | Admitting: Orthopedic Surgery

## 2021-04-03 ENCOUNTER — Other Ambulatory Visit: Payer: Self-pay

## 2021-04-03 DIAGNOSIS — M67912 Unspecified disorder of synovium and tendon, left shoulder: Secondary | ICD-10-CM

## 2021-06-07 NOTE — Progress Notes (Signed)
Sent message, via epic in basket, requesting orders in epic from surgeon.  

## 2021-06-15 NOTE — Patient Instructions (Signed)
DUE TO COVID-19 ONLY ONE VISITOR IS ALLOWED TO COME WITH YOU AND STAY IN THE WAITING ROOM ONLY DURING PRE OP AND PROCEDURE.   **NO VISITORS ARE ALLOWED IN THE SHORT STAY AREA OR RECOVERY ROOM!!**  IF YOU WILL BE ADMITTED INTO THE HOSPITAL YOU ARE ALLOWED ONLY TWO SUPPORT PEOPLE DURING VISITATION HOURS ONLY (10AM -8PM)   The support person(s) may change daily. The support person(s) must pass our screening, gel in and out, and wear a mask at all times, including in the patient's room. Patients must also wear a mask when staff or their support person are in the room.  No visitors under the age of 7. Any visitor under the age of 74 must be accompanied by an adult.        Your procedure is scheduled on: 06/29/21   Report to [facility] Main  Entrance   Report to Short Stay at 5:15 AM   [facility])    Call this number if you have problems the morning of surgery [phone]   Do not eat food :After Midnight.   May have liquids until : 4:30 AM   day of surgery  CLEAR LIQUID DIET  Foods Allowed                                                                     Foods Excluded  Water, Black Coffee and tea, regular and decaf                             liquids that you cannot  Plain Jell-O in any flavor  (No red)                                           see through such as: Fruit ices (not with fruit pulp)                                     milk, soups, orange juice              Iced Popsicles (No red)                                    All solid food                                   Apple juices Sports drinks like Gatorade (No red) Lightly seasoned clear broth or consume(fat free) Sugar  Sample Menu Breakfast                                Lunch                                     Supper Cranberry juice  Beef broth                            Chicken broth Jell-O                                     Grape juice                           Apple juice Coffee  or tea                        Jell-O                                      Popsicle                                                Coffee or tea                        Coffee or tea      Complete one Gatorade drink the morning of surgery at: 4:30 AM       the day of surgery.     The day of surgery:  Drink ONE (1) Pre-Surgery Clear G2 by am the morning of surgery. Drink in one sitting. Do not sip.  This drink was given to you during your hospital  pre-op appointment visit. Nothing else to drink after completing the  Pre-Surgery Clear Ensure or G2.          If you have questions, please contact your surgeon's office.     Oral Hygiene is also important to reduce your risk of infection.                                    Remember - BRUSH YOUR TEETH THE MORNING OF SURGERY WITH YOUR REGULAR TOOTHPASTE   Do NOT smoke after Midnight   Take these medicines the morning of surgery with A SIP OF WATER: fexofenadine(allegra),synthroid,omeprazole.  DO NOT TAKE ANY ORAL DIABETIC MEDICATIONS DAY OF YOUR SURGERY                              You may not have any metal on your body including hair pins, jewelry, and body piercing             Do not wear make-up, lotions, powders, perfumes/cologne, or deodorant  Do not wear nail polish including gel and S&S, artificial/acrylic nails, or any other type of covering on natural nails including finger and toenails. If you have artificial nails, gel coating, etc. that needs to be removed by a nail salon please have this removed prior to surgery or surgery may need to be canceled/ delayed if the surgeon/ anesthesia feels like they are unable to be safely monitored.   Do not shave  48 hours prior to surgery.    Do not bring valuables to the hospital. [facility] IS NOT  RESPONSIBLE   FOR VALUABLES.   Contacts, dentures or bridgework may not be worn into surgery.   Bring small overnight bag day of surgery.    Patients discharged the day of  surgery will not be allowed to drive home.   Special Instructions: Bring a copy of your healthcare power of attorney and living will documents         the day of surgery if you haven't scanned them in before.              Please read over the following fact sheets you were given: IF YOU HAVE QUESTIONS ABOUT YOUR PRE OP INSTRUCTIONS PLEASE CALL [phone]   [facility] - Preparing for Surgery Before surgery, you can play an important role.  Because skin is not sterile, your skin needs to be as free of germs as possible.  You can reduce the number of germs on your skin by washing with CHG (chlorahexidine gluconate) soap before surgery.  CHG is an antiseptic cleaner which kills germs and bonds with the skin to continue killing germs even after washing. Please DO NOT use if you have an allergy to CHG or antibacterial soaps.  If your skin becomes reddened/irritated stop using the CHG and inform your nurse when you arrive at Short Stay. Do not shave (including legs and underarms) for at least 48 hours prior to the first CHG shower.  You may shave your face/neck. Please follow these instructions carefully:  1.  Shower with CHG Soap the night before surgery and the  morning of Surgery.  2.  If you choose to wash your hair, wash your hair first as usual with your  normal  shampoo.  3.  After you shampoo, rinse your hair and body thoroughly to remove the  shampoo.                           4.  Use CHG as you would any other liquid soap.  You can apply chg directly  to the skin and wash                       Gently with a scrungie or clean washcloth.  5.  Apply the CHG Soap to your body ONLY FROM THE NECK DOWN.   Do not use on face/ open                           Wound or open sores. Avoid contact with eyes, ears mouth and genitals (private parts).                       Wash face,  Genitals (private parts) with your normal soap.             6.  Wash thoroughly, paying special attention to the area where  your surgery  will be performed.  7.  Thoroughly rinse your body with warm water from the neck down.  8.  DO NOT shower/wash with your normal soap after using and rinsing off  the CHG Soap.                9.  Pat yourself dry with a clean towel.            10.  Wear clean pajamas.            11.  Place clean sheets on  your bed the night of your first shower and do not  sleep with pets. Day of Surgery : Do not apply any lotions/deodorants the morning of surgery.  Please wear clean clothes to the hospital/surgery center.  FAILURE TO FOLLOW THESE INSTRUCTIONS MAY RESULT IN THE CANCELLATION OF YOUR SURGERY PATIENT SIGNATURE_________________________________  NURSE SIGNATURE__________________________________  ________________________________________________________________________ [facility] Preparing for Total Shoulder Arthroplasty    Before surgery, you can play an important role. Because skin is not sterile, your skin needs to be as free of germs as possible. You can reduce the number of germs on your skin by using the following products. Benzoyl Peroxide Gel Reduces the number of germs present on the skin Applied twice a day to shoulder area starting two days before surgery    ==================================================================  Please follow these instructions carefully:  BENZOYL PEROXIDE 5% GEL  Please do not use if you have an allergy to benzoyl peroxide.   If your skin becomes reddened/irritated stop using the benzoyl peroxide.  Starting two days before surgery, apply as follows: Apply benzoyl peroxide in the morning and at night. Apply after taking a shower. If you are not taking a shower clean entire shoulder front, back, and side along with the armpit with a clean wet washcloth.  Place a quarter-sized dollop on your shoulder and rub in thoroughly, making sure to cover the front, back, and side of your shoulder, along with the armpit.   2 days before ____ AM    ____ PM              1 day before ____ AM   ____ PM                         Do this twice a day for two days.  (Last application is the night before surgery, AFTER using the CHG soap as described below).  Do NOT apply benzoyl peroxide gel on the day of surgery.   Incentive Spirometer  An incentive spirometer is a tool that can help keep your lungs clear and active. This tool measures how well you are filling your lungs with each breath. Taking long deep breaths may help reverse or decrease the chance of developing breathing (pulmonary) problems (especially infection) following: A long period of time when you are unable to move or be active. BEFORE THE PROCEDURE  If the spirometer includes an indicator to show your best effort, your nurse or respiratory therapist will set it to a desired goal. If possible, sit up straight or lean slightly forward. Try not to slouch. Hold the incentive spirometer in an upright position. INSTRUCTIONS FOR USE  Sit on the edge of your bed if possible, or sit up as far as you can in bed or on a chair. Hold the incentive spirometer in an upright position. Breathe out normally. Place the mouthpiece in your mouth and seal your lips tightly around it. Breathe in slowly and as deeply as possible, raising the piston or the ball toward the top of the column. Hold your breath for 3-5 seconds or for as long as possible. Allow the piston or ball to fall to the bottom of the column. Remove the mouthpiece from your mouth and breathe out normally. Rest for a few seconds and repeat Steps 1 through 7 at least 10 times every 1-2 hours when you are awake. Take your time and take a few normal breaths between deep breaths. The spirometer may include an indicator to  show your best effort. Use the indicator as a goal to work toward during each repetition. After each set of 10 deep breaths, practice coughing to be sure your lungs are clear. If you have an incision (the cut made at the  time of surgery), support your incision when coughing by placing a pillow or rolled up towels firmly against it. Once you are able to get out of bed, walk around indoors and cough well. You may stop using the incentive spirometer when instructed by your caregiver.  RISKS AND COMPLICATIONS Take your time so you do not get dizzy or light-headed. If you are in pain, you may need to take or ask for pain medication before doing incentive spirometry. It is harder to take a deep breath if you are having pain. AFTER USE Rest and breathe slowly and easily. It can be helpful to keep track of a log of your progress. Your caregiver can provide you with a simple table to help with this. If you are using the spirometer at home, follow these instructions: [place] IF:  You are having difficultly using the spirometer. You have trouble using the spirometer as often as instructed. Your pain medication is not giving enough relief while using the spirometer. You develop fever of 100.5 F (38.1 C) or higher. SEEK IMMEDIATE MEDICAL CARE IF:  You cough up bloody sputum that had not been present before. You develop fever of 102 F (38.9 C) or greater. You develop worsening pain at or near the incision site. MAKE SURE YOU:  Understand these instructions. Will watch your condition. Will get help right away if you are not doing well or get worse. Document Released: 02/18/2007 Document Revised: 12/31/2011 Document Reviewed: 04/21/2007 [facility] Patient Information 2014 [place], [place].   ________________________________________________________________________

## 2021-06-16 ENCOUNTER — Encounter (HOSPITAL_COMMUNITY): Payer: Self-pay

## 2021-06-16 ENCOUNTER — Other Ambulatory Visit: Payer: Self-pay

## 2021-06-16 ENCOUNTER — Encounter (HOSPITAL_COMMUNITY)
Admission: RE | Admit: 2021-06-16 | Discharge: 2021-06-16 | Disposition: A | Discharge status: Home / Self Care | Payer: Medicare Other | Source: Ambulatory Visit | Attending: Orthopedic Surgery | Admitting: Orthopedic Surgery

## 2021-06-16 DIAGNOSIS — Z01818 Encounter for other preprocedural examination: Secondary | ICD-10-CM | POA: Insufficient documentation

## 2021-06-16 DIAGNOSIS — E119 Type 2 diabetes mellitus without complications: Secondary | ICD-10-CM | POA: Diagnosis not present

## 2021-06-16 HISTORY — DX: Anxiety disorder, unspecified: F41.9

## 2021-06-16 HISTORY — DX: Acute embolism and thrombosis of unspecified vein: I82.90

## 2021-06-16 HISTORY — DX: Malignant (primary) neoplasm, unspecified: C80.1

## 2021-06-16 LAB — GLUCOSE, CAPILLARY: Glucose-Capillary: 125 mg/dL — ABNORMAL HIGH (ref 70–99)

## 2021-06-16 LAB — CBC
HCT: 42.7 % (ref 36.0–46.0)
Hemoglobin: 13.7 g/dL (ref 12.0–15.0)
MCH: 28.1 pg (ref 26.0–34.0)
MCHC: 32.1 g/dL (ref 30.0–36.0)
MCV: 87.7 fL (ref 80.0–100.0)
Platelets: 258 10*3/uL (ref 150–400)
RBC: 4.87 MIL/uL (ref 3.87–5.11)
RDW: 13.7 % (ref 11.5–15.5)
WBC: 5 10*3/uL (ref 4.0–10.5)
nRBC: 0 % (ref 0.0–0.2)

## 2021-06-16 LAB — BASIC METABOLIC PANEL
Anion gap: 7 (ref 5–15)
BUN: 12 mg/dL (ref 8–23)
CO2: 29 mmol/L (ref 22–32)
Calcium: 9.3 mg/dL (ref 8.9–10.3)
Chloride: 102 mmol/L (ref 98–111)
Creatinine, Ser: 0.79 mg/dL (ref 0.44–1.00)
GFR, Estimated: >60 mL/min (ref >60)
Glucose, Bld: 106 mg/dL — ABNORMAL HIGH (ref 70–99)
Potassium: 3.7 mmol/L (ref 3.5–5.1)
Sodium: 138 mmol/L (ref 135–145)

## 2021-06-16 LAB — SURGICAL PCR SCREEN
MRSA, PCR: NEGATIVE
Staphylococcus aureus: NEGATIVE

## 2021-06-16 LAB — HEMOGLOBIN A1C
Hgb A1c MFr Bld: 6.7 % — ABNORMAL HIGH (ref 4.8–5.6)
Mean Plasma Glucose: 145.59 mg/dL

## 2021-06-16 NOTE — Progress Notes (Addendum)
COVID Vaccine Completed: Yes Date COVID Vaccine completed: 2021. X 3 COVID vaccine manufacturer:    [place]     PCP - DO: Sudie Grumbling Cardiologist - NO  Chest x-ray -  EKG -  Stress Test -  ECHO -  Cardiac Cath -  Pacemaker/ICD device last checked:  Sleep Study -  CPAP -   Fasting Blood Sugar - N/A Checks Blood Sugar ___0__ times a day  Blood Thinner Instructions: Aspirin Instructions: Last Dose:  Anesthesia review: Hx: HTN,DIA,Dysrhythmias  Patient denies shortness of breath, fever, cough and chest pain at PAT appointment   Patient verbalized understanding of instructions that were given to them at the PAT appointment. Patient was also instructed that they will need to review over the PAT instructions again at home before surgery.

## 2021-06-20 ENCOUNTER — Other Ambulatory Visit: Payer: Self-pay | Admitting: "Endocrinology

## 2021-06-20 ENCOUNTER — Telehealth: Payer: Self-pay

## 2021-06-20 DIAGNOSIS — E039 Hypothyroidism, unspecified: Secondary | ICD-10-CM

## 2021-06-20 LAB — TSH: TSH: 0.08 — AB (ref 0.41–5.90)

## 2021-06-20 MED ORDER — LEVOTHYROXINE SODIUM 88 MCG PO TABS: ORAL_TABLET | ORAL | 0 refills | Status: DC

## 2021-06-20 NOTE — Telephone Encounter (Signed)
Received lab results from [facility] for the following lab results:  Free T4 is 1.13 Reference (0.76-1.46) NG/DL TSH is 0.08 Reference (0.35-3.74) uIU/mL  I have abstracted the TSH and not able to abstract the Free T4.

## 2021-06-27 ENCOUNTER — Ambulatory Visit: Payer: Medicare Other | Admitting: "Endocrinology

## 2021-06-27 ENCOUNTER — Encounter: Payer: Self-pay | Admitting: "Endocrinology

## 2021-06-27 ENCOUNTER — Other Ambulatory Visit: Payer: Self-pay

## 2021-06-27 VITALS — BP 116/78 | HR 92 | Ht 65.0 in | Wt 278.0 lb

## 2021-06-27 DIAGNOSIS — E039 Hypothyroidism, unspecified: Secondary | ICD-10-CM

## 2021-06-27 DIAGNOSIS — E119 Type 2 diabetes mellitus without complications: Secondary | ICD-10-CM

## 2021-06-27 MED ORDER — LEVOTHYROXINE SODIUM 88 MCG PO TABS: ORAL_TABLET | ORAL | 1 refills | Status: DC

## 2021-06-27 NOTE — Progress Notes (Signed)
06/27/2021, 1:50 PM                           Endocrinology follow-up note    [name] is a 70 y.o.-year-old female patient being seen in follow-up for hypothyroidism, prediabetes.  EO:[id number], Provider, MD.   Past Medical History:  Diagnosis Date   Anemia    Anxiety    Arthritis    Blood clot in vein    Cancer ([place])    cervical   Constipation    Diabetes mellitus without complication (HCC)    Type II- not on medications   Dysrhythmia    "palpations with full dose of hydrocodone"- takes 1/2 of Hydrocodone- Acetaminophen and does not have palpations   GERD (gastroesophageal reflux disease)    Headache    occasional   Hemorrhoids    History of hiatal hernia    Hypertension    Hypothyroidism    Past Surgical History:  Procedure Laterality Date   ABDOMINAL HYSTERECTOMY     partial -right ovary remains   BACK SURGERY     cataract     CERVIX REMOVAL     COLONOSCOPY     FOOT SURGERY Left    Callus- reaset bone   NASAL SINUS SURGERY     TONSILLECTOMY     Social History   Socioeconomic History   Marital status: Single    Spouse name: Not on file   Number of children: Not on file   Years of education: Not on file   Highest education level: Not on file  Occupational History   Not on file  Tobacco Use   Smoking status: Former    Packs/day: 0.25    Years: 15.00    Pack years: 3.75    Types: Cigarettes   Smokeless tobacco: Former   Tobacco comments:    quit approx age 1 e 9  Vaping Use   Vaping Use: Never used  Substance and Sexual Activity   Alcohol use: Yes    Alcohol/week: 3.0 standard drinks    Types: 3 Glasses of wine per week    Comment: occas.   Drug use: No   Sexual activity: Not on file  Other Topics Concern   Not on file  Social History Narrative   Not on file   Social Determinants of Health   Financial Resource Strain:  Not on file  Food Insecurity: Not on file  Transportation Needs: Not on file  Physical Activity: Not on file  Stress: Not on file  Social Connections: Not on file   Outpatient Encounter Medications as of 06/27/2021  Medication Sig   Cholecalciferol (VITAMIN D3 PO) Take 1 tablet by mouth daily.   CRESTOR 20 MG tablet Take 20 mg by mouth at bedtime.   fexofenadine (ALLEGRA) 180 MG tablet  Take 180 mg by mouth daily.   gabapentin (NEURONTIN) 400 MG capsule Take 400 mg by mouth 3 (three) times daily as needed (pain).   hydrochlorothiazide (HYDRODIURIL) 25 MG tablet Take 25 mg by mouth daily.   levocetirizine (XYZAL) 5 MG tablet Take 5 mg by mouth every evening.   levothyroxine (SYNTHROID) 88 MCG tablet TAKE 1 TABLET BY MOUTH EVERY DAY BEFORE breakfast   losartan (COZAAR) 100 MG tablet Take 100 mg by mouth daily.   methocarbamol (ROBAXIN) 750 MG tablet Take 1 tablet (750 mg total) by mouth 4 (four) times daily. (Patient taking differently: Take 750 mg by mouth 4 (four) times daily as needed for muscle spasms.)   Omega-3 Fatty Acids (OMEGA 3 PO) Take 5 capsules by mouth daily.   omeprazole (PRILOSEC) 40 MG capsule Take 40 mg by mouth daily.   POTASSIUM GLUCONATE PO Take 1 tablet by mouth in the morning and at bedtime.   spironolactone (ALDACTONE) 25 MG tablet Take 25 mg by mouth daily.   TURMERIC CURCUMIN PO Take 1 capsule by mouth daily.   [DISCONTINUED] gabapentin (NEURONTIN) 800 MG tablet TAKE 1 TABLET BY MOUTH THREE TIMES DAILY (Patient not taking: Reported on 06/27/2021)   [DISCONTINUED] levothyroxine (SYNTHROID) 88 MCG tablet TAKE 1 TABLET BY MOUTH EVERY DAY BEFORE breakfast   [DISCONTINUED] pregabalin (LYRICA) 100 MG capsule Take 100 mg by mouth 2 (two) times daily. (Patient not taking: No sig reported)   No facility-administered encounter medications on file as of 06/27/2021.   ALLERGIES: Allergies  Allergen Reactions   Mango Flavor Hives and Swelling    Swelling of throat   Hydrocodone  Palpitations    Pt states this was only with high dose, tolerated and had good relief with low dose   Penicillins Rash    Has patient had a PCN reaction causing immediate rash, facial/tongue/throat swelling, SOB or lightheadedness with hypotension: Yes Has patient had a PCN reaction causing severe rash involving mucus membranes or skin necrosis: No Has patient had a PCN reaction that required hospitalization No Has patient had a PCN reaction occurring within the last 10 years: No If all of the above answers are "NO", then may proceed with Cephalosporin use.    VACCINATION STATUS: Immunization History  Administered Date(s) Administered   Pneumococcal Polysaccharide-23 03/28/2016     HPI    Sheri Baker  is a patient with the above medical history. she was diagnosed  with hypothyroidism at approximate age of 76 years with lab work after suggestive clinical symptoms.  Her previsit labs were suggestive of over replacement.  Since her last visit, the dose of her levothyroxine was adjusted to 88 mcg daily before breakfast.  She has no new complaints today.    -She has minimally fluctuating body weight.   She denies palpitations, tremors, nor dysphagia.  She is scheduled for shoulder surgery in the next few days.   she denies family history of  thyroid disorders.  No family history of thyroid cancer.  No history of  radiation therapy to head or neck. No recent use of iodine supplements. -She has history of prediabetes with A1c of 6.3%, presently 6.2 %.  She is not on intervention.   ROS: Limited as above.   Physical Exam: BP 116/78   Pulse 92   Ht '5\' 5"'$  (1.651 m)   Wt 278 lb (126.1 kg)   BMI 46.26 kg/m  Wt Readings from Last 3 Encounters:  06/27/21 278 lb (126.1 kg)  06/16/21 277 lb (125.6  kg)  01/09/21 279 lb 12.8 oz (126.9 kg)     Physical Exam- Limited  Constitutional:  Body mass index is 46.26 kg/m. , not in acute distress, normal state of mind   CMP ( most  recent) CMP     Component Value Date/Time   NA 138 06/16/2021 1109   K 3.7 06/16/2021 1109   CL 102 06/16/2021 1109   CO2 29 06/16/2021 1109   GLUCOSE 106 (H) 06/16/2021 1109   BUN 12 06/16/2021 1109   BUN 10 07/22/2018 0000   CREATININE 0.79 06/16/2021 1109   CALCIUM 9.3 06/16/2021 1109   PROT 5.9 (L) 03/31/2016 0421   ALBUMIN 2.4 (L) 03/31/2016 0421   AST 18 03/31/2016 0421   ALT 18 03/31/2016 0421   ALKPHOS 53 03/31/2016 0421   BILITOT 0.6 03/31/2016 0421   GFRNONAA >60 06/16/2021 1109   GFRAA >60 04/13/2016 0631     Diabetic Labs (most recent): Lab Results  Component Value Date   HGBA1C 6.7 (H) 06/16/2021   HGBA1C 6.2 01/09/2021   HGBA1C 6.0 07/22/2018     Lipid Panel ( most recent) Lipid Panel     Component Value Date/Time   CHOL 148 07/22/2018 0000   TRIG 92 07/22/2018 0000   HDL 47 07/22/2018 0000   LDLCALC 83 07/22/2018 0000       Lab Results  Component Value Date   TSH 0.08 (A) 06/20/2021   TSH 0.20 (A) 03/17/2021   TSH 0.21 (A) 12/30/2020   TSH 0.19 (A) 07/29/2020   TSH 0.23 (A) 07/24/2019   TSH 0.04 (A) 03/20/2019   TSH 0.30 (A) 07/22/2018       ASSESSMENT: 1.  Hypothyroidism 2.  Prediabetes 3.  Multiple thyroid nodule  PLAN:   -Her previsit thyroid function tests are consistent with slight over replacement.  She is advised to continue on levothyroxine 88 mcg p.o. daily before breakfast.     - We discussed about the correct intake of her thyroid hormone, on empty stomach at fasting, with water, separated by at least 30 minutes from breakfast and other medications,  and separated by more than 4 hours from calcium, iron, multivitamins, acid reflux medications (PPIs). -Patient is made aware of the fact that thyroid hormone replacement is needed for life, dose to be adjusted by periodic monitoring of thyroid function tests.   -Given palpable thyroid on physical exam, she underwent thyroid ultrasound showing subcentimeter cystic nodules  bilaterally.  Her thyroid is not generally enlarged, consistent with heterogeneous echotexture.  Her A1c is now 6.7% consistent with type 2 diabetes.  she is encouraged to continue on her lifestyle change.  She is not on any medication intervention.  - she acknowledges that there is a room for improvement in her food and drink choices. - Suggestion is made for her to avoid simple carbohydrates  from her diet including Cakes, Sweet Desserts, Ice Cream, Soda (diet and regular), Sweet Tea, Candies, Chips, Cookies, Store Bought Juices, Alcohol in Excess of  1-2 drinks a day, Artificial Sweeteners,  Coffee Creamer, and "Sugar-free" Products, Lemonade. This will help patient to have more stable blood glucose profile and potentially avoid unintended weight gain.  She is advised to continue close follow-up with her PMD.  Return in about 3 months (around 09/26/2021) for F/U with Pre-visit Labs.   I spent 21 minutes in the care of the patient today including review of labs from Thyroid Function, CMP, and other relevant labs ; imaging/biopsy records (current and previous including  abstractions from other facilities); face-to-face time discussing  her lab results and symptoms, medications doses, her options of short and long term treatment based on the latest standards of care / guidelines;   and documenting the encounter.  Myleen Blume  participated in the discussions, expressed understanding, and voiced agreement with the above plans.  All questions were answered to her satisfaction. she is encouraged to contact clinic should she have any questions or concerns prior to her return visit.   Glade Lloyd, MD [facility] [address] [place],  [address] Phone: [phone]  Fax: [phone]   06/27/2021, 1:50 PM  This note was partially dictated with voice recognition software. Similar sounding words can be transcribed inadequately or may not  be  corrected upon review.

## 2021-06-28 ENCOUNTER — Encounter (HOSPITAL_COMMUNITY): Payer: Self-pay | Admitting: Orthopedic Surgery

## 2021-06-28 NOTE — Anesthesia Preprocedure Evaluation (Addendum)
Anesthesia Evaluation  Patient identified by MRN, date of birth, ID band Patient awake    Reviewed: Allergy & Precautions, NPO status , Patient's Chart, lab work & pertinent test results  Airway Mallampati: III  TM Distance: >3 FB Neck ROM: Full    Dental no notable dental hx. (+) Teeth Intact, Dental Advisory Given   Pulmonary former smoker, PE Hx/o PTE   Pulmonary exam normal breath sounds clear to auscultation       Cardiovascular hypertension, Pt. on medications Normal cardiovascular exam+ dysrhythmias  Rhythm:Regular Rate:Normal  EKG 06/12/21 NSR, septal infarct   Neuro/Psych  Headaches, Anxiety Peripheral neuropathy  Neuromuscular disease    GI/Hepatic Neg liver ROS, hiatal hernia, GERD  Medicated,  Endo/Other  diabetes, Well Controlled, Type 2Hypothyroidism Morbid obesity  Renal/GU negative Renal ROS  negative genitourinary   Musculoskeletal  (+) Arthritis , Osteoarthritis,  Left shoulder rotator cuff tear Hx/o spinal stenosis S/P back surgery Chronic LBP   Abdominal (+) + obese,   Peds  Hematology  (+) anemia ,   Anesthesia Other Findings   Reproductive/Obstetrics                            Anesthesia Physical Anesthesia Plan  ASA: 3  Anesthesia Plan: General   Post-op Pain Management:  Regional for Post-op pain   Induction:   PONV Risk Score and Plan: 4 or greater and Treatment may vary due to age or medical condition, Ondansetron and Dexamethasone  Airway Management Planned: Oral ETT  Additional Equipment:   Intra-op Plan:   Post-operative Plan: Extubation in OR  Informed Consent: I have reviewed the patients History and Physical, chart, labs and discussed the procedure including the risks, benefits and alternatives for the proposed anesthesia with the patient or authorized representative who has indicated his/her understanding and acceptance.     Dental advisory  given  Plan Discussed with: Anesthesiologist and CRNA  Anesthesia Plan Comments:        Anesthesia Quick Evaluation

## 2021-06-29 ENCOUNTER — Encounter (HOSPITAL_COMMUNITY): Payer: Self-pay | Admitting: Orthopedic Surgery

## 2021-06-29 ENCOUNTER — Encounter (HOSPITAL_COMMUNITY)
Admission: RE | Disposition: A | Discharge status: Home / Self Care | Payer: Self-pay | Source: Home / Self Care | Attending: Orthopedic Surgery

## 2021-06-29 ENCOUNTER — Ambulatory Visit (HOSPITAL_COMMUNITY): Payer: Medicare Other | Admitting: Anesthesiology

## 2021-06-29 ENCOUNTER — Ambulatory Visit (HOSPITAL_COMMUNITY): Payer: Medicare Other | Admitting: Physician Assistant

## 2021-06-29 ENCOUNTER — Ambulatory Visit (HOSPITAL_COMMUNITY)
Admission: RE | Admit: 2021-06-29 | Discharge: 2021-06-29 | Disposition: A | Discharge status: Home / Self Care | Payer: Medicare Other | Attending: Orthopedic Surgery | Admitting: Orthopedic Surgery

## 2021-06-29 DIAGNOSIS — Z79899 Other long term (current) drug therapy: Secondary | ICD-10-CM | POA: Insufficient documentation

## 2021-06-29 DIAGNOSIS — Z79890 Hormone replacement therapy (postmenopausal): Secondary | ICD-10-CM | POA: Diagnosis not present

## 2021-06-29 DIAGNOSIS — M75102 Unspecified rotator cuff tear or rupture of left shoulder, not specified as traumatic: Secondary | ICD-10-CM | POA: Insufficient documentation

## 2021-06-29 DIAGNOSIS — Z87891 Personal history of nicotine dependence: Secondary | ICD-10-CM | POA: Diagnosis not present

## 2021-06-29 DIAGNOSIS — M129 Arthropathy, unspecified: Secondary | ICD-10-CM | POA: Diagnosis not present

## 2021-06-29 HISTORY — PX: REVERSE SHOULDER ARTHROPLASTY: SHX5054

## 2021-06-29 LAB — GLUCOSE, CAPILLARY
Glucose-Capillary: 107 mg/dL — ABNORMAL HIGH (ref 70–99)
Glucose-Capillary: 131 mg/dL — ABNORMAL HIGH (ref 70–99)

## 2021-06-29 SURGERY — ARTHROPLASTY, SHOULDER, TOTAL, REVERSE
Anesthesia: General | Site: Shoulder | Laterality: Left

## 2021-06-29 MED ORDER — FENTANYL CITRATE PF 50 MCG/ML IJ SOSY
PREFILLED_SYRINGE | INTRAMUSCULAR | Status: AC
  Filled 2021-06-29: qty 1

## 2021-06-29 MED ORDER — ONDANSETRON HCL 4 MG/2ML IJ SOLN
INTRAMUSCULAR | Status: AC
  Filled 2021-06-29: qty 2

## 2021-06-29 MED ORDER — LIDOCAINE 2% (20 MG/ML) 5 ML SYRINGE
INTRAMUSCULAR | Status: DC | PRN
  Administered 2021-06-29: 60 mg via INTRAVENOUS

## 2021-06-29 MED ORDER — LIDOCAINE 2% (20 MG/ML) 5 ML SYRINGE
INTRAMUSCULAR | Status: AC
  Filled 2021-06-29: qty 5

## 2021-06-29 MED ORDER — LACTATED RINGERS IV BOLUS
250.0000 mL | Freq: Once | INTRAVENOUS | Status: AC
  Administered 2021-06-29: 250 mL via INTRAVENOUS

## 2021-06-29 MED ORDER — PHENYLEPHRINE 40 MCG/ML (10ML) SYRINGE FOR IV PUSH (FOR BLOOD PRESSURE SUPPORT)
PREFILLED_SYRINGE | INTRAVENOUS | Status: DC | PRN
  Administered 2021-06-29 (×2): 80 ug via INTRAVENOUS

## 2021-06-29 MED ORDER — VANCOMYCIN HCL 1000 MG IV SOLR
INTRAVENOUS | Status: DC | PRN
  Administered 2021-06-29: 1000 mg

## 2021-06-29 MED ORDER — BUPIVACAINE LIPOSOME 1.3 % IJ SUSP
INTRAMUSCULAR | Status: DC | PRN
  Administered 2021-06-29: 10 mL via PERINEURAL

## 2021-06-29 MED ORDER — HYDROMORPHONE HCL 2 MG PO TABS: 2.0000 mg | ORAL_TABLET | ORAL | 0 refills | Status: DC | PRN

## 2021-06-29 MED ORDER — HYDROMORPHONE HCL 1 MG/ML IJ SOLN
INTRAMUSCULAR | Status: DC | PRN
  Administered 2021-06-29: .5 mg via INTRAVENOUS

## 2021-06-29 MED ORDER — FENTANYL CITRATE (PF) 100 MCG/2ML IJ SOLN
INTRAMUSCULAR | Status: DC | PRN
  Administered 2021-06-29 (×2): 50 ug via INTRAVENOUS

## 2021-06-29 MED ORDER — LACTATED RINGERS IV SOLN: INTRAVENOUS | Status: DC

## 2021-06-29 MED ORDER — CHLORHEXIDINE GLUCONATE 0.12 % MT SOLN
15.0000 mL | Freq: Once | OROMUCOSAL | Status: AC
  Administered 2021-06-29: 15 mL via OROMUCOSAL

## 2021-06-29 MED ORDER — ONDANSETRON HCL 4 MG/2ML IJ SOLN
INTRAMUSCULAR | Status: DC | PRN
  Administered 2021-06-29: 4 mg via INTRAVENOUS

## 2021-06-29 MED ORDER — ROCURONIUM BROMIDE 10 MG/ML (PF) SYRINGE
PREFILLED_SYRINGE | INTRAVENOUS | Status: DC | PRN
  Administered 2021-06-29: 70 mg via INTRAVENOUS

## 2021-06-29 MED ORDER — SUGAMMADEX SODIUM 200 MG/2ML IV SOLN
INTRAVENOUS | Status: DC | PRN
  Administered 2021-06-29 (×2): 200 mg via INTRAVENOUS

## 2021-06-29 MED ORDER — LACTATED RINGERS IV BOLUS
500.0000 mL | Freq: Once | INTRAVENOUS | Status: AC
  Administered 2021-06-29: 500 mL via INTRAVENOUS

## 2021-06-29 MED ORDER — NAPROXEN 500 MG PO TABS: 500.0000 mg | ORAL_TABLET | Freq: Two times a day (BID) | ORAL | 1 refills | Status: DC

## 2021-06-29 MED ORDER — OXYCODONE HCL 5 MG PO TABS
ORAL_TABLET | ORAL | Status: AC
  Filled 2021-06-29: qty 1

## 2021-06-29 MED ORDER — HYDROMORPHONE HCL 2 MG/ML IJ SOLN
INTRAMUSCULAR | Status: AC
  Filled 2021-06-29: qty 1

## 2021-06-29 MED ORDER — ONDANSETRON HCL 4 MG PO TABS: 4.0000 mg | ORAL_TABLET | Freq: Three times a day (TID) | ORAL | 0 refills | Status: AC | PRN

## 2021-06-29 MED ORDER — PROPOFOL 10 MG/ML IV BOLUS
INTRAVENOUS | Status: AC
  Filled 2021-06-29: qty 20

## 2021-06-29 MED ORDER — OXYCODONE HCL 5 MG PO TABS
5.0000 mg | ORAL_TABLET | Freq: Once | ORAL | Status: AC | PRN
  Administered 2021-06-29: 5 mg via ORAL

## 2021-06-29 MED ORDER — CEFAZOLIN IN SODIUM CHLORIDE 3-0.9 GM/100ML-% IV SOLN
3.0000 g | INTRAVENOUS | Status: AC
  Administered 2021-06-29: 3 g via INTRAVENOUS
  Filled 2021-06-29: qty 100

## 2021-06-29 MED ORDER — OXYCODONE HCL 5 MG/5ML PO SOLN: 5.0000 mg | Freq: Once | ORAL | Status: AC | PRN

## 2021-06-29 MED ORDER — ONDANSETRON HCL 4 MG/2ML IJ SOLN: 4.0000 mg | Freq: Once | INTRAMUSCULAR | Status: DC | PRN

## 2021-06-29 MED ORDER — DEXAMETHASONE SODIUM PHOSPHATE 10 MG/ML IJ SOLN
INTRAMUSCULAR | Status: DC | PRN
  Administered 2021-06-29: 10 mg via INTRAVENOUS

## 2021-06-29 MED ORDER — MIDAZOLAM HCL 5 MG/5ML IJ SOLN
INTRAMUSCULAR | Status: DC | PRN
  Administered 2021-06-29: 2 mg via INTRAVENOUS

## 2021-06-29 MED ORDER — DEXAMETHASONE SODIUM PHOSPHATE 10 MG/ML IJ SOLN
INTRAMUSCULAR | Status: AC
  Filled 2021-06-29: qty 1

## 2021-06-29 MED ORDER — PHENYLEPHRINE HCL (PRESSORS) 10 MG/ML IV SOLN
INTRAVENOUS | Status: AC
  Filled 2021-06-29: qty 1

## 2021-06-29 MED ORDER — TRANEXAMIC ACID 1000 MG/10ML IV SOLN: 1000.0000 mg | INTRAVENOUS | Status: DC

## 2021-06-29 MED ORDER — FENTANYL CITRATE PF 50 MCG/ML IJ SOSY
PREFILLED_SYRINGE | INTRAMUSCULAR | Status: AC
  Administered 2021-06-29: 25 ug via INTRAVENOUS
  Filled 2021-06-29: qty 1

## 2021-06-29 MED ORDER — PROPOFOL 10 MG/ML IV BOLUS
INTRAVENOUS | Status: DC | PRN
  Administered 2021-06-29: 200 mg via INTRAVENOUS

## 2021-06-29 MED ORDER — FENTANYL CITRATE PF 50 MCG/ML IJ SOSY
25.0000 ug | PREFILLED_SYRINGE | INTRAMUSCULAR | Status: DC | PRN
  Administered 2021-06-29: 25 ug via INTRAVENOUS

## 2021-06-29 MED ORDER — METHOCARBAMOL 750 MG PO TABS: 750.0000 mg | ORAL_TABLET | Freq: Four times a day (QID) | ORAL | 0 refills | Status: AC | PRN

## 2021-06-29 MED ORDER — BUPIVACAINE-EPINEPHRINE (PF) 0.5% -1:200000 IJ SOLN
INTRAMUSCULAR | Status: DC | PRN
  Administered 2021-06-29: 20 mL via PERINEURAL

## 2021-06-29 MED ORDER — PHENYLEPHRINE HCL-NACL 20-0.9 MG/250ML-% IV SOLN
INTRAVENOUS | Status: DC | PRN
  Administered 2021-06-29: 50 ug/min via INTRAVENOUS

## 2021-06-29 MED ORDER — MIDAZOLAM HCL 2 MG/2ML IJ SOLN
INTRAMUSCULAR | Status: AC
  Filled 2021-06-29: qty 2

## 2021-06-29 MED ORDER — ORAL CARE MOUTH RINSE: 15.0000 mL | Freq: Once | OROMUCOSAL | Status: AC

## 2021-06-29 MED ORDER — 0.9 % SODIUM CHLORIDE (POUR BTL) OPTIME
TOPICAL | Status: DC | PRN
  Administered 2021-06-29: 650 mL

## 2021-06-29 MED ORDER — VANCOMYCIN HCL 1000 MG IV SOLR
INTRAVENOUS | Status: AC
  Filled 2021-06-29: qty 20

## 2021-06-29 MED ORDER — FENTANYL CITRATE (PF) 100 MCG/2ML IJ SOLN
INTRAMUSCULAR | Status: AC
  Filled 2021-06-29: qty 2

## 2021-06-29 MED ORDER — TRANEXAMIC ACID-NACL 1000-0.7 MG/100ML-% IV SOLN
1000.0000 mg | INTRAVENOUS | Status: AC
  Administered 2021-06-29: 1000 mg via INTRAVENOUS
  Filled 2021-06-29: qty 100

## 2021-06-29 SURGICAL SUPPLY — 63 items
BAG COUNTER SPONGE SURGICOUNT (BAG) ×2 IMPLANT
BAG ZIPLOCK 12X15 (MISCELLANEOUS) ×2 IMPLANT
BLADE SAW SGTL 83.5X18.5 (BLADE) ×2 IMPLANT
COOLER ICEMAN CLASSIC (MISCELLANEOUS) ×2 IMPLANT
COVER BACK TABLE 60X90IN (DRAPES) ×2 IMPLANT
COVER SURGICAL LIGHT HANDLE (MISCELLANEOUS) ×2 IMPLANT
CUP SUT UNIV REVERS 36 NEUTRAL (Cup) ×2 IMPLANT
DERMABOND ADVANCED (GAUZE/BANDAGES/DRESSINGS) ×1
DERMABOND ADVANCED .7 DNX12 (GAUZE/BANDAGES/DRESSINGS) ×1 IMPLANT
DRAPE INCISE IOBAN 66X45 STRL (DRAPES) IMPLANT
DRAPE ORTHO SPLIT 77X108 STRL (DRAPES) ×2
DRAPE SHEET LG 3/4 BI-LAMINATE (DRAPES) ×2 IMPLANT
DRAPE SURG 17X11 SM STRL (DRAPES) ×2 IMPLANT
DRAPE SURG ORHT 6 SPLT 77X108 (DRAPES) ×2 IMPLANT
DRAPE TOP 10253 STERILE (DRAPES) ×2 IMPLANT
DRAPE U-SHAPE 47X51 STRL (DRAPES) ×2 IMPLANT
DRESSING AQUACEL AG SP 3.5X6 (GAUZE/BANDAGES/DRESSINGS) IMPLANT
DRSG AQUACEL AG ADV 3.5X10 (GAUZE/BANDAGES/DRESSINGS) ×2 IMPLANT
DRSG AQUACEL AG SP 3.5X6 (GAUZE/BANDAGES/DRESSINGS)
DURAPREP 26ML APPLICATOR (WOUND CARE) ×2 IMPLANT
ELECT BLADE TIP CTD 4 INCH (ELECTRODE) ×2 IMPLANT
ELECT REM PT RETURN 15FT ADLT (MISCELLANEOUS) ×2 IMPLANT
FACESHIELD WRAPAROUND (MASK) ×8 IMPLANT
GLENOID UNI REV MOD 24 +2 LAT (Joint) ×2 IMPLANT
GLENOSPHERE 36 +4 LAT/24 (Joint) ×2 IMPLANT
GLOVE SRG 8 PF TXTR STRL LF DI (GLOVE) ×1 IMPLANT
GLOVE SURG ENC MOIS LTX SZ7 (GLOVE) ×2 IMPLANT
GLOVE SURG ENC MOIS LTX SZ7.5 (GLOVE) ×2 IMPLANT
GLOVE SURG UNDER POLY LF SZ7 (GLOVE) ×2 IMPLANT
GLOVE SURG UNDER POLY LF SZ8 (GLOVE) ×1
GOWN STRL REUS W/TWL LRG LVL3 (GOWN DISPOSABLE) ×8 IMPLANT
KIT BASIN OR (CUSTOM PROCEDURE TRAY) ×2 IMPLANT
KIT TURNOVER KIT A (KITS) ×2 IMPLANT
LINER HUMERAL 36 +3MM SM (Shoulder) ×2 IMPLANT
MANIFOLD NEPTUNE II (INSTRUMENTS) ×2 IMPLANT
NEEDLE TAPERED W/ NITINOL LOOP (MISCELLANEOUS) ×2 IMPLANT
NS IRRIG 1000ML POUR BTL (IV SOLUTION) ×2 IMPLANT
PACK SHOULDER (CUSTOM PROCEDURE TRAY) ×2 IMPLANT
PAD ARMBOARD 7.5X6 YLW CONV (MISCELLANEOUS) ×2 IMPLANT
PAD COLD SHLDR WRAP-ON (PAD) ×2 IMPLANT
PIN NITINOL TARGETER 2.8 (PIN) IMPLANT
PIN SET MODULAR GLENOID SYSTEM (PIN) ×2 IMPLANT
RESTRAINT HEAD UNIVERSAL NS (MISCELLANEOUS) ×2 IMPLANT
SCREW CENTRAL MODULAR 25 (Screw) ×2 IMPLANT
SCREW PERI LOCK 5.5X16 (Screw) ×4 IMPLANT
SCREW PERI LOCK 5.5X32 (Screw) ×4 IMPLANT
SLING ARM FOAM STRAP LRG (SOFTGOODS) ×2 IMPLANT
SLING ARM FOAM STRAP MED (SOFTGOODS) IMPLANT
SPONGE T-LAP 18X18 ~~LOC~~+RFID (SPONGE) IMPLANT
STEM HUMERAL UNI REVERS SZ6 (Stem) ×2 IMPLANT
SUCTION FRAZIER HANDLE 12FR (TUBING) ×1
SUCTION TUBE FRAZIER 12FR DISP (TUBING) ×1 IMPLANT
SUT FIBERWIRE #2 38 T-5 BLUE (SUTURE)
SUT MNCRL AB 3-0 PS2 18 (SUTURE) ×2 IMPLANT
SUT MON AB 2-0 CT1 36 (SUTURE) ×2 IMPLANT
SUT VIC AB 1 CT1 36 (SUTURE) ×2 IMPLANT
SUTURE FIBERWR #2 38 T-5 BLUE (SUTURE) IMPLANT
SUTURE TAPE 1.3 40 TPR END (SUTURE) ×2 IMPLANT
SUTURETAPE 1.3 40 TPR END (SUTURE) ×4
TOWEL OR 17X26 10 PK STRL BLUE (TOWEL DISPOSABLE) ×2 IMPLANT
TOWEL OR NON WOVEN STRL DISP B (DISPOSABLE) ×2 IMPLANT
WATER STERILE IRR 1000ML POUR (IV SOLUTION) ×4 IMPLANT
YANKAUER SUCT BULB TIP 10FT TU (MISCELLANEOUS) ×2 IMPLANT

## 2021-06-29 NOTE — Op Note (Signed)
06/29/2021  9:06 AM  PATIENT:   Sheri Baker  70 y.o. female  PRE-OPERATIVE DIAGNOSIS:  left shoulder rotator cuff tear arthropathy  POST-OPERATIVE DIAGNOSIS: Same  PROCEDURE: Left shoulder reverse arthroplasty utilizing a press-fit size 6 Arthrex stem with a neutral metaphysis, +3 polyethylene insert, 36/+4 glenosphere on a small/+2 baseplate  SURGEON:  Zyon Grout, Metta Clines M.D.  ASSISTANTS: Jenetta Loges, PA-C  ANESTHESIA:   General endotracheal and interscalene block with Exparel  EBL: 100 cc  SPECIMEN: None  Drains: None   PATIENT DISPOSITION:  PACU - hemodynamically stable.    PLAN OF CARE: Discharge to home after PACU  Brief history:  Patient is a 70 year old female with chronic and progressively increasing left shoulder pain related to severe rotator cuff tear arthropathy.  Due to her increasing functional imitations and failure to respond to prolonged attempts at conservative management, she is brought to the operating room at this time for planned left shoulder reverse arthroplasty.  Preoperatively, I counseled the patient regarding treatment options and risks versus benefits thereof.  Possible surgical complications were all reviewed including potential for bleeding, infection, neurovascular injury, persistent pain, loss of motion, anesthetic complication, failure of the implant, and possible need for additional surgery. They understand and accept and agrees with our planned procedure.   Procedure in detail:  After undergoing routine preop evaluation the patient received prophylactic antibiotics and an interscalene block with Exparel was established in the holding area by the anesthesia department.  Patient was subsequently placed supine on the operating table and underwent the smooth induction of a general endotracheal anesthesia.  Placed in the beachchair position and appropriately padded and protected.  Left shoulder girdle region was sterilely prepped and draped in  standard fashion.  Timeout was called.  A deltopectoral approach left shoulder was made through an approximate 12 cm incision.  Skin flaps were elevated dissection carried deeply and the deltopectoral interval was developed from proximal to distal with the vein taken laterally.  Upper centimeter and a half the pectoralis major tendon was then divided for exposure and the conjoined tendon was mobilized and retracted medially.  The long head biceps tendon was then unroofed and tenodesed at the upper border the pectoralis major tendon with the proximal segment excised.  The remnant of the rotator cuff was then split from the apex of the bicipital groove to the base of the coracoid and the subscapularis was then separated from the lesser tuberosity using electrocautery and the free margin was then tagged with a pair of suture tape sutures.  Capsular attachments were then divided from the anterior and inferior margins of the humeral neck and the humeral head was then delivered through the wound.  An extra medullary guide was then used to outline a proposed humeral head resection which was performed with an oscillating saw at approximate 20 degrees retroversion.  A metal cap was then placed over the cut proximal humeral surface.  The glenoid was then exposed with the appropriate retractors and a circumferential labral resection was then completed.  Guidepin was then directed into the center of the glenoid with an approximately 5 degree inferior tilt and the glenoid was then reamed with the central followed by the peripheral reamers and all bone and cartilage debris was meticulously removed and irrigated free.  Glenoid preparation completed with the central drill and tap at a 25 mm length.  Our baseplate was then assembled with vancomycin powder applied to the threads of the lag screw and it was then inserted with  excellent purchase and fixation.  All of the peripheral locking screws were then placed using standard  technique.  Excellent fixation was achieved.  A 36/+4 glenosphere was then impacted on the baseplate and the central locking screw was placed.  We then returned our attention to the proximal humerus where the canal was opened hand reaming and we broached up to a size 6 at approximate 20 degrees retroversion.  A neutral metaphyseal reamer was then utilized and the metaphysis was prepared.  I trial implant was then placed and trial reduction showed good motion good stability good soft tissue balance.  At this point the trial was removed the final implant was then assembled.  The canal was irrigated cleaned and dried and vancomycin powder was then spread liberally into the humeral canal.  Our final implant was then seated with excellent fit and fixation.  Trial reductions at this point showed mobility stability and soft tissue balance with a +3 poly-.  Trial was removed the implant was cleaned and dried the final +3 polywas then impacted.  Final reduction was then performed again with motion stability and soft tissue balance much to our satisfaction.  The joint was then copiously irrigated.  Hemostasis was obtained.  We confirmed good elasticity of the subscapularis and was then repaired back to the eyelets on the collar the implant.  The balance of the vancomycin powder was then spread liberally throughout the deep soft tissue layer.  The deltopectoral interval was reapproximated with a series of figure-of-eight number Vicryl sutures.  2-0 Monocryl used to the subcu layer and intracuticular 3-0 Monocryl for the skin followed by Dermabond and Aquacel dressing.  Left arm was placed into a sling.  Patient was awakened, extubated, and taken to the recovery room in stable condition.  Jenetta Loges, PA-C was utilized as an Environmental consultant throughout this case, essential for help with positioning the patient, positioning extremity, tissue manipulation, implantation of the prosthesis, suture management, wound closure, and  intraoperative decision-making.  Marin Shutter MD   Contact # [phone]

## 2021-06-29 NOTE — Anesthesia Procedure Notes (Addendum)
  Anesthesia Regional Block: Interscalene brachial plexus block   Pre-Anesthetic Checklist: , timeout performed,  Correct Patient, Correct Site, Correct Laterality,  Correct Procedure, Correct Position, site marked,  Risks and benefits discussed,  Surgical consent,  Pre-op evaluation,  At surgeon's request and post-op pain management  Laterality: Left  Prep: chloraprep       Needles:  Injection technique: Single-shot  Needle Type: Echogenic Stimulator Needle     Needle Length: 10cm  Needle Gauge: 21   Needle insertion depth: 7 cm   Additional Needles:   Procedures:,,,, ultrasound used (permanent image in chart),,   Motor weakness within 7 minutes.  Narrative:  Start time: 06/29/2021 6:56 AM End time: 06/29/2021 7:01 AM Injection made incrementally with aspirations every 5 mL.  Performed by: Personally  Anesthesiologist: Josephine Igo, MD  Additional Notes: Timeout performed. Patient sedated. Relevant anatomy ID'd using [place]. Incremental 2-48m injection of LA with frequent aspiration. Patient tolerated procedure well.

## 2021-06-29 NOTE — H&P (Signed)
Sheri Baker Complaint: left shoulder rotator cuff tear arthropathy HPI: The patient is a 70 y.o. female with chronic and progressively increasing left shoulder pain related to severe rotator cuff tear arthropathy.  Due to her increasing functional imitations and failure to respond to prolonged attempts at conservative management, she is brought to the operating room at this time for planned left shoulder reverse arthroplasty  Past Medical History:  Diagnosis Date   Anemia    Anxiety    Arthritis    Blood clot in vein    Cancer ([place])    cervical   Constipation    Diabetes mellitus without complication (HCC)    Type II- not on medications   Dysrhythmia    "palpations with full dose of hydrocodone"- takes 1/2 of Hydrocodone- Acetaminophen and does not have palpations   GERD (gastroesophageal reflux disease)    Headache    occasional   Hemorrhoids    History of hiatal hernia    Hypertension    Hypothyroidism     Past Surgical History:  Procedure Laterality Date   ABDOMINAL HYSTERECTOMY     partial -right ovary remains   BACK SURGERY     cataract     CERVIX REMOVAL     COLONOSCOPY     FOOT SURGERY Left    Callus- reaset bone   NASAL SINUS SURGERY     TONSILLECTOMY      Family History  Problem Relation Age of Onset   CAD Mother    Hypertension Mother    Hypertension Father    Diabetes Sister    Hypertension Sister    Cancer Sister     Social History:  reports that she has quit smoking. Her smoking use included cigarettes. She has a 3.75 pack-year smoking history. She has quit using smokeless tobacco. She reports current alcohol use of about 3.0 standard drinks per week. She reports that she does not use drugs.   Medications Prior to Admission  Medication Sig Dispense Refill   Cholecalciferol (VITAMIN D3 PO) Take 1 tablet by mouth daily.     CRESTOR 20 MG tablet Take 20 mg by mouth at bedtime.     fexofenadine (ALLEGRA) 180 MG tablet Take 180 mg by mouth  daily.     gabapentin (NEURONTIN) 400 MG capsule Take 400 mg by mouth 3 (three) times daily as needed (pain).     hydrochlorothiazide (HYDRODIURIL) 25 MG tablet Take 25 mg by mouth daily.     levocetirizine (XYZAL) 5 MG tablet Take 5 mg by mouth every evening.     levothyroxine (SYNTHROID) 88 MCG tablet TAKE 1 TABLET BY MOUTH EVERY DAY BEFORE breakfast 90 tablet 1   losartan (COZAAR) 100 MG tablet Take 100 mg by mouth daily.     methocarbamol (ROBAXIN) 750 MG tablet Take 1 tablet (750 mg total) by mouth 4 (four) times daily. (Patient taking differently: Take 750 mg by mouth 4 (four) times daily as needed for muscle spasms.) 120 tablet 1   Omega-3 Fatty Acids (OMEGA 3 PO) Take 5 capsules by mouth daily.     omeprazole (PRILOSEC) 40 MG capsule Take 40 mg by mouth daily.     POTASSIUM GLUCONATE PO Take 1 tablet by mouth in the morning and at bedtime.     spironolactone (ALDACTONE) 25 MG tablet Take 25 mg by mouth daily.     TURMERIC CURCUMIN PO Take 1 capsule by mouth daily.       Physical Exam: Left  shoulder demonstrates painful and guarded motion as noted at her recent office visits.  Global weakness to manual muscle testing.  Neurovascular intact left upper extremity.  Radiographs  Plain radiographs as well as MRI scan confirm changes consistent with chronic rotator cuff tear arthropathy  Vitals  Temp:  [98.1 F (36.7 C)] 98.1 F (36.7 C) (09/08 0538) Pulse Rate:  [113] 113 (09/08 0538) Resp:  [18] 18 (09/08 0538) BP: (133)/(91) 133/91 (09/08 0538) SpO2:  [93 %] 93 % (09/08 0538) Weight:  [126.1 kg] 126.1 kg (09/08 0603)  Assessment/Plan  Impression: left shoulder rotator cuff tear arthropathy  Plan of Action: Procedure(s): REVERSE SHOULDER ARTHROPLASTY  Roselind Klus M Triston Lisanti 06/29/2021, 6:11 AM Contact # [phone]

## 2021-06-29 NOTE — Anesthesia Procedure Notes (Signed)
Procedure Name: Intubation Date/Time: 06/29/2021 7:38 AM Performed by: Gerald Leitz, CRNA Pre-anesthesia Checklist: Patient identified, Patient being monitored, Timeout performed, Emergency Drugs available and Suction available Patient Re-evaluated:Patient Re-evaluated prior to induction Oxygen Delivery Method: Circle system utilized Preoxygenation: Pre-oxygenation with 100% oxygen Induction Type: IV induction Ventilation: Mask ventilation without difficulty Laryngoscope Size: Mac and 3 Grade View: Grade I Tube type: Oral Tube size: 7.0 mm Number of attempts: 1 Placement Confirmation: ETT inserted through vocal cords under direct vision, positive ETCO2 and breath sounds checked- equal and bilateral Secured at: 21 cm Tube secured with: Tape Dental Injury: Teeth and Oropharynx as per pre-operative assessment

## 2021-06-29 NOTE — Discharge Instructions (Signed)
 Kevin M. Supple, M.D., F.A.A.O.S. Orthopaedic Surgery Specializing in Arthroscopic and Reconstructive Surgery of the Shoulder [phone] [address] - [place], [place] [address] - Fax [phone]   POST-OP TOTAL SHOULDER REPLACEMENT INSTRUCTIONS  1. Follow up in the office for your first post-op appointment 10-14 days from the date of your surgery. If you do not already have a scheduled appointment, our office will contact you to schedule.  2. The bandage over your incision is waterproof. You may begin showering with this dressing on. You may leave this dressing on until first follow up appointment within 2 weeks. We prefer you leave this dressing in place until follow up however after 5-7 days if you are having itching or skin irritation and would like to remove it you may do so. Go slow and tug at the borders gently to break the bond the dressing has with the skin. At this point if there is no drainage it is okay to go without a bandage or you may cover it with a light guaze and tape. You can also expect significant bruising around your shoulder that will drift down your arm and into your chest wall. This is very normal and should resolve over several days.   3. Wear your sling/immobilizer at all times except to perform the exercises below or to occasionally let your arm dangle by your side to stretch your elbow. You also need to sleep in your sling immobilizer until instructed otherwise. It is ok to remove your sling if you are sitting in a controlled environment and allow your arm to rest in a position of comfort by your side or on your lap with pillows to give your neck and skin a break from the sling. You may remove it to allow arm to dangle by side to shower. If you are up walking around and when you go to sleep at night you need to wear it.  4. Range of motion to your elbow, wrist, and hand are encouraged 3-5 times daily. Exercise to your hand and fingers helps to reduce  swelling you may experience.   5. Prescriptions for a pain medication and a muscle relaxant are provided for you. It is recommended that if you are experiencing pain that you pain medication alone is not controlling, add the muscle relaxant along with the pain medication which can give additional pain relief. The first 1-2 days is generally the most severe of your pain and then should gradually decrease. As your pain lessens it is recommended that you decrease your use of the pain medications to an "as needed basis'" only and to always comply with the recommended dosages of the pain medications.  6. Pain medications can produce constipation along with their use. If you experience this, the use of an over the counter stool softener or laxative daily is recommended.   7. For additional questions or concerns, please do not hesitate to call the office. If after hours there is an answering service to forward your concerns to the physician on call.  8.Pain control following an exparel block  To help control your post-operative pain you received a nerve block  performed with Exparel which is a long acting anesthetic (numbing agent) which can provide pain relief and sensations of numbness (and relief of pain) in the operative shoulder and arm for up to 3 days. Sometimes it provides mixed relief, meaning you may still have numbness in certain areas of the arm but can still be able to   move  parts of that arm, hand, and fingers. We recommend that your prescribed pain medications  be used as needed. We do not feel it is necessary to "pre medicate" and "stay ahead" of pain.  Taking narcotic pain medications when you are not having any pain can lead to unnecessary and potentially dangerous side effects.    9. Use the ice machine as much as possible in the first 5-7 days from surgery, then you can wean its use to as needed. The ice typically needs to be replaced every 6 hours, instead of ice you can actually freeze  water bottles to put in the cooler and then fill water around them to avoid having to purchase ice. You can have spare water bottles freezing to allow you to rotate them once they have melted. Try to have a thin shirt or light cloth or towel under the ice wrap to protect your skin.   FOR ADDITIONAL INFO ON ICE MACHINE AND INSTRUCTIONS GO TO THE WEBSITE AT  https://[email or web address].com/products/donjoy/[email or web address]  10.  We recommend that you avoid any dental work or cleaning in the first 3 months following your joint replacement. This is to help minimize the possibility of infection from the bacteria in your mouth that enters your bloodstream during dental work. We also recommend that you take an antibiotic prior to your dental work for the first year after your shoulder replacement to further help reduce that risk. Please simply contact our office for antibiotics to be sent to your pharmacy prior to dental work.  11. Dental Antibiotics:  In most cases prophylactic antibiotics for Dental procdeures after total joint surgery are not necessary.  Exceptions are as follows:  1. History of prior total joint infection  2. Severely immunocompromised (Organ Transplant, cancer chemotherapy, Rheumatoid biologic meds such as Humera)  3. Poorly controlled diabetes (A1C &gt; 8.0, blood glucose over 200)  If you have one of these conditions, contact your surgeon for an antibiotic prescription, prior to your dental procedure.   POST-OP EXERCISES  Pendulum Exercises  Perform pendulum exercises while standing and bending at the waist. Support your uninvolved arm on a table or chair and allow your operated arm to hang freely. Make sure to do these exercises passively - not using you shoulder muscles. These exercises can be performed once your nerve block effects have worn off.  Repeat 20 times. Do 3 sessions per day.     

## 2021-06-29 NOTE — Evaluation (Signed)
Occupational Therapy Evaluation Patient Details Name: Sheri Baker MRN: [id number] DOB: 07-Dec-1950 Today's Date: 06/29/2021    History of Present Illness Patient is a 70 year old female s/p L reverse total shoulder arthroplasty. PMH includes back surgery, diabetes   Clinical Impression   Patient is a 70 year old female s/p shoulder replacement without functional use of left non-dominant upper extremity secondary to effects of surgery and interscalene block and shoulder precautions. Therapist provided education and instruction to patient and sister in regards to exercises, precautions, positioning, donning upper extremity clothing and bathing while maintaining shoulder precautions, ice and edema management and donning/doffing sling. Patient and sister verbalized understanding and demonstrated as needed. Patient needed assistance to donn shirt, underwear, pants, socks and shoes and provided with instruction on compensatory strategies to perform ADLs. Patient to follow up with MD for further therapy needs.      Follow Up Recommendations  Follow surgeon's recommendation for DC plan and follow-up therapies    Equipment Recommendations  None recommended by OT       Precautions / Restrictions Precautions Precautions: Shoulder Type of Shoulder Precautions: If sitting in controlled environment, ok to come out of sling to give neck a break. Please sleep in it to protect until follow up in office.OK to use operative arm for feeding, hygiene and ADLs. Ok to instruct Pendulums and lap slides as exercises. Ok to use operative arm within the following parameters for ADL purposes  Ok for PROM, AAROM, AROM within pain tolerance and within the following ROM ER 20 ABD 45 FE 60. AROM elbow, wrist, hand ok Shoulder Interventions: Shoulder sling/immobilizer;Off for dressing/bathing/exercises Precaution Booklet Issued: Yes (comment) Required Braces or Orthoses: Sling Restrictions Weight Bearing Restrictions:  Yes LUE Weight Bearing: Non weight bearing      Mobility Bed Mobility                    Transfers Overall transfer level: Independent Equipment used: None                  Balance Overall balance assessment: No apparent balance deficits (not formally assessed)                                         ADL either performed or assessed with clinical judgement   ADL Overall ADL's : Needs assistance/impaired Eating/Feeding: Independent   Grooming: Independent   Upper Body Bathing: Minimal assistance   Lower Body Bathing: Minimal assistance   Upper Body Dressing : Moderate assistance   Lower Body Dressing: Moderate assistance Lower Body Dressing Details (indicate cue type and reason): patient brought jumpsuit to get dressed, needing initial assist to thread legs and pull up over L side of body Toilet Transfer: Independent   Toileting- Clothing Manipulation and Hygiene: Moderate assistance       Functional mobility during ADLs: Independent General ADL Comments: patient and sister educated in compensatory strategies for self care tasks in order to maintain shoulder precautions      Pertinent Vitals/Pain Pain Assessment: Faces Faces Pain Scale: Hurts a little bit Pain Location: L UE Pain Descriptors / Indicators: Heaviness;Numbness Pain Intervention(s): Monitored during session     Hand Dominance Right   Extremity/Trunk Assessment Upper Extremity Assessment Upper Extremity Assessment: LUE deficits/detail LUE Deficits / Details: + nerve block   Lower Extremity Assessment Lower Extremity Assessment: Overall WFL for tasks assessed  Communication Communication Communication: No difficulties   Cognition Arousal/Alertness: Awake/alert Behavior During Therapy: WFL for tasks assessed/performed Overall Cognitive Status: Within Functional Limits for tasks assessed                                        Exercises  Exercises: Shoulder   Shoulder Instructions Shoulder Instructions Donning/doffing shirt without moving shoulder: Caregiver independent with task;Patient able to independently direct caregiver;Moderate assistance Method for sponge bathing under operated UE: Minimal assistance;Caregiver independent with task;Patient able to independently direct caregiver Donning/doffing sling/immobilizer: Maximal assistance;Caregiver independent with task;Patient able to independently direct caregiver Correct positioning of sling/immobilizer: Minimal assistance;Caregiver independent with task;Patient able to independently direct caregiver Pendulum exercises (written home exercise program): Caregiver independent with task;Patient able to independently direct caregiver ROM for elbow, wrist and digits of operated UE: Caregiver independent with task;Patient able to independently direct caregiver Sling wearing schedule (on at all times/off for ADL's): Caregiver independent with task;Patient able to independently direct caregiver Proper positioning of operated UE when showering: Caregiver independent with task;Patient able to independently direct caregiver Dressing change:  (N/A) Positioning of UE while sleeping: Caregiver independent with task;Patient able to independently direct caregiver    Home Living Family/patient expects to be discharged to:: Private residence Living Arrangements: Alone Available Help at Discharge: Family;Available 24 hours/day (sister staying with for a week) Type of Home: Apartment Home Access: Elevator     Home Layout: One level     Bathroom Shower/Tub: Teacher, early years/pre: Standard     Home Equipment: Environmental consultant - 2 wheels          Prior Functioning/Environment Level of Independence: Independent                 OT Problem List: Pain;Impaired UE functional use;Decreased knowledge of precautions         OT Goals(Current goals can be found in the care plan  section) Acute Rehab OT Goals Patient Stated Goal: home OT Goal Formulation: All assessment and education complete, DC therapy   AM-PAC OT "6 Clicks" Daily Activity     Outcome Measure Help from another person eating meals?: None Help from another person taking care of personal grooming?: None Help from another person toileting, which includes using toliet, bedpan, or urinal?: A Lot Help from another person bathing (including washing, rinsing, drying)?: A Lot Help from another person to put on and taking off regular upper body clothing?: A Lot Help from another person to put on and taking off regular lower body clothing?: A Lot 6 Click Score: 16   End of Session Equipment Utilized During Treatment: Other (comment) (sling) Nurse Communication: Other (comment) (OT complete)  Activity Tolerance: Patient tolerated treatment well Patient left: in chair;with call bell/phone within reach;with family/visitor present  OT Visit Diagnosis: Pain Pain - Right/Left: Left Pain - part of body: Shoulder                Time: 1355-1426 OT Time Calculation (min): 31 min Charges:  OT General Charges $OT Visit: 1 Visit OT Evaluation $OT Eval Low Complexity: 1 Low OT Treatments $Self Care/Home Management : 8-22 mins  Delbert Phenix OT OT pager: [phone]  Rosemary Holms 06/29/2021, 2:46 PM

## 2021-06-29 NOTE — Anesthesia Postprocedure Evaluation (Signed)
Anesthesia Post Note  Patient: Sheri Baker  Procedure(s) Performed: REVERSE SHOULDER ARTHROPLASTY (Left: Shoulder)     Patient location during evaluation: PACU Anesthesia Type: General Level of consciousness: awake and alert and oriented Pain management: pain level controlled Vital Signs Assessment: post-procedure vital signs reviewed and stable Respiratory status: spontaneous breathing, nonlabored ventilation and respiratory function stable Cardiovascular status: blood pressure returned to baseline and stable Postop Assessment: no apparent nausea or vomiting Anesthetic complications: no   No notable events documented.  Last Vitals:  Vitals:   06/29/21 1015 06/29/21 1023  BP: 115/72   Pulse: (!) 111 (!) 104  Resp: 12 (!) 9  Temp:    SpO2: 90% 94%    Last Pain:  Vitals:   06/29/21 1015  TempSrc:   PainSc: 7                  Bandy Honaker A.

## 2021-06-29 NOTE — Transfer of Care (Signed)
Immediate Anesthesia Transfer of Care Note  Patient: Sheri Baker  Procedure(s) Performed: Procedure(s): REVERSE SHOULDER ARTHROPLASTY (Left)  Patient Location: PACU  Anesthesia Type:General  Level of Consciousness: Alert, Awake, Oriented  Airway & Oxygen Therapy: Patient Spontanous Breathing  Post-op Assessment: Report given to RN  Post vital signs: Reviewed and stable  Last Vitals:  Vitals:   06/29/21 0538  BP: (!) 133/91  Pulse: (!) 113  Resp: 18  Temp: 36.7 C  SpO2: [id number]    Complications: No apparent anesthesia complications

## 2021-07-05 ENCOUNTER — Encounter (HOSPITAL_COMMUNITY): Payer: Self-pay | Admitting: Orthopedic Surgery

## 2021-09-20 LAB — TSH: TSH: 0.26 — AB (ref 0.41–5.90)

## 2021-09-27 ENCOUNTER — Ambulatory Visit (INDEPENDENT_AMBULATORY_CARE_PROVIDER_SITE_OTHER): Payer: Medicare Other | Admitting: "Endocrinology

## 2021-09-27 ENCOUNTER — Other Ambulatory Visit: Payer: Self-pay

## 2021-09-27 ENCOUNTER — Encounter: Payer: Self-pay | Admitting: "Endocrinology

## 2021-09-27 VITALS — BP 116/78 | HR 96 | Ht 65.0 in | Wt 280.0 lb

## 2021-09-27 DIAGNOSIS — E039 Hypothyroidism, unspecified: Secondary | ICD-10-CM | POA: Diagnosis not present

## 2021-09-27 MED ORDER — LEVOTHYROXINE SODIUM 88 MCG PO TABS: ORAL_TABLET | ORAL | 1 refills | Status: DC

## 2021-09-27 NOTE — Progress Notes (Signed)
09/27/2021, 2:45 PM                           Endocrinology follow-up note    Sheri Baker is a 70 y.o.-year-old female patient being seen in follow-up for hypothyroidism, prediabetes.  [id number], Provider, MD.   Past Medical History:  Diagnosis Date   Anemia    Anxiety    Arthritis    Blood clot in vein    Cancer ([place])    cervical   Constipation    Diabetes mellitus without complication (HCC)    Type II- not on medications   Dysrhythmia    "palpations with full dose of hydrocodone"- takes 1/2 of Hydrocodone- Acetaminophen and does not have palpations   GERD (gastroesophageal reflux disease)    Headache    occasional   Hemorrhoids    History of hiatal hernia    Hypertension    Hypothyroidism    Past Surgical History:  Procedure Laterality Date   ABDOMINAL HYSTERECTOMY     partial -right ovary remains   BACK SURGERY     cataract     CERVIX REMOVAL     COLONOSCOPY     FOOT SURGERY Left    Callus- reaset bone   NASAL SINUS SURGERY     REVERSE SHOULDER ARTHROPLASTY Left 06/29/2021   Procedure: REVERSE SHOULDER ARTHROPLASTY;  Surgeon: Justice Britain, MD;  Location: [facility];  Service: Orthopedics;  Laterality: Left;   TONSILLECTOMY     Social History   Socioeconomic History   Marital status: Single    Spouse name: Not on file   Number of children: Not on file   Years of education: Not on file   Highest education level: Not on file  Occupational History   Not on file  Tobacco Use   Smoking status: Former    Packs/day: 0.25    Years: 15.00    Pack years: 3.75    Types: Cigarettes   Smokeless tobacco: Former   Tobacco comments:    quit approx age 28 e 17  Vaping Use   Vaping Use: Never used  Substance and Sexual Activity   Alcohol use: Yes    Alcohol/week: 3.0 standard drinks    Types: 3 Glasses of wine per week    Comment: occas.   Drug  use: No   Sexual activity: Not on file  Other Topics Concern   Not on file  Social History Narrative   Not on file   Social Determinants of Health   Financial Resource Strain: Not on file  Food Insecurity: Not on file  Transportation Needs: Not on file  Physical Activity: Not on file  Stress: Not on file  Social Connections: Not on file   Outpatient Encounter Medications as of 09/27/2021  Medication Sig   Cholecalciferol (VITAMIN  D3 PO) Take 1 tablet by mouth daily.   CRESTOR 20 MG tablet Take 20 mg by mouth at bedtime.   fexofenadine (ALLEGRA) 180 MG tablet Take 180 mg by mouth daily.   gabapentin (NEURONTIN) 400 MG capsule Take 400 mg by mouth 3 (three) times daily as needed (pain).   hydrochlorothiazide (HYDRODIURIL) 25 MG tablet Take 25 mg by mouth daily.   levocetirizine (XYZAL) 5 MG tablet Take 5 mg by mouth every evening.   levothyroxine (SYNTHROID) 88 MCG tablet TAKE 1 TABLET BY MOUTH EVERY DAY BEFORE breakfast   losartan (COZAAR) 100 MG tablet Take 100 mg by mouth daily.   methocarbamol (ROBAXIN) 750 MG tablet Take 1 tablet (750 mg total) by mouth 4 (four) times daily as needed for muscle spasms.   Omega-3 Fatty Acids (OMEGA 3 PO) Take 5 capsules by mouth daily.   omeprazole (PRILOSEC) 40 MG capsule Take 40 mg by mouth daily.   ondansetron (ZOFRAN) 4 MG tablet Take 1 tablet (4 mg total) by mouth every 8 (eight) hours as needed for nausea or vomiting.   POTASSIUM GLUCONATE PO Take 1 tablet by mouth in the morning and at bedtime.   spironolactone (ALDACTONE) 25 MG tablet Take 25 mg by mouth daily.   TURMERIC CURCUMIN PO Take 1 capsule by mouth daily.   [DISCONTINUED] HYDROmorphone (DILAUDID) 2 MG tablet Take 1 tablet (2 mg total) by mouth every 4 (four) hours as needed.   [DISCONTINUED] levothyroxine (SYNTHROID) 88 MCG tablet TAKE 1 TABLET BY MOUTH EVERY DAY BEFORE breakfast   [DISCONTINUED] naproxen (NAPROSYN) 500 MG tablet Take 1 tablet (500 mg total) by mouth 2 (two) times  daily with a meal.   No facility-administered encounter medications on file as of 09/27/2021.   ALLERGIES: Allergies  Allergen Reactions   Mango Flavor Hives and Swelling    Swelling of throat   Hydrocodone Palpitations    Pt states this was only with high dose, tolerated and had good relief with low dose   Penicillins Rash    Has patient had a PCN reaction causing immediate rash, facial/tongue/throat swelling, SOB or lightheadedness with hypotension: Yes Has patient had a PCN reaction causing severe rash involving mucus membranes or skin necrosis: No Has patient had a PCN reaction that required hospitalization No Has patient had a PCN reaction occurring within the last 10 years: No If all of the above answers are "NO", then may proceed with Cephalosporin use.    VACCINATION STATUS: Immunization History  Administered Date(s) Administered   Pneumococcal Polysaccharide-23 03/28/2016     HPI    Sheri Baker  is a patient with the above medical history. she was diagnosed  with hypothyroidism at approximate age of 89 years with lab work after suggestive clinical symptoms.  She is currently on levothyroxine 88 mcg p.o. daily before breakfast.  She has no new complaints today.  Her previsit thyroid function tests are consistent with appropriate replacement.     She denies palpitations, tremors, nor dysphagia.  She is scheduled for shoulder surgery in the next few days.   she denies family history of  thyroid disorders.  No family history of thyroid cancer.  No history of  radiation therapy to head or neck. No recent use of iodine supplements. -She has history of prediabetes with A1c of 6.3%, presently 6.2 %.  She is not on intervention.   ROS: Limited as above.   Physical Exam: BP 116/78   Pulse 96   Ht 5\' 5"  (1.651 m)  Wt 280 lb (127 kg)   BMI 46.59 kg/m  Wt Readings from Last 3 Encounters:  09/27/21 280 lb (127 kg)  06/29/21 278 lb (126.1 kg)  06/27/21 278 lb (126.1  kg)     Physical Exam- Limited  Constitutional:  Body mass index is 46.59 kg/m. , not in acute distress, normal state of mind   CMP ( most recent) CMP     Component Value Date/Time   NA 138 06/16/2021 1109   K 3.7 06/16/2021 1109   CL 102 06/16/2021 1109   CO2 29 06/16/2021 1109   GLUCOSE 106 (H) 06/16/2021 1109   BUN 12 06/16/2021 1109   BUN 10 07/22/2018 0000   CREATININE 0.79 06/16/2021 1109   CALCIUM 9.3 06/16/2021 1109   PROT 5.9 (L) 03/31/2016 0421   ALBUMIN 2.4 (L) 03/31/2016 0421   AST 18 03/31/2016 0421   ALT 18 03/31/2016 0421   ALKPHOS 53 03/31/2016 0421   BILITOT 0.6 03/31/2016 0421   GFRNONAA >60 06/16/2021 1109   GFRAA >60 04/13/2016 0631     Diabetic Labs (most recent): Lab Results  Component Value Date   HGBA1C 6.7 (H) 06/16/2021   HGBA1C 6.2 01/09/2021   HGBA1C 6.0 07/22/2018     Lipid Panel ( most recent) Lipid Panel     Component Value Date/Time   CHOL 148 07/22/2018 0000   TRIG 92 07/22/2018 0000   HDL 47 07/22/2018 0000   LDLCALC 83 07/22/2018 0000       Lab Results  Component Value Date   TSH 0.26 (A) 09/20/2021   TSH 0.08 (A) 06/20/2021   TSH 0.20 (A) 03/17/2021   TSH 0.21 (A) 12/30/2020   TSH 0.19 (A) 07/29/2020   TSH 0.23 (A) 07/24/2019   TSH 0.04 (A) 03/20/2019   TSH 0.30 (A) 07/22/2018       ASSESSMENT: 1.  Hypothyroidism 2.  Prediabetes 3.  Multiple thyroid nodule  PLAN:   -Her previsit thyroid function tests are consistent with appropriate replacement.  She is advised to continue levothyroxine 88 mcg p.o. daily before breakfast.    - We discussed about the correct intake of her thyroid hormone, on empty stomach at fasting, with water, separated by at least 30 minutes from breakfast and other medications,  and separated by more than 4 hours from calcium, iron, multivitamins, acid reflux medications (PPIs). -Patient is made aware of the fact that thyroid hormone replacement is needed for life, dose to be adjusted  by periodic monitoring of thyroid function tests.  -Given palpable thyroid on physical exam, she underwent thyroid ultrasound showing subcentimeter cystic nodules bilaterally.  Her thyroid is not generally enlarged, consistent with heterogeneous echotexture.  Her A1c is now 6.7% consistent with type 2 diabetes.  she is encouraged to continue on her lifestyle change.  She is not on any medication intervention. - she acknowledges that there is a room for improvement in her food and drink choices. - Suggestion is made for her to avoid simple carbohydrates  from her diet including Cakes, Sweet Desserts, Ice Cream, Soda (diet and regular), Sweet Tea, Candies, Chips, Cookies, Store Bought Juices, Alcohol in Excess of  1-2 drinks a day, Artificial Sweeteners,  Coffee Creamer, and "Sugar-free" Products, Lemonade. This will help patient to have more stable blood glucose profile and potentially avoid unintended weight gain.   She is advised to continue close follow-up with her PMD.  Return in about 6 months (around 03/28/2022) for F/U with Pre-visit Labs, A1c -NV.  I spent 21 minutes in the care of the patient today including review of labs from Thyroid Function, CMP, and other relevant labs ; imaging/biopsy records (current and previous including abstractions from other facilities); face-to-face time discussing  her lab results and symptoms, medications doses, her options of short and long term treatment based on the latest standards of care / guidelines;   and documenting the encounter.  Sheri Baker  participated in the discussions, expressed understanding, and voiced agreement with the above plans.  All questions were answered to her satisfaction. she is encouraged to contact clinic should she have any questions or concerns prior to her return visit.    Glade Lloyd, MD [facility] [address] [place], [place] [address] Phone: [phone]   Fax: [phone]   09/27/2021, 2:45 PM  This note was partially dictated with voice recognition software. Similar sounding words can be transcribed inadequately or may not  be corrected upon review.

## 2022-02-28 ENCOUNTER — Other Ambulatory Visit: Payer: Self-pay | Admitting: "Endocrinology

## 2022-02-28 DIAGNOSIS — E039 Hypothyroidism, unspecified: Secondary | ICD-10-CM

## 2022-03-22 LAB — TSH: TSH: 0.27 — AB (ref 0.41–5.90)

## 2022-03-27 ENCOUNTER — Encounter: Payer: Self-pay | Admitting: "Endocrinology

## 2022-03-28 ENCOUNTER — Ambulatory Visit (INDEPENDENT_AMBULATORY_CARE_PROVIDER_SITE_OTHER): Payer: Medicare Other | Admitting: "Endocrinology

## 2022-03-28 ENCOUNTER — Encounter: Payer: Self-pay | Admitting: "Endocrinology

## 2022-03-28 VITALS — BP 102/64 | HR 68 | Ht 65.0 in | Wt 265.8 lb

## 2022-03-28 DIAGNOSIS — E042 Nontoxic multinodular goiter: Secondary | ICD-10-CM

## 2022-03-28 DIAGNOSIS — E119 Type 2 diabetes mellitus without complications: Secondary | ICD-10-CM

## 2022-03-28 DIAGNOSIS — E039 Hypothyroidism, unspecified: Secondary | ICD-10-CM

## 2022-03-28 LAB — POCT GLYCOSYLATED HEMOGLOBIN (HGB A1C): HbA1c, POC (controlled diabetic range): 6.2 % (ref 0.0–7.0)

## 2022-03-28 MED ORDER — TRULICITY 1.5 MG/0.5ML ~~LOC~~ SOAJ: 1.5000 mg | SUBCUTANEOUS | 2 refills | Status: DC

## 2022-03-28 MED ORDER — LEVOTHYROXINE SODIUM 75 MCG PO TABS: ORAL_TABLET | ORAL | 1 refills | Status: DC

## 2022-03-28 NOTE — Progress Notes (Signed)
03/28/2022, 5:44 PM                           Endocrinology follow-up note    Sheri Baker is a 71 y.o.-year-old female patient being seen in follow-up for hypothyroidism, prediabetes.  [id number], Provider, MD.   Past Medical History:  Diagnosis Date   Anemia    Anxiety    Arthritis    Blood clot in vein    Cancer ([place])    cervical   Constipation    Diabetes mellitus without complication (HCC)    Type II- not on medications   Dysrhythmia    "palpations with full dose of hydrocodone"- takes 1/2 of Hydrocodone- Acetaminophen and does not have palpations   GERD (gastroesophageal reflux disease)    Headache    occasional   Hemorrhoids    History of hiatal hernia    Hypertension    Hypothyroidism    Past Surgical History:  Procedure Laterality Date   ABDOMINAL HYSTERECTOMY     partial -right ovary remains   BACK SURGERY     cataract     CERVIX REMOVAL     COLONOSCOPY     FOOT SURGERY Left    Callus- reaset bone   NASAL SINUS SURGERY     REVERSE SHOULDER ARTHROPLASTY Left 06/29/2021   Procedure: REVERSE SHOULDER ARTHROPLASTY;  Surgeon: Justice Britain, MD;  Location: [facility];  Service: Orthopedics;  Laterality: Left;   TONSILLECTOMY     Social History   Socioeconomic History   Marital status: Single    Spouse name: Not on file   Number of children: Not on file   Years of education: Not on file   Highest education level: Not on file  Occupational History   Not on file  Tobacco Use   Smoking status: Former    Packs/day: 0.25    Years: 15.00    Pack years: 3.75    Types: Cigarettes   Smokeless tobacco: Former   Tobacco comments:    quit approx age 89 e 85  Vaping Use   Vaping Use: Never used  Substance and Sexual Activity   Alcohol use: Yes    Alcohol/week: 3.0 standard drinks    Types: 3 Glasses of wine per week    Comment: occas.   Drug  use: No   Sexual activity: Not on file  Other Topics Concern   Not on file  Social History Narrative   Not on file   Social Determinants of Health   Financial Resource Strain: Not on file  Food Insecurity: Not on file  Transportation Needs: Not on file  Physical Activity: Not on file  Stress: Not on file  Social Connections: Not on file   Outpatient Encounter Medications as of 03/28/2022  Medication Sig   Dulaglutide ([place])  1.5 MG/0.5ML SOPN Inject 1.5 mg into the skin once a week.   FOLIC ACID PO Take 1 tablet by mouth daily.   Cholecalciferol (VITAMIN D3 PO) Take 1 tablet by mouth daily.   CRESTOR 20 MG tablet Take 20 mg by mouth at bedtime.   fexofenadine (ALLEGRA) 180 MG tablet Take 180 mg by mouth daily.   gabapentin (NEURONTIN) 400 MG capsule Take 400 mg by mouth 3 (three) times daily as needed (pain).   hydrochlorothiazide (HYDRODIURIL) 25 MG tablet Take 25 mg by mouth daily.   levocetirizine (XYZAL) 5 MG tablet Take 5 mg by mouth every evening.   levothyroxine (SYNTHROID) 75 MCG tablet TAKE 1 TABLET BY MOUTH DAILY  BEFORE BREAKFAST   losartan (COZAAR) 100 MG tablet Take 100 mg by mouth daily.   methocarbamol (ROBAXIN) 750 MG tablet Take 1 tablet (750 mg total) by mouth 4 (four) times daily as needed for muscle spasms.   Omega-3 Fatty Acids (OMEGA 3 PO) Take 5 capsules by mouth daily.   omeprazole (PRILOSEC) 40 MG capsule Take 40 mg by mouth daily.   ondansetron (ZOFRAN) 4 MG tablet Take 1 tablet (4 mg total) by mouth every 8 (eight) hours as needed for nausea or vomiting.   POTASSIUM GLUCONATE PO Take 1 tablet by mouth in the morning and at bedtime.   spironolactone (ALDACTONE) 25 MG tablet Take 25 mg by mouth daily.   TURMERIC CURCUMIN PO Take 1 capsule by mouth daily.   [DISCONTINUED] levothyroxine (SYNTHROID) 88 MCG tablet TAKE 1 TABLET BY MOUTH DAILY  BEFORE BREAKFAST   No facility-administered encounter medications on file as of 03/28/2022.   ALLERGIES: Allergies   Allergen Reactions   Mango Flavor Hives and Swelling    Swelling of throat   Hydrocodone Palpitations    Pt states this was only with high dose, tolerated and had good relief with low dose   Penicillins Rash    Has patient had a PCN reaction causing immediate rash, facial/tongue/throat swelling, SOB or lightheadedness with hypotension: Yes Has patient had a PCN reaction causing severe rash involving mucus membranes or skin necrosis: No Has patient had a PCN reaction that required hospitalization No Has patient had a PCN reaction occurring within the last 10 years: No If all of the above answers are "NO", then may proceed with Cephalosporin use.    VACCINATION STATUS: Immunization History  Administered Date(s) Administered   Pneumococcal Polysaccharide-23 03/28/2016     HPI    Sheri Baker  is a patient with the above medical history. she was diagnosed with hypothyroidism at approximate age of 22 years with lab work.    She is currently on levothyroxine 88 mcg p.o. daily before breakfast.  She has no new complaints today.  Her previsit thyroid function tests are consistent with slight over replacement.  nt.     She denies palpitations, tremors, nor dysphagia.  She is scheduled for shoulder surgery in the next few days. Her complaint today is that she is unable to lose weight.  She wants to be initiated on medications for diabetes management.  Her point-of-care A1c is 6.2% today.   she denies family history of  thyroid disorders.  No family history of thyroid cancer.  No history of  radiation therapy to head or neck.  ROS: Limited as above.   Physical Exam: BP 102/64   Pulse 68   Ht '5\' 5"'$  (1.651 m)   Wt 265 lb 12.8 oz (120.6 kg)   BMI 44.23 kg/m  Wt Readings from Last 3 Encounters:  03/28/22 265 lb 12.8 oz (120.6 kg)  09/27/21 280 lb (127 kg)  06/29/21 278 lb (126.1 kg)     Physical Exam- Limited  Constitutional:  Body mass index is 44.23 kg/m. , not in acute  distress, normal state of mind   CMP ( most recent) CMP     Component Value Date/Time   NA 138 06/16/2021 1109   K 3.7 06/16/2021 1109   CL 102 06/16/2021 1109   CO2 29 06/16/2021 1109   GLUCOSE 106 (H) 06/16/2021 1109   BUN 12 06/16/2021 1109   BUN 10 07/22/2018 0000   CREATININE 0.79 06/16/2021 1109   CALCIUM 9.3 06/16/2021 1109   PROT 5.9 (L) 03/31/2016 0421   ALBUMIN 2.4 (L) 03/31/2016 0421   AST 18 03/31/2016 0421   ALT 18 03/31/2016 0421   ALKPHOS 53 03/31/2016 0421   BILITOT 0.6 03/31/2016 0421   GFRNONAA >60 06/16/2021 1109   GFRAA >60 04/13/2016 0631     Diabetic Labs (most recent): Lab Results  Component Value Date   HGBA1C 6.2 03/28/2022   HGBA1C 6.7 (H) 06/16/2021   HGBA1C 6.2 01/09/2021     Lipid Panel ( most recent) Lipid Panel     Component Value Date/Time   CHOL 148 07/22/2018 0000   TRIG 92 07/22/2018 0000   HDL 47 07/22/2018 0000   LDLCALC 83 07/22/2018 0000       Lab Results  Component Value Date   TSH 0.27 (A) 03/22/2022   TSH 0.26 (A) 09/20/2021   TSH 0.08 (A) 06/20/2021   TSH 0.20 (A) 03/17/2021   TSH 0.21 (A) 12/30/2020   TSH 0.19 (A) 07/29/2020   TSH 0.23 (A) 07/24/2019   TSH 0.04 (A) 03/20/2019   TSH 0.30 (A) 07/22/2018       ASSESSMENT: 1.  Hypothyroidism 2.  Prediabetes 3.  Multiple thyroid nodule  PLAN:   -Her previsit thyroid function tests are consistent with slight over-replacement.  I discussed and lowered her levothyroxine to 75 mcg p.o. daily before breakfast.    - We discussed about the correct intake of her thyroid hormone, on empty stomach at fasting, with water, separated by at least 30 minutes from breakfast and other medications,  and separated by more than 4 hours from calcium, iron, multivitamins, acid reflux medications (PPIs). -Patient is made aware of the fact that thyroid hormone replacement is needed for life, dose to be adjusted by periodic monitoring of thyroid function tests.   -Given palpable  thyroid on physical exam, she underwent thyroid ultrasound showing subcentimeter cystic nodules bilaterally.  Her thyroid is not generally enlarged, consistent with heterogeneous echotexture.  Her A1c is now 6.2%.  She would like to be initiated on GLP-1 receptor agonist.  I discussed initiated Trulicity 1.5 mg subcutaneously weekly.  She will benefit from this intervention.  Side effects and precautions discussed with her.  - she acknowledges that there is a room for improvement in her food and drink choices. - Suggestion is made for her to avoid simple carbohydrates  from her diet including Cakes, Sweet Desserts, Ice Cream, Soda (diet and regular), Sweet Tea, Candies, Chips, Cookies, Store Bought Juices, Alcohol in Excess of  1-2 drinks a day, Artificial Sweeteners,  Coffee Creamer, and "Sugar-free" Products, Lemonade. This will help patient to have more stable blood glucose profile and potentially avoid unintended weight gain.  She is advised to continue close follow-up with her PMD.  Return in about 6 months (  around 09/27/2022) for Fasting Labs  in AM B4 8.    I spent 21 minutes in the care of the patient today including review of labs from Thyroid Function, CMP, and other relevant labs ; imaging/biopsy records (current and previous including abstractions from other facilities); face-to-face time discussing  her lab results and symptoms, medications doses, her options of short and long term treatment based on the latest standards of care / guidelines;   and documenting the encounter.  Sheri Baker  participated in the discussions, expressed understanding, and voiced agreement with the above plans.  All questions were answered to her satisfaction. she is encouraged to contact clinic should she have any questions or concerns prior to her return visit.     Glade Lloyd, MD [facility] [address] [place], [place] [address] Phone:  [phone]  Fax: [phone]   03/28/2022, 5:44 PM  This note was partially dictated with voice recognition software. Similar sounding words can be transcribed inadequately or may not  be corrected upon review.

## 2022-03-28 NOTE — Patient Instructions (Signed)

## 2022-04-03 ENCOUNTER — Telehealth: Payer: Self-pay | Admitting: "Endocrinology

## 2022-04-03 DIAGNOSIS — E119 Type 2 diabetes mellitus without complications: Secondary | ICD-10-CM

## 2022-04-03 DIAGNOSIS — E039 Hypothyroidism, unspecified: Secondary | ICD-10-CM

## 2022-04-03 NOTE — Telephone Encounter (Signed)
Patient states her Trulicity and Levothyroxine was sent to wrong pharmacy. These 2 need to be sent to [facility]

## 2022-04-04 MED ORDER — LEVOTHYROXINE SODIUM 75 MCG PO TABS: ORAL_TABLET | ORAL | 1 refills | Status: DC

## 2022-04-04 MED ORDER — TRULICITY 1.5 MG/0.5ML ~~LOC~~ SOAJ: 1.5000 mg | SUBCUTANEOUS | 2 refills | Status: DC

## 2022-04-04 NOTE — Telephone Encounter (Signed)
Sheri Baker, Patient is calling back

## 2022-04-04 NOTE — Telephone Encounter (Signed)
Prescriptions sent to [facility].

## 2022-06-12 ENCOUNTER — Other Ambulatory Visit: Payer: Self-pay | Admitting: "Endocrinology

## 2022-06-12 DIAGNOSIS — E039 Hypothyroidism, unspecified: Secondary | ICD-10-CM

## 2022-06-14 ENCOUNTER — Other Ambulatory Visit: Payer: Self-pay | Admitting: "Endocrinology

## 2022-06-14 DIAGNOSIS — E119 Type 2 diabetes mellitus without complications: Secondary | ICD-10-CM

## 2022-09-18 ENCOUNTER — Telehealth: Payer: Self-pay

## 2022-09-18 DIAGNOSIS — E119 Type 2 diabetes mellitus without complications: Secondary | ICD-10-CM

## 2022-09-18 MED ORDER — VICTOZA 18 MG/3ML ~~LOC~~ SOPN
1.8000 mg | PEN_INJECTOR | Freq: Every day | SUBCUTANEOUS | 0 refills | Status: DC
Start: 2022-09-18 — End: 2022-09-27

## 2022-09-18 NOTE — Telephone Encounter (Signed)
Discussed with [employer] manager from [employer], understanding voiced. Rx for victoza sent.

## 2022-09-18 NOTE — Telephone Encounter (Signed)
Sheri Baker with [employer] [facility] called stating pt's insurance covers Trulicity but pt is in the donut hole. Asked if she could be switched to Victoza. Contact # [phone].

## 2022-09-20 LAB — LIPID PANEL
Cholesterol: 148 (ref 0–200)
HDL: 60 (ref 35–70)
LDL Cholesterol: 75
Triglycerides: 66 (ref 40–160)

## 2022-09-20 LAB — TSH: TSH: 0.98 (ref 0.41–5.90)

## 2022-09-27 ENCOUNTER — Ambulatory Visit: Payer: Medicare Other | Admitting: "Endocrinology

## 2022-09-27 ENCOUNTER — Encounter: Payer: Self-pay | Admitting: "Endocrinology

## 2022-09-27 VITALS — BP 124/82 | HR 68 | Ht 65.0 in | Wt 264.8 lb

## 2022-09-27 DIAGNOSIS — E119 Type 2 diabetes mellitus without complications: Secondary | ICD-10-CM

## 2022-09-27 DIAGNOSIS — E039 Hypothyroidism, unspecified: Secondary | ICD-10-CM | POA: Diagnosis not present

## 2022-09-27 DIAGNOSIS — E042 Nontoxic multinodular goiter: Secondary | ICD-10-CM | POA: Diagnosis not present

## 2022-09-27 LAB — POCT GLYCOSYLATED HEMOGLOBIN (HGB A1C): HbA1c, POC (controlled diabetic range): 5.9 % (ref 0.0–7.0)

## 2022-09-27 MED ORDER — BD PEN NEEDLE NANO U/F 32G X 4 MM MISC: 1.0000 | Freq: Four times a day (QID) | 1 refills | Status: AC

## 2022-09-27 NOTE — Progress Notes (Signed)
09/27/2022, 5:35 PM                           Endocrinology follow-up note    Sheri Baker is a 71 y.o.-year-old female patient being seen in follow-up for hypothyroidism, prediabetes.  [id number], Provider, MD.   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 medications   Dysrhythmia    "palpations with full dose of hydrocodone"- takes 1/2 of Hydrocodone- Acetaminophen and does not have palpations   GERD (gastroesophageal reflux disease)    Headache    occasional   Hemorrhoids    History of hiatal hernia    Hypertension    Hypothyroidism    Past Surgical History:  Procedure Laterality Date   ABDOMINAL HYSTERECTOMY     partial -right ovary remains   BACK SURGERY     cataract     CERVIX REMOVAL     COLONOSCOPY     FOOT SURGERY Left    Callus- reaset bone   NASAL SINUS SURGERY     REVERSE SHOULDER ARTHROPLASTY Left 06/29/2021   Procedure: REVERSE SHOULDER ARTHROPLASTY;  Surgeon: Justice Britain, MD;  Location: [facility];  Service: Orthopedics;  Laterality: Left;   TONSILLECTOMY     Social History   Socioeconomic History   Marital status: Single    Spouse name: Not on file   Number of children: Not on file   Years of education: Not on file   Highest education level: Not on file  Occupational History   Not on file  Tobacco Use   Smoking status: Former    Packs/day: 0.25    Years: 15.00    Total pack years: 3.75    Types: Cigarettes   Smokeless tobacco: Former   Tobacco comments:    quit approx age 89 e 31  Vaping Use   Vaping Use: Never used  Substance and Sexual Activity   Alcohol use: Yes    Alcohol/week: 3.0 standard drinks of alcohol    Types: 3 Glasses of wine per week    Comment:  occas.   Drug use: No   Sexual activity: Not on file  Other Topics Concern   Not on file  Social History Narrative   Not on file   Social Determinants of Health   Financial Resource Strain: Not on file  Food Insecurity: Not on file  Transportation Needs: Not on file  Physical Activity: Not on file  Stress: Not on file  Social Connections: Not on file   Outpatient Encounter Medications as of 09/27/2022  Medication Sig  Insulin Pen Needle (BD PEN NEEDLE NANO U/F) 32G X 4 MM MISC 1 each by Does not apply route 4 (four) times daily.   Cholecalciferol (VITAMIN D3 PO) Take 1 tablet by mouth daily.   CRESTOR 20 MG tablet Take 20 mg by mouth at bedtime.   fexofenadine (ALLEGRA) 180 MG tablet Take 180 mg by mouth daily.   FOLIC ACID PO Take 1 tablet by mouth daily.   gabapentin (NEURONTIN) 400 MG capsule Take 400 mg by mouth 3 (three) times daily as needed (pain).   hydrochlorothiazide (HYDRODIURIL) 25 MG tablet Take 25 mg by mouth daily.   levocetirizine (XYZAL) 5 MG tablet Take 5 mg by mouth every evening.   levothyroxine (SYNTHROID) 75 MCG tablet TAKE 1 TABLET BY MOUTH DAILY  BEFORE BREAKFAST   losartan (COZAAR) 100 MG tablet Take 100 mg by mouth daily.   methocarbamol (ROBAXIN) 750 MG tablet Take 1 tablet (750 mg total) by mouth 4 (four) times daily as needed for muscle spasms.   Omega-3 Fatty Acids (OMEGA 3 PO) Take 5 capsules by mouth daily.   omeprazole (PRILOSEC) 40 MG capsule Take 40 mg by mouth daily.   ondansetron (ZOFRAN) 4 MG tablet Take 1 tablet (4 mg total) by mouth every 8 (eight) hours as needed for nausea or vomiting.   POTASSIUM GLUCONATE PO Take 1 tablet by mouth in the morning and at bedtime.   spironolactone (ALDACTONE) 25 MG tablet Take 25 mg by mouth daily.   TRULICITY 1.5 KP/5.4SF SOPN INJECT THE CONTENTS OF ONE PEN  SUBCUTANEOUSLY WEEKLY AS  DIRECTED (Patient not taking: Reported on 09/27/2022)   TURMERIC CURCUMIN PO Take 1 capsule by mouth daily.   [DISCONTINUED]  liraglutide (VICTOZA) 18 MG/3ML SOPN Inject 1.8 mg into the skin daily. (Patient not taking: Reported on 09/27/2022)   No facility-administered encounter medications on file as of 09/27/2022.   ALLERGIES: Allergies  Allergen Reactions   Mango Flavor Hives and Swelling    Swelling of throat   Hydrocodone Palpitations    Pt states this was only with high dose, tolerated and had good relief with low dose   Penicillins Rash    Has patient had a PCN reaction causing immediate rash, facial/tongue/throat swelling, SOB or lightheadedness with hypotension: Yes Has patient had a PCN reaction causing severe rash involving mucus membranes or skin necrosis: No Has patient had a PCN reaction that required hospitalization No Has patient had a PCN reaction occurring within the last 10 years: No If all of the above answers are "NO", then may proceed with Cephalosporin use.    VACCINATION STATUS: Immunization History  Administered Date(s) Administered   Pneumococcal Polysaccharide-23 03/28/2016     HPI    Sheri Baker  is a patient with the above medical history. she was diagnosed with hypothyroidism at approximate age of 40 years with lab work.    She is currently on levothyroxine 75 mcg p.o. daily before breakfast.  She has no new complaints today.  Her previous thyroid function tests are consistent with appropriate replacement.   She denies palpitations, tremors, nor dysphagia.  She is scheduled for shoulder surgery in the next few days. Her presents with steady weight, despite Ozempic.   Her point-of-care A1c is 5.9%, improving from 6.2%. she denies family history of  thyroid disorders.  No family history of thyroid cancer.  No history of  radiation therapy to head or neck.  ROS: Limited as above.   Physical Exam: BP 124/82   Pulse 68  Ht '5\' 5"'$  (1.651 m)   Wt 264 lb 12.8 oz (120.1 kg)   BMI 44.07 kg/m  Wt Readings from Last 3 Encounters:  09/27/22 264 lb 12.8 oz (120.1 kg)   03/28/22 265 lb 12.8 oz (120.6 kg)  09/27/21 280 lb (127 kg)     Physical Exam- Limited  Constitutional:  Body mass index is 44.07 kg/m. , not in acute distress, normal state of mind   CMP ( most recent) CMP     Component Value Date/Time   NA 138 06/16/2021 1109   K 3.7 06/16/2021 1109   CL 102 06/16/2021 1109   CO2 29 06/16/2021 1109   GLUCOSE 106 (H) 06/16/2021 1109   BUN 12 06/16/2021 1109   BUN 10 07/22/2018 0000   CREATININE 0.79 06/16/2021 1109   CALCIUM 9.3 06/16/2021 1109   PROT 5.9 (L) 03/31/2016 0421   ALBUMIN 2.4 (L) 03/31/2016 0421   AST 18 03/31/2016 0421   ALT 18 03/31/2016 0421   ALKPHOS 53 03/31/2016 0421   BILITOT 0.6 03/31/2016 0421   GFRNONAA >60 06/16/2021 1109   GFRAA >60 04/13/2016 0631     Diabetic Labs (most recent): Lab Results  Component Value Date   HGBA1C 5.9 09/27/2022   HGBA1C 6.2 03/28/2022   HGBA1C 6.7 (H) 06/16/2021     Lipid Panel ( most recent) Lipid Panel     Component Value Date/Time   CHOL 148 09/20/2022 0000   TRIG 66 09/20/2022 0000   HDL 60 09/20/2022 0000   LDLCALC 75 09/20/2022 0000       Lab Results  Component Value Date   TSH 0.98 09/20/2022   TSH 0.27 (A) 03/22/2022   TSH 0.26 (A) 09/20/2021   TSH 0.08 (A) 06/20/2021   TSH 0.20 (A) 03/17/2021   TSH 0.21 (A) 12/30/2020   TSH 0.19 (A) 07/29/2020   TSH 0.23 (A) 07/24/2019   TSH 0.04 (A) 03/20/2019   TSH 0.30 (A) 07/22/2018       ASSESSMENT: 1.  Hypothyroidism 2.  Prediabetes 3.  Multiple thyroid nodule  PLAN:   -Her previsit thyroid function tests are consistent with appropriate replacement.  She is advised to continue levothyroxine 75 mcg p.o. daily before breakfast.    - We discussed about the correct intake of her thyroid hormone, on empty stomach at fasting, with water, separated by at least 30 minutes from breakfast and other medications,  and separated by more than 4 hours from calcium, iron, multivitamins, acid reflux medications  (PPIs). -Patient is made aware of the fact that thyroid hormone replacement is needed for life, dose to be adjusted by periodic monitoring of thyroid function tests.    -Given palpable thyroid on physical exam, she underwent thyroid ultrasound showing subcentimeter cystic nodules bilaterally.  Her thyroid is not generally enlarged, consistent with heterogeneous echotexture.  Her A1c is 5.9%.  She is responding to Trulicity.  She will continue to benefit from GLP-1 receptor agonists due to her obesity.  She did have problems securing Trulicity coverage, however she is working with some agency to help her get that.  In the meantime, she does have a supply of Victoza, advised to use Victoza 1.2 mg subcutaneously daily until she obtains coverage for Trulicity 1.5 mg subcutaneously weekly.     Side effects and precautions discussed with her.  - she acknowledges that there is a room for improvement in her food and drink choices. - Suggestion is made for her to avoid simple carbohydrates  from her  diet including Cakes, Sweet Desserts, Ice Cream, Soda (diet and regular), Sweet Tea, Candies, Chips, Cookies, Store Bought Juices, Alcohol in Excess of  1-2 drinks a day, Artificial Sweeteners,  Coffee Creamer, and "Sugar-free" Products, Lemonade. This will help patient to have more stable blood glucose profile and potentially avoid unintended weight gain.  She is advised to continue close follow-up with her PMD.  Return for F/U with Pre-visit Labs, A1c -NV.     I spent 26 minutes in the care of the patient today including review of labs from Thyroid Function, CMP, and other relevant labs ; imaging/biopsy records (current and previous including abstractions from other facilities); face-to-face time discussing  her lab results and symptoms, medications doses, her options of short and long term treatment based on the latest standards of care / guidelines;   and documenting the encounter.  Jaquanda Lindenbaum   participated in the discussions, expressed understanding, and voiced agreement with the above plans.  All questions were answered to her satisfaction. she is encouraged to contact clinic should she have any questions or concerns prior to her return visit.     Glade Lloyd, MD [facility] [address] [place],  [address] Phone: [phone]  Fax: [phone]   09/27/2022, 5:35 PM  This note was partially dictated with voice recognition software. Similar sounding words can be transcribed inadequately or may not  be corrected upon review.

## 2022-09-27 NOTE — Patient Instructions (Signed)
                                     Advice for Weight Management  -For most of us the best way to lose weight is by diet management. Generally speaking, diet management means consuming less calories intentionally which over time brings about progressive weight loss.  This can be achieved more effectively by avoiding ultra processed carbohydrates, processed meats, unhealthy fats.    It is critically important to know your numbers: how much calorie you are consuming and how much calorie you need. More importantly, our carbohydrates sources should be unprocessed naturally occurring  complex starch food items.  It is always important to balance nutrition also by  appropriate intake of proteins (mainly plant-based), healthy fats/oils, plenty of fruits and vegetables.   -The American College of Lifestyle Medicine (ACL M) recommends nutrition derived mostly from Whole Food, Plant Predominant Sources example an apple instead of applesauce or apple pie. Eat Plenty of vegetables, Mushrooms, fruits, Legumes, Whole Grains, Nuts, seeds in lieu of processed meats, processed snacks/pastries red meat, poultry, eggs.  Use only water or unsweetened tea for hydration.  The College also recommends the need to stay away from risky substances including alcohol, smoking; obtaining 7-9 hours of restorative sleep, at least 150 minutes of moderate intensity exercise weekly, importance of healthy social connections, and being mindful of stress and seek help when it is overwhelming.    -Sticking to a routine mealtime to eat 3 meals a day and avoiding unnecessary snacks is shown to have a big role in weight control. Under normal circumstances, the only time we burn stored energy is when we are hungry, so allow  some hunger to take place- hunger means no food between appropriate meal times, only water.  It is not advisable to starve.   -It is better to avoid simple carbohydrates including:  Cakes, Sweet Desserts, Ice Cream, Soda (diet and regular), Sweet Tea, Candies, Chips, Cookies, Store Bought Juices, Alcohol in Excess of  1-2 drinks a day, Lemonade,  Artificial Sweeteners, Doughnuts, Coffee Creamers, "Sugar-free" Products, etc, etc.  This is not a complete list.....    -Consulting with certified diabetes educators is proven to provide you with the most accurate and current information on diet.  Also, you may be  interested in discussing diet options/exchanges , we can schedule a visit with Sheri Baker, RDN, CDE for individualized nutrition education.  -Exercise: If you are able: 30 -60 minutes a day ,4 days a week, or 150 minutes of moderate intensity exercise weekly.    The longer the better if tolerated.  Combine stretch, strength, and aerobic activities.  If you were told in the past that you have high risk for cardiovascular diseases, or if you are currently symptomatic, you may seek evaluation by your heart doctor prior to initiating moderate to intense exercise programs.                                  Additional Care Considerations for Diabetes/Prediabetes   -Diabetes  is a chronic disease.  The most important care consideration is regular follow-up with your diabetes care provider with the goal being avoiding or delaying its complications and to take advantage of advances in medications and technology.  If appropriate actions are taken early enough, type 2 diabetes can even be   reversed.  Seek information from the right source.  - Whole Food, Plant Predominant Nutrition is highly recommended: Eat Plenty of vegetables, Mushrooms, fruits, Legumes, Whole Grains, Nuts, seeds in lieu of processed meats, processed snacks/pastries red meat, poultry, eggs as recommended by American College of  Lifestyle Medicine (ACLM).  -Type 2 diabetes is known to coexist with other important comorbidities such as high blood pressure and high cholesterol.  It is critical to control not only the  diabetes but also the high blood pressure and high cholesterol to minimize and delay the risk of complications including coronary artery disease, stroke, amputations, blindness, etc.  The good news is that this diet recommendation for type 2 diabetes is also very helpful for managing high cholesterol and high blood blood pressure.  - Studies showed that people with diabetes will benefit from a class of medications known as ACE inhibitors and statins.  Unless there are specific reasons not to be on these medications, the standard of care is to consider getting one from these groups of medications at an optimal doses.  These medications are generally considered safe and proven to help protect the heart and the kidneys.    - People with diabetes are encouraged to initiate and maintain regular follow-up with eye doctors, foot doctors, dentists , and if necessary heart and kidney doctors.     - It is highly recommended that people with diabetes quit smoking or stay away from smoking, and get yearly  flu vaccine and pneumonia vaccine at least every 5 years.  See above for additional recommendations on exercise, sleep, stress management , and healthy social connections.      

## 2022-10-29 ENCOUNTER — Other Ambulatory Visit: Payer: Self-pay | Admitting: "Endocrinology

## 2022-10-29 DIAGNOSIS — E119 Type 2 diabetes mellitus without complications: Secondary | ICD-10-CM

## 2022-11-30 ENCOUNTER — Other Ambulatory Visit: Payer: Self-pay | Admitting: Orthopaedic Surgery

## 2022-11-30 DIAGNOSIS — M79672 Pain in left foot: Secondary | ICD-10-CM

## 2022-12-07 ENCOUNTER — Ambulatory Visit
Admission: RE | Admit: 2022-12-07 | Discharge: 2022-12-07 | Disposition: A | Discharge status: Home / Self Care | Payer: Medicare Other | Source: Ambulatory Visit | Attending: Orthopaedic Surgery | Admitting: Orthopaedic Surgery

## 2022-12-07 DIAGNOSIS — M79672 Pain in left foot: Secondary | ICD-10-CM

## 2022-12-20 ENCOUNTER — Other Ambulatory Visit: Payer: Self-pay | Admitting: Obstetrics & Gynecology

## 2022-12-20 DIAGNOSIS — N6320 Unspecified lump in the left breast, unspecified quadrant: Secondary | ICD-10-CM

## 2022-12-21 ENCOUNTER — Other Ambulatory Visit: Payer: Self-pay | Admitting: "Endocrinology

## 2022-12-21 DIAGNOSIS — E039 Hypothyroidism, unspecified: Secondary | ICD-10-CM

## 2023-01-02 ENCOUNTER — Other Ambulatory Visit: Payer: Self-pay | Admitting: "Endocrinology

## 2023-01-02 ENCOUNTER — Telehealth: Payer: Self-pay

## 2023-01-02 MED ORDER — OZEMPIC (0.25 OR 0.5 MG/DOSE) 2 MG/3ML ~~LOC~~ SOPN: 0.2500 mg | PEN_INJECTOR | SUBCUTANEOUS | 2 refills | Status: DC

## 2023-01-02 NOTE — Telephone Encounter (Signed)
Pt called stating trulicity is on backorder, requested a prescription for medication to replace it.

## 2023-01-02 NOTE — Telephone Encounter (Signed)
Discussed with pt that the Rx for ozempic had been sent in to her pharmacy to replace trulicity. Pt voiced understanding.

## 2023-03-27 ENCOUNTER — Encounter: Payer: Self-pay | Admitting: "Endocrinology

## 2023-03-27 LAB — BASIC METABOLIC PANEL
BUN: 15 (ref 4–21)
CO2: 28 — AB (ref 13–22)
Chloride: 106 (ref 99–108)
Creatinine: 0.9 (ref 0.5–1.1)
Glucose: 88
Potassium: 4.3 mEq/L (ref 3.5–5.1)
Sodium: 139 (ref 137–147)

## 2023-03-27 LAB — HEPATIC FUNCTION PANEL
ALT: 19 U/L (ref 7–35)
AST: 13 (ref 13–35)
Alkaline Phosphatase: 99 (ref 25–125)
Bilirubin, Total: 0.6

## 2023-03-27 LAB — COMPREHENSIVE METABOLIC PANEL: Calcium: 93 — AB (ref 8.7–10.7)

## 2023-03-27 LAB — TSH: TSH: 0.86 (ref 0.41–5.90)

## 2023-04-01 ENCOUNTER — Ambulatory Visit: Payer: Medicare Other | Admitting: "Endocrinology

## 2023-04-04 ENCOUNTER — Ambulatory Visit: Payer: Medicare Other | Admitting: "Endocrinology

## 2023-04-04 ENCOUNTER — Encounter: Payer: Self-pay | Admitting: "Endocrinology

## 2023-04-04 VITALS — BP 108/76 | HR 68 | Ht 65.0 in | Wt 260.6 lb

## 2023-04-04 DIAGNOSIS — E039 Hypothyroidism, unspecified: Secondary | ICD-10-CM | POA: Diagnosis not present

## 2023-04-04 DIAGNOSIS — E119 Type 2 diabetes mellitus without complications: Secondary | ICD-10-CM | POA: Diagnosis not present

## 2023-04-04 DIAGNOSIS — Z6841 Body Mass Index (BMI) 40.0 and over, adult: Secondary | ICD-10-CM

## 2023-04-04 DIAGNOSIS — Z7985 Long-term (current) use of injectable non-insulin antidiabetic drugs: Secondary | ICD-10-CM

## 2023-04-04 DIAGNOSIS — E782 Mixed hyperlipidemia: Secondary | ICD-10-CM | POA: Diagnosis not present

## 2023-04-04 DIAGNOSIS — E042 Nontoxic multinodular goiter: Secondary | ICD-10-CM

## 2023-04-04 LAB — POCT GLYCOSYLATED HEMOGLOBIN (HGB A1C): HbA1c, POC (controlled diabetic range): 5.8 % (ref 0.0–7.0)

## 2023-04-04 MED ORDER — OZEMPIC (0.25 OR 0.5 MG/DOSE) 2 MG/3ML ~~LOC~~ SOPN: 0.5000 mg | PEN_INJECTOR | SUBCUTANEOUS | 2 refills | Status: DC

## 2023-04-04 NOTE — Progress Notes (Signed)
04/04/2023, 6:59 PM                           Endocrinology follow-up note    Sheri Baker is a 72 y.o.-year-old female patient being seen in follow-up for hypothyroidism, prediabetes.  [id number], Provider, MD.   Past Medical History:  Diagnosis Date   Anemia    Anxiety    Arthritis    Blood clot in vein    Cancer (HCC)    cervical   Constipation    Diabetes mellitus without complication (HCC)    Type II- not on medications   Dysrhythmia    "palpations with full dose of hydrocodone"- takes 1/2 of Hydrocodone- Acetaminophen and does not have palpations   GERD (gastroesophageal reflux disease)    Headache    occasional   Hemorrhoids    History of hiatal hernia    Hypertension    Hypothyroidism    Past Surgical History:  Procedure Laterality Date   ABDOMINAL HYSTERECTOMY     partial -right ovary remains   BACK SURGERY     cataract     CERVIX REMOVAL     COLONOSCOPY     FOOT SURGERY Left    Callus- reaset bone   NASAL SINUS SURGERY     REVERSE SHOULDER ARTHROPLASTY Left 06/29/2021   Procedure: REVERSE SHOULDER ARTHROPLASTY;  Surgeon: Francena Hanly, MD;  Location: [facility];  Service: Orthopedics;  Laterality: Left;   TONSILLECTOMY     Social History   Socioeconomic History   Marital status: Single    Spouse name: Not on file   Number of children: Not on file   Years of education: Not on file   Highest education level: Not on file  Occupational History   Not on file  Tobacco Use   Smoking status: Former    Packs/day: 0.25    Years: 15.00    Additional pack years: 0.00    Total pack years: 3.75    Types: Cigarettes   Smokeless tobacco: Former   Tobacco comments:    quit approx age 75 e 67  Vaping Use   Vaping Use: Never used  Substance and Sexual Activity   Alcohol use: Yes    Alcohol/week: 3.0 standard drinks of alcohol    Types: 3  Glasses of wine per week    Comment: occas.   Drug use: No   Sexual activity: Not on file  Other Topics Concern   Not on file  Social History Narrative   Not on file   Social Determinants of Health   Financial Resource Strain: Not on file  Food Insecurity: Not on file  Transportation Needs: Not on file  Physical Activity: Not on file  Stress: Not on file  Social Connections: Not on file   Outpatient Encounter Medications  as of 04/04/2023  Medication Sig   Cholecalciferol (VITAMIN D3 PO) Take 1 tablet by mouth daily.   CRESTOR 20 MG tablet Take 20 mg by mouth at bedtime.   fexofenadine (ALLEGRA) 180 MG tablet Take 180 mg by mouth daily.   FOLIC ACID PO Take 1 tablet by mouth daily.   gabapentin (NEURONTIN) 400 MG capsule Take 400 mg by mouth 3 (three) times daily as needed (pain).   hydrochlorothiazide (HYDRODIURIL) 25 MG tablet Take 25 mg by mouth daily.   Insulin Pen Needle (BD PEN NEEDLE NANO U/F) 32G X 4 MM MISC 1 each by Does not apply route 4 (four) times daily.   levocetirizine (XYZAL) 5 MG tablet Take 5 mg by mouth every evening.   levothyroxine (SYNTHROID) 75 MCG tablet TAKE 1 TABLET BY MOUTH DAILY  BEFORE BREAKFAST   losartan (COZAAR) 100 MG tablet Take 100 mg by mouth daily.   methocarbamol (ROBAXIN) 750 MG tablet Take 1 tablet (750 mg total) by mouth 4 (four) times daily as needed for muscle spasms.   Omega-3 Fatty Acids (OMEGA 3 PO) Take 5 capsules by mouth daily.   omeprazole (PRILOSEC) 40 MG capsule Take 40 mg by mouth daily.   ondansetron (ZOFRAN) 4 MG tablet Take 1 tablet (4 mg total) by mouth every 8 (eight) hours as needed for nausea or vomiting.   POTASSIUM GLUCONATE PO Take 1 tablet by mouth in the morning and at bedtime.   Semaglutide,0.25 or 0.5MG /DOS, (OZEMPIC, 0.25 OR 0.5 MG/DOSE,) 2 MG/3ML SOPN Inject 0.5 mg into the skin once a week.   spironolactone (ALDACTONE) 25 MG tablet Take 25 mg by mouth daily.   TURMERIC CURCUMIN PO Take 1 capsule by mouth daily.    [DISCONTINUED] Semaglutide,0.25 or 0.5MG /DOS, (OZEMPIC, 0.25 OR 0.5 MG/DOSE,) 2 MG/3ML SOPN Inject 0.25 mg into the skin once a week.   No facility-administered encounter medications on file as of 04/04/2023.   ALLERGIES: Allergies  Allergen Reactions   Mango Flavor Hives and Swelling    Swelling of throat   Hydrocodone Palpitations    Pt states this was only with high dose, tolerated and had good relief with low dose   Penicillins Rash    Has patient had a PCN reaction causing immediate rash, facial/tongue/throat swelling, SOB or lightheadedness with hypotension: Yes Has patient had a PCN reaction causing severe rash involving mucus membranes or skin necrosis: No Has patient had a PCN reaction that required hospitalization No Has patient had a PCN reaction occurring within the last 10 years: No If all of the above answers are "NO", then may proceed with Cephalosporin use.    VACCINATION STATUS: Immunization History  Administered Date(s) Administered   Pneumococcal Polysaccharide-23 03/28/2016     HPI    Sheri Baker  is a patient with the above medical history. she was diagnosed with hypothyroidism at approximate age of 1 years with lab work.    She is currently on levothyroxine 75 mcg p.o. daily before breakfast.  Her previsit thyroid function tests are consistent with appropriate replacement.  She has no new complaints except the fact that she not satisfied with her weight.  She wishes to achieve more weight loss.   Her presents with steady weight, despite Ozempic 0.25 mg subcutaneously weekly.  She has no side effects from this medication.  Her point-of-care A1c is 5.8%.  she denies family history of  thyroid disorders.  No family history of thyroid cancer.  No history of  radiation therapy to head  or neck.  ROS: Limited as above.   Physical Exam: BP 108/76   Pulse 68   Ht 5\' 5"  (1.651 m)   Wt 260 lb 9.6 oz (118.2 kg)   BMI 43.37 kg/m  Wt Readings from Last 3  Encounters:  04/04/23 260 lb 9.6 oz (118.2 kg)  09/27/22 264 lb 12.8 oz (120.1 kg)  03/28/22 265 lb 12.8 oz (120.6 kg)     Physical Exam- Limited  Constitutional:  Body mass index is 43.37 kg/m. , not in acute distress, normal state of mind   CMP ( most recent) CMP     Component Value Date/Time   NA 139 03/27/2023 0000   K 4.3 03/27/2023 0000   CL 106 03/27/2023 0000   CO2 28 (A) 03/27/2023 0000   GLUCOSE 106 (H) 06/16/2021 1109   BUN 15 03/27/2023 0000   CREATININE 0.9 03/27/2023 0000   CREATININE 0.79 06/16/2021 1109   CALCIUM 93.0 (A) 03/27/2023 0000   PROT 5.9 (L) 03/31/2016 0421   ALBUMIN 2.4 (L) 03/31/2016 0421   AST 13 03/27/2023 0000   ALT 19 03/27/2023 0000   ALKPHOS 99 03/27/2023 0000   BILITOT 0.6 03/31/2016 0421   GFRNONAA >60 06/16/2021 1109   GFRAA >60 04/13/2016 0631     Diabetic Labs (most recent): Lab Results  Component Value Date   HGBA1C 5.8 04/04/2023   HGBA1C 5.9 09/27/2022   HGBA1C 6.2 03/28/2022     Lipid Panel ( most recent) Lipid Panel     Component Value Date/Time   CHOL 148 09/20/2022 0000   TRIG 66 09/20/2022 0000   HDL 60 09/20/2022 0000   LDLCALC 75 09/20/2022 0000       Lab Results  Component Value Date   TSH 0.86 03/27/2023   TSH 0.98 09/20/2022   TSH 0.27 (A) 03/22/2022   TSH 0.26 (A) 09/20/2021   TSH 0.08 (A) 06/20/2021   TSH 0.20 (A) 03/17/2021   TSH 0.21 (A) 12/30/2020   TSH 0.19 (A) 07/29/2020   TSH 0.23 (A) 07/24/2019   TSH 0.04 (A) 03/20/2019       ASSESSMENT: 1.  Hypothyroidism 2.  Prediabetes 3.  Multiple thyroid nodule  4.  Morbid obesity with hyperlipidemia PLAN:   -Her previsit thyroid function tests are consistent with appropriate replacement.  She is advised to continue thyroxine 75 mcg p.o. daily before breakfast.    - We discussed about the correct intake of her thyroid hormone, on empty stomach at fasting, with water, separated by at least 30 minutes from breakfast and other medications,   and separated by more than 4 hours from calcium, iron, multivitamins, acid reflux medications (PPIs). -Patient is made aware of the fact that thyroid hormone replacement is needed for life, dose to be adjusted by periodic monitoring of thyroid function tests.   -  she recently underwent thyroid ultrasound showing subcentimeter cystic nodules bilaterally.  Her thyroid is not generally enlarged, consistent with heterogeneous echotexture.  Her A1c is 5.8%.  She is responding to low-dose Ozempic.  She may benefit from a higher dose.  I discussed and increase her Ozempic to 0.5 mg subcutaneously weekly.   Side effects and precautions discussed with her. In light of her metabolic dysfunction indicated by morbid obesity, hyperlipidemia, hypertension as well as prediabetes, she is a good candidate for lifestyle medicine. Her engagement is not optimal. - she acknowledges that there is a room for improvement in her food and drink choices. - Suggestion is made for her  to avoid simple carbohydrates  from her diet including Cakes, Sweet Desserts, Ice Cream, Soda (diet and regular), Sweet Tea, Candies, Chips, Cookies, Store Bought Juices, Alcohol in Excess of  1-2 drinks a day, Artificial Sweeteners,  Coffee Creamer, and "Sugar-free" Products, Lemonade. This will help patient to have more stable blood glucose profile and potentially avoid unintended weight gain.  She is advised to continue C[place]o 20 mg p.o. nightly along with omega-3 fatty acids.  He is also advised to continue her hydrochlorothiazide 25 mg p.o. daily at breakfast, spironolactone 25 mg p.o. daily at breakfast and losartan 100 mg p.o. daily at breakfast for blood pressure management.  She is advised to continue close follow-up with her PMD.  Return in about 3 months (around 07/05/2023) for Fasting Labs  in AM B4 8.     I spent  26  minutes in the care of the patient today including review of labs from Thyroid Function, CMP, and other relevant  labs ; imaging/biopsy records (current and previous including abstractions from other facilities); face-to-face time discussing  her lab results and symptoms, medications doses, her options of short and long term treatment based on the latest standards of care / guidelines;   and documenting the encounter.  Sheri Baker  participated in the discussions, expressed understanding, and voiced agreement with the above plans.  All questions were answered to her satisfaction. she is encouraged to contact clinic should she have any questions or concerns prior to her return visit.      Marquis Lunch, MD [facility] [address] [place], [place] [address] Phone: [phone]  Fax: [phone]   04/04/2023, 6:59 PM  This note was partially dictated with voice recognition software. Similar sounding words can be transcribed inadequately or may not  be corrected upon review.

## 2023-06-16 ENCOUNTER — Other Ambulatory Visit: Payer: Self-pay | Admitting: "Endocrinology

## 2023-06-23 HISTORY — PX: PELVIC EXENTERATION: SHX738

## 2023-07-02 LAB — LIPID PANEL
Cholesterol: 162 (ref 0–200)
HDL: 58 (ref 35–70)
LDL Cholesterol: 89
Triglycerides: 73 (ref 40–160)

## 2023-07-02 LAB — TSH: TSH: 0.49 (ref 0.41–5.90)

## 2023-07-03 ENCOUNTER — Encounter: Payer: Self-pay | Admitting: "Endocrinology

## 2023-07-08 ENCOUNTER — Ambulatory Visit: Payer: Medicare Other | Admitting: "Endocrinology

## 2023-07-09 ENCOUNTER — Encounter: Payer: Self-pay | Admitting: "Endocrinology

## 2023-07-09 ENCOUNTER — Ambulatory Visit: Payer: Medicare Other | Admitting: "Endocrinology

## 2023-07-09 VITALS — BP 104/76 | HR 80 | Ht 65.0 in | Wt 254.8 lb

## 2023-07-09 DIAGNOSIS — E782 Mixed hyperlipidemia: Secondary | ICD-10-CM | POA: Diagnosis not present

## 2023-07-09 DIAGNOSIS — E039 Hypothyroidism, unspecified: Secondary | ICD-10-CM

## 2023-07-09 DIAGNOSIS — Z7985 Long-term (current) use of injectable non-insulin antidiabetic drugs: Secondary | ICD-10-CM

## 2023-07-09 DIAGNOSIS — E119 Type 2 diabetes mellitus without complications: Secondary | ICD-10-CM

## 2023-07-09 NOTE — Patient Instructions (Signed)

## 2023-07-09 NOTE — Progress Notes (Signed)
07/09/2023, 12:50 PM                           Endocrinology follow-up note    Sheri Baker is a 72 y.o.-year-old female patient being seen in follow-up for hypothyroidism, prediabetes.  [id number], Provider, MD.   Past Medical History:  Diagnosis Date   Anemia    Anxiety    Arthritis    Blood clot in vein    Cancer (HCC)    cervical   Constipation    Diabetes mellitus without complication (HCC)    Type II- not on medications   Dysrhythmia    "palpations with full dose of hydrocodone"- takes 1/2 of Hydrocodone- Acetaminophen and does not have palpations   GERD (gastroesophageal reflux disease)    Headache    occasional   Hemorrhoids    History of hiatal hernia    Hypertension    Hypothyroidism    Past Surgical History:  Procedure Laterality Date   ABDOMINAL HYSTERECTOMY     partial -right ovary remains   BACK SURGERY     cataract     CERVIX REMOVAL     COLONOSCOPY     FOOT SURGERY Left    Callus- reaset bone   NASAL SINUS SURGERY     REVERSE SHOULDER ARTHROPLASTY Left 06/29/2021   Procedure: REVERSE SHOULDER ARTHROPLASTY;  Surgeon: Francena Hanly, MD;  Location: [facility];  Service: Orthopedics;  Laterality: Left;   TONSILLECTOMY     Social History   Socioeconomic History   Marital status: Single    Spouse name: Not on file   Number of children: Not on file   Years of education: Not on file   Highest education level: Not on file  Occupational History   Not on file  Tobacco Use   Smoking status: Former    Current packs/day: 0.25    Average packs/day: 0.3 packs/day for 15.0 years (3.8 ttl pk-yrs)    Types: Cigarettes   Smokeless tobacco: Former   Tobacco comments:    quit approx age 54 e 34  Vaping Use   Vaping status: Never Used  Substance and Sexual Activity   Alcohol use: Yes    Alcohol/week: 3.0 standard drinks of alcohol    Types: 3  Glasses of wine per week    Comment: occas.   Drug use: No   Sexual activity: Not on file  Other Topics Concern   Not on file  Social History Narrative   Not on file   Social Determinants of Health   Financial Resource Strain: Not on file  Food Insecurity: Not on file  Transportation Needs: Not on file  Physical Activity: Not on file  Stress: Not on file  Social Connections: Not on file   Outpatient Encounter Medications as of 07/09/2023  Medication  Sig   Cholecalciferol (VITAMIN D3 PO) Take 1 tablet by mouth daily.   CRESTOR 20 MG tablet Take 20 mg by mouth at bedtime.   fexofenadine (ALLEGRA) 180 MG tablet Take 180 mg by mouth daily.   FOLIC ACID PO Take 1 tablet by mouth daily.   gabapentin (NEURONTIN) 400 MG capsule Take 400 mg by mouth 3 (three) times daily as needed (pain).   hydrochlorothiazide (HYDRODIURIL) 25 MG tablet Take 25 mg by mouth daily.   Insulin Pen Needle (BD PEN NEEDLE NANO U/F) 32G X 4 MM MISC 1 each by Does not apply route 4 (four) times daily.   levocetirizine (XYZAL) 5 MG tablet Take 5 mg by mouth every evening.   levothyroxine (SYNTHROID) 75 MCG tablet TAKE 1 TABLET BY MOUTH DAILY  BEFORE BREAKFAST   losartan (COZAAR) 100 MG tablet Take 100 mg by mouth daily.   methocarbamol (ROBAXIN) 750 MG tablet Take 1 tablet (750 mg total) by mouth 4 (four) times daily as needed for muscle spasms.   Omega-3 Fatty Acids (OMEGA 3 PO) Take 5 capsules by mouth daily.   omeprazole (PRILOSEC) 40 MG capsule Take 40 mg by mouth daily.   ondansetron (ZOFRAN) 4 MG tablet Take 1 tablet (4 mg total) by mouth every 8 (eight) hours as needed for nausea or vomiting.   OZEMPIC, 0.25 OR 0.5 MG/DOSE, 2 MG/3ML SOPN INJECT 0.5 MG SUBCUTANEOUSLY ONCE A WEEK   POTASSIUM GLUCONATE PO Take 1 tablet by mouth in the morning and at bedtime.   spironolactone (ALDACTONE) 25 MG tablet Take 25 mg by mouth daily.   TURMERIC CURCUMIN PO Take 1 capsule by mouth daily.   No facility-administered  encounter medications on file as of 07/09/2023.   ALLERGIES: Allergies  Allergen Reactions   Mango Flavor Hives and Swelling    Swelling of throat   Hydrocodone Palpitations    Pt states this was only with high dose, tolerated and had good relief with low dose   Penicillins Rash    Has patient had a PCN reaction causing immediate rash, facial/tongue/throat swelling, SOB or lightheadedness with hypotension: Yes Has patient had a PCN reaction causing severe rash involving mucus membranes or skin necrosis: No Has patient had a PCN reaction that required hospitalization No Has patient had a PCN reaction occurring within the last 10 years: No If all of the above answers are "NO", then may proceed with Cephalosporin use.    VACCINATION STATUS: Immunization History  Administered Date(s) Administered   Pneumococcal Polysaccharide-23 03/28/2016     HPI    Sheri Baker  is a patient with the above medical history. she was diagnosed with hypothyroidism at approximate age of 66 years with lab work.    She is currently on levothyroxine 75 mcg p.o. daily before breakfast.    Her previsit thyroid function tests are consistent with appropriate replacement.  She has no new complaints except the fact that she not satisfied with her weight.  She comes with 6 pounds of weight loss, wishes to achieve more weight loss.   She is currently on Ozempic 0.5 mg subcutaneously weekly.  She reports some nausea, no vomiting, wishes to stay on it.  Her recent A1c was 5.8%.   she denies family history of  thyroid disorders.  No family history of thyroid cancer.  No history of  radiation therapy to head or neck.  ROS: Limited as above.   Physical Exam: BP 104/76   Pulse 80   Ht 5\' 5"  (  1.651 m)   Wt 254 lb 12.8 oz (115.6 kg)   BMI 42.40 kg/m  Wt Readings from Last 3 Encounters:  07/09/23 254 lb 12.8 oz (115.6 kg)  04/04/23 260 lb 9.6 oz (118.2 kg)  09/27/22 264 lb 12.8 oz (120.1 kg)     Physical  Exam- Limited  Constitutional:  Body mass index is 42.4 kg/m. , not in acute distress, normal state of mind   CMP ( most recent) CMP     Component Value Date/Time   NA 139 03/27/2023 0000   K 4.3 03/27/2023 0000   CL 106 03/27/2023 0000   CO2 28 (A) 03/27/2023 0000   GLUCOSE 106 (H) 06/16/2021 1109   BUN 15 03/27/2023 0000   CREATININE 0.9 03/27/2023 0000   CREATININE 0.79 06/16/2021 1109   CALCIUM 93.0 (A) 03/27/2023 0000   PROT 5.9 (L) 03/31/2016 0421   ALBUMIN 2.4 (L) 03/31/2016 0421   AST 13 03/27/2023 0000   ALT 19 03/27/2023 0000   ALKPHOS 99 03/27/2023 0000   BILITOT 0.6 03/31/2016 0421   GFRNONAA >60 06/16/2021 1109   GFRAA >60 04/13/2016 0631     Diabetic Labs (most recent): Lab Results  Component Value Date   HGBA1C 5.8 04/04/2023   HGBA1C 5.9 09/27/2022   HGBA1C 6.2 03/28/2022     Lipid Panel ( most recent) Lipid Panel     Component Value Date/Time   CHOL 162 07/02/2023 0000   TRIG 73 07/02/2023 0000   HDL 58 07/02/2023 0000   LDLCALC 89 07/02/2023 0000       Lab Results  Component Value Date   TSH 0.49 07/02/2023   TSH 0.86 03/27/2023   TSH 0.98 09/20/2022   TSH 0.27 (A) 03/22/2022   TSH 0.26 (A) 09/20/2021   TSH 0.08 (A) 06/20/2021   TSH 0.20 (A) 03/17/2021   TSH 0.21 (A) 12/30/2020   TSH 0.19 (A) 07/29/2020   TSH 0.23 (A) 07/24/2019       ASSESSMENT: 1.  Hypothyroidism 2.  Prediabetes 3.  Multiple thyroid nodule  4.  Morbid obesity with hyperlipidemia PLAN:   -Her previsit thyroid function tests are consistent with appropriate replacement.  She is advised to continue levothyroxine 75 mcg p.o. daily before breakfast.     - We discussed about the correct intake of her thyroid hormone, on empty stomach at fasting, with water, separated by at least 30 minutes from breakfast and other medications,  and separated by more than 4 hours from calcium, iron, multivitamins, acid reflux medications (PPIs). -Patient is made aware of the fact  that thyroid hormone replacement is needed for life, dose to be adjusted by periodic monitoring of thyroid function tests.    -  she recently underwent thyroid ultrasound showing subcentimeter cystic nodules bilaterally.  Her thyroid is not generally enlarged, consistent with heterogeneous echotexture.  Her recent A1c was 5.8%.  She is tolerating a low-dose of Ozempic, 0.5 mg subcutaneously weekly.    Side effects and precautions discussed with her. In light of her metabolic dysfunction indicated by morbid obesity, hyperlipidemia, hypertension as well as prediabetes, she is a good candidate for lifestyle medicine. Her engagement is not optimal. - she acknowledges that there is a room for improvement in her food and drink choices. - Suggestion is made for her to avoid simple carbohydrates  from her diet including Cakes, Sweet Desserts, Ice Cream, Soda (diet and regular), Sweet Tea, Candies, Chips, Cookies, Store Bought Juices, Alcohol in Excess of  1-2 drinks a  day, Artificial Sweeteners,  Coffee Creamer, and "Sugar-free" Products, Lemonade. This will help patient to have more stable blood glucose profile and potentially avoid unintended weight gain.  She is advised to continue Cresto 20 mg p.o. nightly along with omega-3 fatty acids.  She is advised to continue her hydrochlorothiazide 25 mg p.o. daily at breakfast, spironolactone 25 mg p.o. daily at breakfast and losartan 100 mg p.o. daily at breakfast for blood pressure management.  She is advised to continue close follow-up with her PMD.  Return in about 6 months (around 01/06/2024) for Fasting Labs  in AM B4 8, A1c -NV.     I spent  25  minutes in the care of the patient today including review of labs from CMP, Lipids, Thyroid Function, Hematology (current and previous including abstractions from other facilities); face-to-face time discussing  her blood glucose readings/logs, discussing hypoglycemia and hyperglycemia episodes and symptoms,  medications doses, her options of short and long term treatment based on the latest standards of care / guidelines;  discussion about incorporating lifestyle medicine;  and documenting the encounter. Risk reduction counseling performed per USPSTF guidelines to reduce  obesity and cardiovascular risk factors.     Please refer to Patient Instructions for Blood Glucose Monitoring and Insulin/Medications Dosing Guide"  in media tab for additional information. Please  also refer to " Patient Self Inventory" in the Media  tab for reviewed elements of pertinent patient history.  Sheri Baker participated in the discussions, expressed understanding, and voiced agreement with the above plans.  All questions were answered to her satisfaction. she is encouraged to contact clinic should she have any questions or concerns prior to her return visit.       Marquis Lunch, MD [facility] [address] [place], [place] [address] Phone: [phone]  Fax: [phone]   07/09/2023, 12:50 PM  This note was partially dictated with voice recognition software. Similar sounding words can be transcribed inadequately or may not  be corrected upon review.

## 2023-08-29 ENCOUNTER — Other Ambulatory Visit: Payer: Self-pay | Admitting: "Endocrinology

## 2023-08-29 DIAGNOSIS — E039 Hypothyroidism, unspecified: Secondary | ICD-10-CM

## 2023-08-30 ENCOUNTER — Other Ambulatory Visit: Payer: Self-pay | Admitting: "Endocrinology

## 2023-11-16 ENCOUNTER — Other Ambulatory Visit: Payer: Self-pay | Admitting: "Endocrinology

## 2024-01-01 ENCOUNTER — Telehealth: Payer: Self-pay | Admitting: "Endocrinology

## 2024-01-01 DIAGNOSIS — E042 Nontoxic multinodular goiter: Secondary | ICD-10-CM

## 2024-01-01 DIAGNOSIS — E039 Hypothyroidism, unspecified: Secondary | ICD-10-CM

## 2024-01-01 DIAGNOSIS — E782 Mixed hyperlipidemia: Secondary | ICD-10-CM

## 2024-01-01 DIAGNOSIS — E119 Type 2 diabetes mellitus without complications: Secondary | ICD-10-CM

## 2024-01-01 NOTE — Telephone Encounter (Signed)
 Pt needs labs updated and mailed.

## 2024-01-01 NOTE — Telephone Encounter (Signed)
 Lab orders updated, sent to [facility] and mailed to pt.

## 2024-01-06 ENCOUNTER — Ambulatory Visit: Payer: Medicare Other | Admitting: "Endocrinology

## 2024-01-14 LAB — BASIC METABOLIC PANEL WITH GFR
BUN: 12 (ref 4–21)
Creatinine: 1 (ref 0.5–1.1)

## 2024-01-14 LAB — COMPREHENSIVE METABOLIC PANEL WITH GFR: Calcium: 9.4 (ref 8.7–10.7)

## 2024-01-14 LAB — LIPID PANEL
Cholesterol: 144 (ref 0–200)
HDL: 54 (ref 35–70)
LDL Cholesterol: 73
Triglycerides: 85 (ref 40–160)

## 2024-01-14 LAB — TSH: TSH: 0.54 (ref 0.41–5.90)

## 2024-01-14 LAB — COMPREHENSIVE METABOLIC PANEL
Calcium: 9.4
EGFR: 63

## 2024-01-14 LAB — T4, FREE: Free T4: 1.47 ng/dL

## 2024-01-21 ENCOUNTER — Encounter: Payer: Self-pay | Admitting: "Endocrinology

## 2024-01-21 ENCOUNTER — Ambulatory Visit: Admitting: "Endocrinology

## 2024-01-21 VITALS — BP 100/74 | HR 80 | Ht 65.0 in | Wt 258.2 lb

## 2024-01-21 DIAGNOSIS — E119 Type 2 diabetes mellitus without complications: Secondary | ICD-10-CM

## 2024-01-21 DIAGNOSIS — E782 Mixed hyperlipidemia: Secondary | ICD-10-CM | POA: Diagnosis not present

## 2024-01-21 DIAGNOSIS — E039 Hypothyroidism, unspecified: Secondary | ICD-10-CM | POA: Diagnosis not present

## 2024-01-21 DIAGNOSIS — Z6841 Body Mass Index (BMI) 40.0 and over, adult: Secondary | ICD-10-CM

## 2024-01-21 DIAGNOSIS — Z7985 Long-term (current) use of injectable non-insulin antidiabetic drugs: Secondary | ICD-10-CM

## 2024-01-21 LAB — POCT GLYCOSYLATED HEMOGLOBIN (HGB A1C): HbA1c, POC (controlled diabetic range): 6 % (ref 0.0–7.0)

## 2024-01-21 NOTE — Patient Instructions (Signed)

## 2024-01-21 NOTE — Progress Notes (Signed)
 01/21/2024, 11:38 AM                           Endocrinology follow-up note   Sheri Baker is a 73 y.o.-year-old female patient being seen in follow-up for hypothyroidism, prediabetes.  [id number], Provider, MD.   Past Medical History:  Diagnosis Date   Anemia    Anxiety    Arthritis    Blood clot in vein    Cancer (HCC)    cervical   Constipation    Diabetes mellitus without complication (HCC)    Type II- not on medications   Dysrhythmia    "palpations with full dose of hydrocodone"- takes 1/2 of Hydrocodone- Acetaminophen and does not have palpations   GERD (gastroesophageal reflux disease)    Headache    occasional   Hemorrhoids    History of hiatal hernia    Hypertension    Hypothyroidism    Past Surgical History:  Procedure Laterality Date   ABDOMINAL HYSTERECTOMY     partial -right ovary remains   BACK SURGERY     cataract     CERVIX REMOVAL     COLONOSCOPY     FOOT SURGERY Left    Callus- reaset bone   NASAL SINUS SURGERY     REVERSE SHOULDER ARTHROPLASTY Left 06/29/2021   Procedure: REVERSE SHOULDER ARTHROPLASTY;  Surgeon: Francena Hanly, MD;  Location: [facility];  Service: Orthopedics;  Laterality: Left;   TONSILLECTOMY     Social History   Socioeconomic History   Marital status: Single    Spouse name: Not on file   Number of children: Not on file   Years of education: Not on file   Highest education level: Not on file  Occupational History   Not on file  Tobacco Use   Smoking status: Former    Current packs/day: 0.25    Average packs/day: 0.3 packs/day for 15.0 years (3.8 ttl pk-yrs)    Types: Cigarettes   Smokeless tobacco: Former   Tobacco comments:    quit approx age 15 e 69  Vaping Use   Vaping status: Never Used  Substance and Sexual Activity   Alcohol use: Yes    Alcohol/week: 3.0 standard drinks of alcohol    Types: 3  Glasses of wine per week    Comment: occas.   Drug use: No   Sexual activity: Not on file  Other Topics Concern   Not on file  Social History Narrative   Not on file   Social Drivers of Health   Financial Resource Strain: Not on file  Food Insecurity: Not on file  Transportation Needs: Not on file  Physical Activity: Not on file  Stress: Not on file  Social Connections: Not on file   Outpatient Encounter Medications as of 01/21/2024  Medication Sig  Cholecalciferol (VITAMIN D3 PO) Take 1 tablet by mouth daily.   CRESTOR 20 MG tablet Take 20 mg by mouth at bedtime.   fexofenadine (ALLEGRA) 180 MG tablet Take 180 mg by mouth daily.   FOLIC ACID PO Take 1 tablet by mouth daily.   gabapentin (NEURONTIN) 400 MG capsule Take 400 mg by mouth 3 (three) times daily as needed (pain).   hydrochlorothiazide (HYDRODIURIL) 25 MG tablet Take 25 mg by mouth daily.   Insulin Pen Needle (BD PEN NEEDLE NANO U/F) 32G X 4 MM MISC 1 each by Does not apply route 4 (four) times daily.   levocetirizine (XYZAL) 5 MG tablet Take 5 mg by mouth every evening.   levothyroxine (SYNTHROID) 75 MCG tablet TAKE 1 TABLET BY MOUTH DAILY  BEFORE BREAKFAST   losartan (COZAAR) 100 MG tablet Take 100 mg by mouth daily.   methocarbamol (ROBAXIN) 750 MG tablet Take 1 tablet (750 mg total) by mouth 4 (four) times daily as needed for muscle spasms.   Omega-3 Fatty Acids (OMEGA 3 PO) Take 5 capsules by mouth daily.   omeprazole (PRILOSEC) 40 MG capsule Take 40 mg by mouth daily.   ondansetron (ZOFRAN) 4 MG tablet Take 1 tablet (4 mg total) by mouth every 8 (eight) hours as needed for nausea or vomiting.   POTASSIUM GLUCONATE PO Take 1 tablet by mouth in the morning and at bedtime.   Semaglutide,0.25 or 0.5MG /DOS, (OZEMPIC, 0.25 OR 0.5 MG/DOSE,) 2 MG/3ML SOPN INJECT 0.5 MG SUBCUTANEOUSLY ONCE A WEEK   spironolactone (ALDACTONE) 25 MG tablet Take 25 mg by mouth daily.   TURMERIC CURCUMIN PO Take 1 capsule by mouth daily.   No  facility-administered encounter medications on file as of 01/21/2024.   ALLERGIES: Allergies  Allergen Reactions   Mango Flavoring Agent (Non-Screening) Hives and Swelling    Swelling of throat   Hydrocodone Palpitations    Pt states this was only with high dose, tolerated and had good relief with low dose   Penicillins Rash    Has patient had a PCN reaction causing immediate rash, facial/tongue/throat swelling, SOB or lightheadedness with hypotension: Yes Has patient had a PCN reaction causing severe rash involving mucus membranes or skin necrosis: No Has patient had a PCN reaction that required hospitalization No Has patient had a PCN reaction occurring within the last 10 years: No If all of the above answers are "NO", then may proceed with Cephalosporin use.    VACCINATION STATUS: Immunization History  Administered Date(s) Administered   Pneumococcal Polysaccharide-23 03/28/2016     HPI    Sheri Baker  is a patient with the above medical history. she was diagnosed with hypothyroidism at approximate age of 21 years with lab work.    She is currently on levothyroxine 75 mcg p.o. daily before breakfast.  Her previsit thyroid function tests are consistent with appropriate replacement.   She has no new complaints except the fact that she not satisfied with her weight.  She did not achieve significant weight loss, cannot tolerate a higher dose of her Ozempic.  She stays on Ozempic 0.5 mg subcutaneously weekly.    She reports some nausea, no vomiting, wishes to stay on it.  Her point-of-care A1c today 6%.    she denies family history of  thyroid disorders.  No family history of thyroid cancer.  No history of  radiation therapy to head or neck.  ROS: Limited as above.   Physical Exam: BP 100/74   Pulse 80  Ht 5\' 5"  (1.651 m)   Wt 258 lb 3.2 oz (117.1 kg)   BMI 42.97 kg/m  Wt Readings from Last 3 Encounters:  01/21/24 258 lb 3.2 oz (117.1 kg)  07/09/23 254 lb 12.8 oz (115.6  kg)  04/04/23 260 lb 9.6 oz (118.2 kg)     Physical Exam- Limited  Constitutional:  Body mass index is 42.97 kg/m. , not in acute distress, normal state of mind   CMP ( most recent) CMP     Component Value Date/Time   NA 139 03/27/2023 0000   K 4.3 03/27/2023 0000   CL 106 03/27/2023 0000   CO2 28 (A) 03/27/2023 0000   GLUCOSE 106 (H) 06/16/2021 1109   BUN 12 01/14/2024 0000   CREATININE 1.0 01/14/2024 0000   CREATININE 0.79 06/16/2021 1109   CALCIUM 9.4 01/14/2024 0000   CALCIUM 9.4 01/14/2024 0000   PROT 5.9 (L) 03/31/2016 0421   ALBUMIN 2.4 (L) 03/31/2016 0421   AST 13 03/27/2023 0000   ALT 19 03/27/2023 0000   ALKPHOS 99 03/27/2023 0000   BILITOT 0.6 03/31/2016 0421   GFRNONAA >60 06/16/2021 1109   GFRAA >60 04/13/2016 0631     Diabetic Labs (most recent): Lab Results  Component Value Date   HGBA1C 6.0 01/21/2024   HGBA1C 5.8 04/04/2023   HGBA1C 5.9 09/27/2022     Lipid Panel ( most recent) Lipid Panel     Component Value Date/Time   CHOL 144 01/14/2024 0000   TRIG 85 01/14/2024 0000   HDL 54 01/14/2024 0000   LDLCALC 73 01/14/2024 0000       Lab Results  Component Value Date   TSH 0.54 01/14/2024   TSH 0.49 07/02/2023   TSH 0.86 03/27/2023   TSH 0.98 09/20/2022   TSH 0.27 (A) 03/22/2022   TSH 0.26 (A) 09/20/2021   TSH 0.08 (A) 06/20/2021   TSH 0.20 (A) 03/17/2021   TSH 0.21 (A) 12/30/2020   TSH 0.19 (A) 07/29/2020   FREET4 1.47 01/14/2024       ASSESSMENT: 1.  Hypothyroidism 2.  Type 2 diabetes 3.  Multiple thyroid nodule  4.  Morbid obesity with hyperlipidemia PLAN:   -Her previsit thyroid function tests are consistent with appropriate replacement.  She is advised to continue levothyroxine 75 mcg p.o. daily before breakfast.      - We discussed about the correct intake of her thyroid hormone, on empty stomach at fasting, with water, separated by at least 30 minutes from breakfast and other medications,  and separated by more  than 4 hours from calcium, iron, multivitamins, acid reflux medications (PPIs). -Patient is made aware of the fact that thyroid hormone replacement is needed for life, dose to be adjusted by periodic monitoring of thyroid function tests.  -  she recently underwent thyroid ultrasound showing subcentimeter cystic nodules bilaterally.  Her thyroid is not generally enlarged, consistent with heterogeneous echotexture.  Her point-of-care A1c today 6%.  She is not tolerating a higher dose of Ozempic, however wishes to stay at her current dose of Ozempic at 0.5 mg subcutaneously weekly.    Side effects and precautions discussed with her. In light of her metabolic dysfunction indicated by morbid obesity, hyperlipidemia, hypertension as well as prediabetes, she is a good candidate for lifestyle medicine. Her engagement is not optimal. - she acknowledges that there is a room for improvement in her food and drink choices. - Suggestion is made for her to avoid simple carbohydrates  from her diet  including Cakes, Sweet Desserts, Ice Cream, Soda (diet and regular), Sweet Tea, Candies, Chips, Cookies, Store Bought Juices, Alcohol in Excess of  1-2 drinks a day, Artificial Sweeteners,  Coffee Creamer, and "Sugar-free" Products, Lemonade. This will help patient to have more stable blood glucose profile and potentially avoid unintended weight gain.  Her previsit lipid panel shows controlled LDL at 73.  She is advised to continue Crestor 20 mg p.o. nightly along with omega-3 fatty acids.   She is on hydrochlorothiazide 25 mg p.o. daily at breakfast, spironolactone 25 mg p.o. daily at breakfast and losartan 100 mg p.o. daily at breakfast for blood pressure management.  She is advised to continue close follow-up with her PMD.  Return in about 6 months (around 07/22/2024) for F/U with Pre-visit Labs, A1c -NV.     I spent  26  minutes in the care of the patient today including review of labs from CMP, Lipids, Thyroid  Function, Hematology (current and previous including abstractions from other facilities); face-to-face time discussing  her blood glucose readings/logs, discussing hypoglycemia and hyperglycemia episodes and symptoms, medications doses, her options of short and long term treatment based on the latest standards of care / guidelines;  discussion about incorporating lifestyle medicine;  and documenting the encounter. Risk reduction counseling performed per USPSTF guidelines to reduce  obesity and cardiovascular risk factors.     Please refer to Patient Instructions for Blood Glucose Monitoring and Insulin/Medications Dosing Guide"  in media tab for additional information. Please  also refer to " Patient Self Inventory" in the Media  tab for reviewed elements of pertinent patient history.  Sheri Baker participated in the discussions, expressed understanding, and voiced agreement with the above plans.  All questions were answered to her satisfaction. she is encouraged to contact clinic should she have any questions or concerns prior to her return visit.     Marquis Lunch, MD [facility] [address] [place], [place] [address] Phone: [phone]  Fax: [phone]   01/21/2024, 11:38 AM  This note was partially dictated with voice recognition software. Similar sounding words can be transcribed inadequately or may not  be corrected upon review.

## 2024-02-04 ENCOUNTER — Other Ambulatory Visit: Payer: Self-pay | Admitting: "Endocrinology

## 2024-04-17 ENCOUNTER — Other Ambulatory Visit: Payer: Self-pay | Admitting: "Endocrinology

## 2024-05-07 ENCOUNTER — Other Ambulatory Visit: Payer: Self-pay | Admitting: "Endocrinology

## 2024-05-07 DIAGNOSIS — E039 Hypothyroidism, unspecified: Secondary | ICD-10-CM

## 2024-05-26 ENCOUNTER — Encounter: Payer: Self-pay | Admitting: Internal Medicine

## 2024-05-26 ENCOUNTER — Ambulatory Visit (INDEPENDENT_AMBULATORY_CARE_PROVIDER_SITE_OTHER)

## 2024-05-26 ENCOUNTER — Ambulatory Visit

## 2024-05-26 ENCOUNTER — Ambulatory Visit: Attending: Internal Medicine | Admitting: Internal Medicine

## 2024-05-26 VITALS — BP 118/82 | HR 80 | Resp 16 | Ht 64.0 in | Wt 266.2 lb

## 2024-05-26 DIAGNOSIS — M79642 Pain in left hand: Secondary | ICD-10-CM | POA: Diagnosis not present

## 2024-05-26 DIAGNOSIS — M79641 Pain in right hand: Secondary | ICD-10-CM | POA: Diagnosis not present

## 2024-05-26 DIAGNOSIS — M159 Polyosteoarthritis, unspecified: Secondary | ICD-10-CM

## 2024-05-26 DIAGNOSIS — M797 Fibromyalgia: Secondary | ICD-10-CM | POA: Insufficient documentation

## 2024-05-26 DIAGNOSIS — Z8739 Personal history of other diseases of the musculoskeletal system and connective tissue: Secondary | ICD-10-CM | POA: Diagnosis not present

## 2024-05-26 DIAGNOSIS — M059 Rheumatoid arthritis with rheumatoid factor, unspecified: Secondary | ICD-10-CM | POA: Insufficient documentation

## 2024-05-26 DIAGNOSIS — M25511 Pain in right shoulder: Secondary | ICD-10-CM

## 2024-05-26 NOTE — Progress Notes (Signed)
 Office Visit Note  Patient: Sheri Baker             Date of Birth: 25-Apr-1951           MRN: [id number]             PCP: Default, Provider, MD Referring: Harvey Lynwood DASEN, MD Visit Date: 05/26/2024   Subjective:  New Patient (Initial Visit) (Continuous pain in hands, arms and wrist. Previously DX with RA)    Discussed the use of AI scribe software for clinical note transcription with the patient, who gave verbal consent to proceed.  History of Present Illness   Sheri Baker is a 73 year old female with a history of rheumatoid arthritis who presents with joint pain and stiffness affecting multiple areas.  Previously she saw Dr. Fae with a diagnosis of rheumatoid arthritis that was treated on a maintenance with low-dose oral prednisone and folic acid supplementation.  Does not sound like she was ever on any steroid sparing DMARD treatment. I do not have any records on had to review for specific tests or clinical findings.  She experiences worsening joint pain despite previous treatment with prednisone and folic acid, which she discontinued due to lack of efficacy. She has a history of left shoulder replacement surgery 4 years ago with Dr. Melita. She and currently experiences significant pain in her shoulders, particularly after physical activity, which often requires a day or two of recuperation. She also has pain in her fingers, with noticeable bone growths and deformities at her thumbs more advanced on the left side. She gets pain in her knees and ankles, which swell after prolonged standing.  She describes widespread pain, fatigue, and sleep disturbances. She asks about symptoms attributable to fibromyalgia syndrome, but is not clear if this was a specific previous diagnosis. She experiences cramps in her feet and legs, which sometimes cause her toes to turn inward and require manual manipulation to relieve. She has been experiencing plantar fasciitis for the past four to five years,  with daily pain in her feet and ankles. She uses compression socks and plantar fasciitis inserts for relief. She also has a history of bone spurs in her heels.  Her current medications include gabapentin  400 mg three times a day and methocarbamol  as needed, typically two times a day. She also uses topical Voltaren. She avoids NSAIDs due to previous liver and renal concerns.  She has a history of a fall resulting in a broken toe, which was not surgically treated due to her diabetes. She reports tightness in her calf muscles and hamstrings, and she does not currently engage in any specific stretching exercises. She used to walk but reduced this primarily limited due to leg pain and cramping issues.  No total numbness in her toes but stiffness and occasional cramps. No swelling in her legs but swelling in her knees and ankles after prolonged standing. No recent lab work.       Activities of Daily Living:  Patient reports morning stiffness for 15 minutes.   Patient Denies nocturnal pain.  Difficulty dressing/grooming: Denies Difficulty climbing stairs: Denies Difficulty getting out of chair: Denies Difficulty using hands for taps, buttons, cutlery, and/or writing: Reports  Review of Systems  Constitutional:  Negative for fatigue.  HENT:  Positive for mouth dryness. Negative for mouth sores.   Eyes:  Positive for dryness.  Respiratory:  Negative for shortness of breath.   Cardiovascular:  Positive for palpitations. Negative for chest pain.  Gastrointestinal:  Negative  for blood in stool, constipation and diarrhea.  Endocrine: Negative for increased urination.  Genitourinary:  Positive for involuntary urination.  Musculoskeletal:  Positive for joint pain, gait problem, joint pain, joint swelling, myalgias, muscle weakness, morning stiffness, muscle tenderness and myalgias.  Skin:  Positive for color change and hair loss. Negative for rash and sensitivity to sunlight.  Allergic/Immunologic:  Negative for susceptible to infections.  Neurological:  Negative for dizziness and headaches.  Hematological:  Negative for swollen glands.  Psychiatric/Behavioral:  Negative for depressed mood and sleep disturbance. The patient is not nervous/anxious.     PMFS History:  Patient Active Problem List   Diagnosis Date Noted   History of rheumatoid arthritis 05/26/2024   Generalized osteoarthritis of multiple sites 05/26/2024   Fibromyalgia syndrome 05/26/2024   Mixed hyperlipidemia 07/09/2023   Morbid obesity (HCC) 09/27/2022   Multinodular goiter 01/09/2021   Abnormal thyroid  blood test 10/01/2018   Hypothyroidism 10/01/2018   Diabetes mellitus without complication (HCC) 10/01/2018   Pain    Muscle cramping    Thrombocytopenia (HCC)    Pulmonary embolism without acute cor pulmonale (HCC)    Anxiety state    Chest pain    Pain in rib    Post-operative pain    Neuropathic pain of both legs    Leukocytosis    Hyponatremia    Acute blood loss anemia    Benign essential HTN    Slow transit constipation    Type 2 diabetes mellitus with obesity (HCC)    Spinal stenosis of lumbar region with radiculopathy 03/30/2016   Spondylolisthesis of lumbar region 03/27/2016    Past Medical History:  Diagnosis Date   Anemia    Anxiety    Arthritis    Blood clot in vein    Cancer (HCC)    cervical   Constipation    Diabetes mellitus without complication (HCC)    Type II- not on medications   Dysrhythmia    palpations with full dose of hydrocodone - takes 1/2 of Hydrocodone - Acetaminophen  and does not have palpations   Fibromyalgia 2015   GERD (gastroesophageal reflux disease)    Headache    occasional   Hemorrhoids    History of hiatal hernia    Hypertension    Hypothyroidism    Neuropathy    Plantar fasciitis 2017   Rheumatoid arthritis (HCC) 2015    Family History  Problem Relation Age of Onset   CAD Mother    Hypertension Mother    Hypertension Father    Heart attack  Father        cause of death   Stroke Sister        x3   Diabetes Sister    Hypertension Sister    Arthritis Sister    Arthritis Brother    Hypertension Brother    Prostate cancer Brother    Colon cancer Brother 14   Hypertension Son    Past Surgical History:  Procedure Laterality Date   ABDOMINAL HYSTERECTOMY     partial -right ovary remains   BACK SURGERY     cataract     CERVIX REMOVAL     COLONOSCOPY     FOOT SURGERY Left    Callus- reaset bone   NASAL SINUS SURGERY     PELVIC EXENTERATION  06/2023   cancer cells removed fro her cervix area   REVERSE SHOULDER ARTHROPLASTY Left 06/29/2021   Procedure: REVERSE SHOULDER ARTHROPLASTY;  Surgeon: Melita Drivers, MD;  Location: [facility];  Service:  Orthopedics;  Laterality: Left;   TONSILLECTOMY     Social History   Social History Narrative   Not on file   Immunization History  Administered Date(s) Administered   Moderna Sars-Covid-2 Vaccination 11/21/2019, 12/19/2019, 09/03/2020   Pneumococcal Polysaccharide-23 03/28/2016     Objective: Vital Signs: BP 118/82 (BP Location: Right Arm, Patient Position: Sitting, Cuff Size: Normal)   Pulse 80   Resp 16   Ht 5' 4 (1.626 m)   Wt 266 lb 3.2 oz (120.7 kg)   BMI 45.69 kg/m    Physical Exam Constitutional:      Appearance: She is obese.  Eyes:     Conjunctiva/sclera: Conjunctivae normal.  Cardiovascular:     Rate and Rhythm: Normal rate and regular rhythm.  Pulmonary:     Effort: Pulmonary effort is normal.     Breath sounds: Normal breath sounds.  Skin:    General: Skin is warm and dry.  Neurological:     Mental Status: She is alert.  Psychiatric:        Mood and Affect: Mood normal.      Musculoskeletal Exam:  Nontender hump at base of neck, normal ROM Upper back and upper arm muscle tenderness to pressure without radiation Left shoulder postsurgical changes, active and passive ROM restricted Wrists full ROM no tenderness or swelling Fingers left greater  than right first CMC squaring and MCP H.E.L.P. subluxation partially reducible, mild DIP Heberden's nodes, no palpable synovitis Hips tight in internal and external rotation, no focal tenderness or pain with movement Knees full ROM no tenderness or swelling Ankles slightly limited dorsiflexion, calf tightness MTPs no tenderness or swelling    Investigation: No additional findings.  Imaging: XR Shoulder Right Result Date: 05/26/2024 X-ray right shoulder 4 views Humerus in normal position and internal and external rotation view.  Glenohumeral joint space appears normal does appear to be bone spurring on margins of humerus joint surface. Acromiohumeral distance is normal.  Possible calcifications in subacromial bursa space but no visible effusion.  AC joint appears normal. Impression Mild appearing glenohumeral osteoarthritis, possible subacromial space calcifications could also be consistent with chronic bursitis or rotator cuff arthropathy  XR Hand 2 View Right Result Date: 05/26/2024 X-ray right hand 2 views Radiocarpal joint space appears normal.  There is moderately severe first CMC joint osteoarthritis with joint space loss and subchondral cyst and mild subluxation.  Cystic changes present throughout other carpal bones.  MCP PIP and DIP joints appear normal.  No erosions or abnormal calcifications seen.  Bone mineralization appears normal. Impression Moderate first CMC joint osteoarthritis with cystic changes likely very mild osteoarthritis in wrist and MCP joints  XR Hand 2 View Left Result Date: 05/26/2024 X-ray left hand 2 views Radiocarpal joint space appears normal.  Severe degenerative change of the first [facility] joint with extensive bone spurring and severe subluxation.  MCP PIP and DIP joints appear normal.  No erosions or abnormal calcifications seen.  Bone mineralization appears normal. Impression Severe first CMC joint localized osteoarthritis   Recent Labs: Lab Results  Component  Value Date   WBC 5.0 06/16/2021   HGB 13.7 06/16/2021   PLT 258 06/16/2021   NA 139 03/27/2023   K 4.3 03/27/2023   CL 106 03/27/2023   CO2 28 (A) 03/27/2023   GLUCOSE 106 (H) 06/16/2021   BUN 12 01/14/2024   CREATININE 1.0 01/14/2024   BILITOT 0.6 03/31/2016   ALKPHOS 99 03/27/2023   AST 13 03/27/2023   ALT 19 03/27/2023  PROT 5.9 (L) 03/31/2016   ALBUMIN  2.4 (L) 03/31/2016   CALCIUM  9.4 01/14/2024   CALCIUM  9.4 01/14/2024   GFRAA >60 04/13/2016    Speciality Comments: Patient previously saw Dr. Fae in [place]. Called to request records to be faxed to our office. Phone: [phone]  Procedures:  No procedures performed Allergies: Mango flavoring agent (non-screening), Hydrocodone , and Penicillins   Assessment / Plan:     Visit Diagnoses: History of rheumatoid arthritis - Plan: Sedimentation rate, C-reactive protein, Rheumatoid factor, Cyclic citrul peptide antibody, IgG, C3 and C4, Comprehensive metabolic panel with GFR Worsening joint pain and swelling in shoulders and hands. Previous prednisone treatment ineffective. No recent imaging or labs to assess inflammation.  Clinically I do not see any definite peripheral joint synovitis on exam. - Order x-rays of shoulders and hands today -no erosive disease, mostly severe first CMC joint osteoarthritis does not appear very advanced at other finger joints or in the right shoulder - Order blood tests: rheumatoid factor, CCP antibody, sedimentation rate, C-reactive protein, complement C3 and C4. - Check metabolic panel for kidney and liver function. - Consider whether low-dose NSAIDs are an option after review of her labs versus conventional small molecule DMARD if workup strongly suggestive for RA  Generalized osteoarthritis of multiple sites - Plan: XR Hand 2 View Right, XR Hand 2 View Left, XR Shoulder Right Osteoarthritis of bilateral shoulders, status post left shoulder replacement Right shoulder pain maybe some is due to  compensatory overuse post left shoulder replacement. Limited range of motion from reverse shoulder arthroplasty on left.  Shoulder x-ray also raises concern may be for chronic soft tissue involved on the right may be amenable to trying PT or local injection.  Fibromyalgia syndrome Widespread pain, fatigue, and increased pain sensitivity. Discussed fibromyalgia as a more centrally mediated disorder for which non-pharmacological management is equally or more important than drugs. - Provide educational materials on fibromyalgia, including a website link for non-drug management strategies. - Encouraged trying to resume low impact exercise at a very low intensity, possible recumbent biking, seated yoga, or water exercises  Plantar fasciitis and bone spurs of feet, bilateral Chronic pain from plantar fasciitis and bone spurs, exacerbated by standing and walking. Previous surgery with recurrence. - Provide exercises for plantar fasciitis management. - Discuss benefits of aquatic exercises or recumbent biking.  Tight bilateral gastrocnemius muscles No stretching regimen in place. - Provide stretching exercises for lower leg muscles.  Possible peripheral neuropathy On gabapentin  400 mg 3 times daily.  No significant sensory deficit.    Orders: Orders Placed This Encounter  Procedures   XR Hand 2 View Right   XR Hand 2 View Left   XR Shoulder Right   Sedimentation rate   C-reactive protein   Rheumatoid factor   Cyclic citrul peptide antibody, IgG   C3 and C4   Comprehensive metabolic panel with GFR   No orders of the defined types were placed in this encounter.    Follow-Up Instructions: No follow-ups on file.   Lonni LELON Ester, MD  Note - This record has been created using [employer].  Chart creation errors have been sought, but may not always  have been located. Such creation errors do not reflect on  the standard of medical care.

## 2024-05-26 NOTE — Patient Instructions (Signed)
 I recommend checking out the [place] [facility] [place] patient-centered guide for fibromyalgia and chronic pain management: [email or web address]

## 2024-05-28 LAB — COMPREHENSIVE METABOLIC PANEL WITH GFR
AG Ratio: 1.2 (calc) (ref 1.0–2.5)
ALT: 11 U/L (ref 6–29)
AST: 10 U/L (ref 10–35)
Albumin: 4.2 g/dL (ref 3.6–5.1)
Alkaline phosphatase (APISO): 90 U/L (ref 37–153)
BUN: 10 mg/dL (ref 7–25)
CO2: 25 mmol/L (ref 20–32)
Calcium: 9.6 mg/dL (ref 8.6–10.4)
Chloride: 104 mmol/L (ref 98–110)
Creat: 0.92 mg/dL (ref 0.60–1.00)
Globulin: 3.5 g/dL (ref 1.9–3.7)
Glucose, Bld: 97 mg/dL (ref 65–99)
Potassium: 4.3 mmol/L (ref 3.5–5.3)
Sodium: 138 mmol/L (ref 135–146)
Total Bilirubin: 0.5 mg/dL (ref 0.2–1.2)
Total Protein: 7.7 g/dL (ref 6.1–8.1)
eGFR: 66 mL/min/1.73m2 (ref >=60)

## 2024-05-28 LAB — C-REACTIVE PROTEIN: CRP: <3 mg/L (ref <8.0)

## 2024-05-28 LAB — RHEUMATOID FACTOR: Rheumatoid fact SerPl-aCnc: 22 [IU]/mL — ABNORMAL HIGH (ref <14)

## 2024-05-28 LAB — C3 AND C4
C3 Complement: 183 mg/dL (ref 83–193)
C4 Complement: 30 mg/dL (ref 15–57)

## 2024-05-28 LAB — SEDIMENTATION RATE: Sed Rate: 33 mm/h — ABNORMAL HIGH (ref 0–30)

## 2024-05-28 LAB — CYCLIC CITRUL PEPTIDE ANTIBODY, IGG: Cyclic Citrullin Peptide Ab: <16 U

## 2024-06-02 ENCOUNTER — Other Ambulatory Visit: Payer: Self-pay | Admitting: "Endocrinology

## 2024-06-03 ENCOUNTER — Telehealth: Payer: Self-pay

## 2024-06-03 NOTE — Telephone Encounter (Signed)
 Patient called the office to check her Xray results from visit. Please advise

## 2024-06-23 ENCOUNTER — Telehealth: Payer: Self-pay | Admitting: Internal Medicine

## 2024-06-23 NOTE — Telephone Encounter (Signed)
 Patient Sheri Baker and is requesting someone to contact her about her lab results.

## 2024-06-23 NOTE — Telephone Encounter (Signed)
 Patient called the office to check on labs and see what she should do next? Please advise.

## 2024-06-26 ENCOUNTER — Ambulatory Visit: Payer: Self-pay | Admitting: Internal Medicine

## 2024-06-26 NOTE — Telephone Encounter (Signed)
 Now addressed in associated result note

## 2024-06-26 NOTE — Progress Notes (Signed)
 Rheumatoid factor was positive at a low level of 22.  Sedimentation rate was also mildly increased at 33 which could indicate some inflammation.  Her other lab tests were all normal.  Hand x-rays showed severe osteoarthritis at the base of her thumb with some mild wear and tear elsewhere.  The shoulder x-ray was more suggestive for rotator cuff tendinitis or bursitis problems. I think we should schedule a follow-up appointment whenever available to review these and potential treatments.

## 2024-06-29 ENCOUNTER — Other Ambulatory Visit: Payer: Self-pay | Admitting: "Endocrinology

## 2024-06-29 DIAGNOSIS — E039 Hypothyroidism, unspecified: Secondary | ICD-10-CM

## 2024-07-21 LAB — LAB REPORT - SCANNED
Free T4: 1.2 ng/dL
TSH: 0.51 (ref 0.41–5.90)

## 2024-07-22 DIAGNOSIS — Z77090 Contact with and (suspected) exposure to asbestos: Secondary | ICD-10-CM

## 2024-07-22 HISTORY — DX: Contact with and (suspected) exposure to asbestos: Z77.090

## 2024-07-23 ENCOUNTER — Ambulatory Visit: Admitting: "Endocrinology

## 2024-07-23 ENCOUNTER — Encounter: Payer: Self-pay | Admitting: "Endocrinology

## 2024-07-23 VITALS — BP 96/64 | HR 88 | Ht 64.0 in | Wt 265.8 lb

## 2024-07-23 DIAGNOSIS — E039 Hypothyroidism, unspecified: Secondary | ICD-10-CM | POA: Diagnosis not present

## 2024-07-23 DIAGNOSIS — Z6841 Body Mass Index (BMI) 40.0 and over, adult: Secondary | ICD-10-CM

## 2024-07-23 DIAGNOSIS — E782 Mixed hyperlipidemia: Secondary | ICD-10-CM | POA: Diagnosis not present

## 2024-07-23 DIAGNOSIS — E119 Type 2 diabetes mellitus without complications: Secondary | ICD-10-CM

## 2024-07-23 DIAGNOSIS — Z7985 Long-term (current) use of injectable non-insulin antidiabetic drugs: Secondary | ICD-10-CM

## 2024-07-23 LAB — POCT GLYCOSYLATED HEMOGLOBIN (HGB A1C): HbA1c, POC (controlled diabetic range): 5.9 % (ref 0.0–7.0)

## 2024-07-23 LAB — POCT UA - MICROALBUMIN
Albumin/Creatinine Ratio, Urine, POC: <30
Creatinine, POC: 300 mg/dL
Microalbumin Ur, POC: 30 mg/L

## 2024-07-23 MED ORDER — SEMAGLUTIDE (1 MG/DOSE) 4 MG/3ML ~~LOC~~ SOPN: 1.0000 mg | PEN_INJECTOR | SUBCUTANEOUS | 1 refills | Status: AC

## 2024-07-23 NOTE — Patient Instructions (Signed)

## 2024-07-23 NOTE — Progress Notes (Signed)
 07/23/2024, 12:14 PM                           Endocrinology follow-up note   Sheri Baker is a 73 y.o.-year-old female patient being seen in follow-up for hypothyroidism, prediabetes.  ERE:Izqjlou, Provider, MD.   Past Medical History:  Diagnosis Date   Anemia    Anxiety    Arthritis    Blood clot in vein    Cancer (HCC)    cervical   Constipation    Diabetes mellitus without complication (HCC)    Type II- not on medications   Dysrhythmia    palpations with full dose of hydrocodone - takes 1/2 of Hydrocodone - Acetaminophen  and does not have palpations   Fibromyalgia 2015   GERD (gastroesophageal reflux disease)    Headache    occasional   Hemorrhoids    History of hiatal hernia    Hypertension    Hypothyroidism    Neuropathy    Plantar fasciitis 2017   Rheumatoid arthritis (HCC) 2015   Past Surgical History:  Procedure Laterality Date   ABDOMINAL HYSTERECTOMY     partial -right ovary remains   BACK SURGERY     cataract     CERVIX REMOVAL     COLONOSCOPY     FOOT SURGERY Left    Callus- reaset bone   NASAL SINUS SURGERY     PELVIC EXENTERATION  06/2023   cancer cells removed fro her cervix area   REVERSE SHOULDER ARTHROPLASTY Left 06/29/2021   Procedure: REVERSE SHOULDER ARTHROPLASTY;  Surgeon: Melita Drivers, MD;  Location: [facility];  Service: Orthopedics;  Laterality: Left;   TONSILLECTOMY     Social History   Socioeconomic History   Marital status: Single    Spouse name: Not on file   Number of children: Not on file   Years of education: Not on file   Highest education level: Not on file  Occupational History   Not on file  Tobacco Use   Smoking status: Former    Current packs/day: 0.25    Average packs/day: 0.3 packs/day for 15.0 years (3.8 ttl pk-yrs)    Types: Cigarettes    Passive exposure: Never   Smokeless tobacco: Never    Tobacco comments:    quit approx age 3 e 46  Vaping Use   Vaping status: Never Used  Substance and Sexual Activity   Alcohol use: Not Currently    Comment: occas. maybe 3 a month   Drug use: No   Sexual activity: Not on file  Other Topics Concern   Not on file  Social History Narrative   Not on file   Social Drivers of Health   Financial Resource Strain: Not on file  Food Insecurity: Not on file  Transportation Needs: Not on file  Physical  Activity: Not on file  Stress: Not on file  Social Connections: Not on file   Outpatient Encounter Medications as of 07/23/2024  Medication Sig   Semaglutide , 1 MG/DOSE, 4 MG/3ML SOPN Inject 1 mg as directed once a week.   benzonatate (TESSALON) 200 MG capsule as needed.   Cholecalciferol (VITAMIN D3 PO) Take 1 tablet by mouth daily.   Collagen-Vitamin C-Biotin (COLLAGEN PO) daily.   CRESTOR  20 MG tablet Take 20 mg by mouth at bedtime.   cyclobenzaprine (FLEXERIL) 10 MG tablet as needed.   diclofenac Sodium (VOLTAREN) 1 % GEL 2-3 fingertipsful (1-2 grams) applied topically to affected area(s) up to 3x daily as needed for pain/inflammation for 90 days   diphenhydrAMINE  (BENADRYL  ALLERGY) 25 MG tablet 1 tab(s) orally every 6 hours   erythromycin ophthalmic ointment SMARTSIG:0.25 Strip(s) In Eye(s) Every Night   fexofenadine (ALLEGRA) 180 MG tablet Take 180 mg by mouth daily.   FOLIC ACID PO Take 1 tablet by mouth daily.   gabapentin  (NEURONTIN ) 400 MG capsule Take 400 mg by mouth 3 (three) times daily as needed (pain).   hydrochlorothiazide  (HYDRODIURIL ) 25 MG tablet Take 25 mg by mouth daily.   Insulin  Pen Needle (BD PEN NEEDLE NANO U/F) 32G X 4 MM MISC 1 each by Does not apply route 4 (four) times daily. (Patient not taking: Reported on 05/26/2024)   levocetirizine (XYZAL) 5 MG tablet Take 5 mg by mouth every evening.   levothyroxine  (SYNTHROID ) 75 MCG tablet TAKE 1 TABLET BY MOUTH DAILY  BEFORE BREAKFAST   losartan  (COZAAR ) 100 MG tablet  Take 100 mg by mouth daily.   Magnesium  250 MG CAPS Take 400 mEq by mouth.   meclizine (ANTIVERT) 25 MG tablet as needed.   methocarbamol  (ROBAXIN ) 750 MG tablet Take 1 tablet (750 mg total) by mouth 4 (four) times daily as needed for muscle spasms.   Omega-3 Fatty Acids (OMEGA 3 PO) Take 5 capsules by mouth daily.   omeprazole  (PRILOSEC ) 40 MG capsule Take 40 mg by mouth daily.   ondansetron  (ZOFRAN ) 4 MG tablet Take 1 tablet (4 mg total) by mouth every 8 (eight) hours as needed for nausea or vomiting.   OXYGEN as needed.   Polyethyl Glycol-Propyl Glycol (SYSTANE) 0.4-0.3 % GEL ophthalmic gel Systane( 0.4-0.3% Ophthalmic   ) Active -Hx Entry Ophthalmic; Duration: 0   POTASSIUM GLUCONATE PO Take 1 tablet by mouth in the morning and at bedtime.   spironolactone  (ALDACTONE ) 25 MG tablet Take 25 mg by mouth daily.   triamcinolone (NASACORT ALLERGY 24HR) 55 MCG/ACT AERO nasal inhaler as needed.   TURMERIC CURCUMIN PO Take 1 capsule by mouth daily.   [DISCONTINUED] OZEMPIC , 0.25 OR 0.5 MG/DOSE, 2 MG/3ML SOPN INJECT 0.5 MG SUBCUTANEOUSLY ONCE A WEEK   No facility-administered encounter medications on file as of 07/23/2024.   ALLERGIES: Allergies  Allergen Reactions   Mango Flavoring Agent (Non-Screening) Hives and Swelling    Swelling of throat   Hydrocodone  Palpitations    Pt states this was only with high dose, tolerated and had good relief with low dose   Penicillins Rash    Has patient had a PCN reaction causing immediate rash, facial/tongue/throat swelling, SOB or lightheadedness with hypotension: Yes Has patient had a PCN reaction causing severe rash involving mucus membranes or skin necrosis: No Has patient had a PCN reaction that required hospitalization No Has patient had a PCN reaction occurring within the last 10 years: No If all of the above answers are NO, then  may proceed with Cephalosporin use.    VACCINATION STATUS: Immunization History  Administered Date(s) Administered    Moderna Sars-Covid-2 Vaccination 11/21/2019, 12/19/2019, 09/03/2020   Pneumococcal Polysaccharide-23 03/28/2016     HPI   Sheri Baker  is a patient with the above medical history. she was diagnosed with hypothyroidism at approximate age of 73 years with lab work.    She is currently on levothyroxine  75 mcg p.o. daily before breakfast.  Her previsit thyroid  function test are consistent with appropriate replacement.      She has no new complaints except the fact that she not satisfied with her weight.  She presents with further weight gain.  Her point-of-care A1c is 5.9%.  She remains on Ozempic  0.5 mg subcutaneously weekly.  She does not report any  new side effects on this dose.   she denies family history of  thyroid  disorders.  No family history of thyroid  cancer.  No history of  radiation therapy to head or neck.  ROS: Limited as above.   Physical Exam: BP 96/64   Pulse 88   Ht 5' 4 (1.626 m)   Wt 265 lb 12.8 oz (120.6 kg)   BMI 45.62 kg/m  Wt Readings from Last 3 Encounters:  07/23/24 265 lb 12.8 oz (120.6 kg)  05/26/24 266 lb 3.2 oz (120.7 kg)  01/21/24 258 lb 3.2 oz (117.1 kg)     Physical Exam- Limited  Constitutional:  Body mass index is 45.62 kg/m. , not in acute distress, normal state of mind   CMP ( most recent) CMP     Component Value Date/Time   NA 138 05/26/2024 1035   NA 139 03/27/2023 0000   K 4.3 05/26/2024 1035   CL 104 05/26/2024 1035   CO2 25 05/26/2024 1035   GLUCOSE 97 05/26/2024 1035   BUN 10 05/26/2024 1035   BUN 12 01/14/2024 0000   CREATININE 0.92 05/26/2024 1035   CALCIUM  9.6 05/26/2024 1035   CALCIUM  9.4 01/14/2024 0000   PROT 7.7 05/26/2024 1035   ALBUMIN  2.4 (L) 03/31/2016 0421   AST 10 05/26/2024 1035   ALT 11 05/26/2024 1035   ALKPHOS 99 03/27/2023 0000   BILITOT 0.5 05/26/2024 1035   GFRNONAA >60 06/16/2021 1109   GFRAA >60 04/13/2016 0631     Diabetic Labs (most recent): Lab Results  Component Value Date    HGBA1C 5.9 07/23/2024   HGBA1C 6.0 01/21/2024   HGBA1C 5.8 04/04/2023   MICROALBUR 30 07/23/2024     Lipid Panel ( most recent) Lipid Panel     Component Value Date/Time   CHOL 144 01/14/2024 0000   TRIG 85 01/14/2024 0000   HDL 54 01/14/2024 0000   LDLCALC 73 01/14/2024 0000       Lab Results  Component Value Date   TSH 0.51 07/21/2024   TSH 0.54 01/14/2024   TSH 0.49 07/02/2023   TSH 0.86 03/27/2023   TSH 0.98 09/20/2022   TSH 0.27 (A) 03/22/2022   TSH 0.26 (A) 09/20/2021   TSH 0.08 (A) 06/20/2021   TSH 0.20 (A) 03/17/2021   TSH 0.21 (A) 12/30/2020   FREET4 1.20 07/21/2024   FREET4 1.47 01/14/2024       ASSESSMENT: 1.  Hypothyroidism 2.  Type 2 diabetes 3.  Multiple thyroid  nodule  4.  Morbid obesity with hyperlipidemia PLAN:   -Her previsit thyroid  function tests are consistent with appropriate replacement.  She is advised to continue levothyroxine  75 mcg p.o. daily before breakfast    -  We discussed about the correct intake of her thyroid  hormone, on empty stomach at fasting, with water, separated by at least 30 minutes from breakfast and other medications,  and separated by more than 4 hours from calcium , iron , multivitamins, acid reflux medications (PPIs). -Patient is made aware of the fact that thyroid  hormone replacement is needed for life, dose to be adjusted by periodic monitoring of thyroid  function tests.   -  she recently underwent thyroid  ultrasound showing subcentimeter cystic nodules bilaterally.  Her thyroid  is not generally enlarged, consistent with heterogeneous echotexture.  Her point-of-care A1c today 5.9%.  She does not achieve any weight loss, presents with further weight gain.  I approach her to try a higher dose Ozempic  and she agrees.  I discussed and increased her Ozempic  to 1 mg subcutaneously weekly.  Side effects and precautions discussed with her.     Side effects and precautions discussed with her. In light of her metabolic  dysfunction indicated by morbid obesity, hyperlipidemia, hypertension as well as prediabetes, she is a good candidate for lifestyle medicine. Her engagement is not optimal. - she acknowledges that there is a room for improvement in her food and drink choices. - Suggestion is made for her to avoid simple carbohydrates  from her diet including Cakes, Sweet Desserts, Ice Cream, Soda (diet and regular), Sweet Tea, Candies, Chips, Cookies, Store Bought Juices, Alcohol in Excess of  1-2 drinks a day, Artificial Sweeteners,  Coffee Creamer, and Sugar-free Products, Lemonade. This will help patient to have more stable blood glucose profile and potentially avoid unintended weight gain.   Her recent labs show controlled LDL at 73. She is advised to continue Crestor  20 mg p.o. nightly along with omega-3 fatty acids.   She is on hydrochlorothiazide  25 mg p.o. daily at breakfast, spironolactone  25 mg p.o. daily at breakfast and losartan  100 mg p.o. daily at breakfast for blood pressure management.  She is advised to continue close follow-up with her PMD.  Return in about 6 months (around 01/21/2025) for F/U with Pre-visit Labs.    I spent  26  minutes in the care of the patient today including review of labs from Thyroid  Function, CMP, and other relevant labs ; imaging/biopsy records (current and previous including abstractions from other facilities); face-to-face time discussing  her lab results and symptoms, medications doses, her options of short and long term treatment based on the latest standards of care / guidelines;   and documenting the encounter.  Kenasia Lumley  participated in the discussions, expressed understanding, and voiced agreement with the above plans.  All questions were answered to her satisfaction. she is encouraged to contact clinic should she have any questions or concerns prior to her return visit.    Ranny Earl, MD [facility] [address] [place], [place] [address] Phone: [phone]  Fax: [phone]   07/23/2024, 12:14 PM  This note was partially dictated with voice recognition software. Similar sounding words can be transcribed inadequately or may not  be corrected upon review.

## 2024-08-24 ENCOUNTER — Other Ambulatory Visit: Payer: Self-pay | Admitting: "Endocrinology

## 2024-08-24 DIAGNOSIS — E1169 Type 2 diabetes mellitus with other specified complication: Secondary | ICD-10-CM

## 2024-09-08 ENCOUNTER — Encounter: Payer: Self-pay | Admitting: Internal Medicine

## 2024-09-08 ENCOUNTER — Ambulatory Visit: Attending: Internal Medicine | Admitting: Internal Medicine

## 2024-09-08 VITALS — BP 117/80 | HR 80 | Temp 97.7°F | Resp 16 | Ht 64.0 in | Wt 261.6 lb

## 2024-09-08 DIAGNOSIS — M797 Fibromyalgia: Secondary | ICD-10-CM | POA: Diagnosis not present

## 2024-09-08 DIAGNOSIS — M159 Polyosteoarthritis, unspecified: Secondary | ICD-10-CM

## 2024-09-08 DIAGNOSIS — E119 Type 2 diabetes mellitus without complications: Secondary | ICD-10-CM | POA: Diagnosis not present

## 2024-09-08 DIAGNOSIS — M059 Rheumatoid arthritis with rheumatoid factor, unspecified: Secondary | ICD-10-CM

## 2024-09-08 DIAGNOSIS — M7521 Bicipital tendinitis, right shoulder: Secondary | ICD-10-CM

## 2024-09-08 MED ORDER — HYDROXYCHLOROQUINE SULFATE 200 MG PO TABS: 400.0000 mg | ORAL_TABLET | Freq: Every day | ORAL | 2 refills | Status: DC

## 2024-09-08 NOTE — Patient Instructions (Signed)
 SABRA

## 2024-09-08 NOTE — Progress Notes (Signed)
 Office Visit Note  Patient: Sheri Baker             Date of Birth: 1951/09/26           MRN: [id number]             PCP: Default, Provider, MD Referring: No ref. provider found Visit Date: 09/08/2024   Subjective:  Medication Management (Lab results and next steps for treatment)    Discussed the use of AI scribe software for clinical note transcription with the patient, who gave verbal consent to proceed.  History of Present Illness   Sheri Baker is a 73 year old female with rheumatoid arthritis who presents with joint pain and stiffness. Her blood tests showed a positive rheumatoid factor and elevated sedimentation rate, consistent with rheumatoid arthritis.   She experiences ongoing joint pain and stiffness, particularly in her right shoulder, which is severe. She previously received an injection in the shoulder, which provided relief for about a week. She is hesitant to undergo another surgery, especially after having shoulder replacement surgery on the opposite shoulder due to muscle deterioration. The current shoulder pain has worsened due to overuse after the surgery on the other shoulder.  She has a history of back surgery with six rods placed eight years ago. Recently, she experienced increased back pain and was diagnosed with spondylolisthesis and degenerative discs. She completed twelve weeks of physical therapy, which helped alleviate the pain.  She also reports issues with her thumbs, particularly the left thumb, which has severe degenerative arthritis causing significant pain and misalignment. The pain extends from the thumb down to the wrist, indicating tendon involvement.   Previous HPI 05/26/24 Sheri Baker is a 73 year old female with a history of rheumatoid arthritis who presents with joint pain and stiffness affecting multiple areas.   Previously she saw Dr. Fae with a diagnosis of rheumatoid arthritis that was treated on a maintenance with low-dose oral  prednisone and folic acid supplementation.  Does not sound like she was ever on any steroid sparing DMARD treatment. I do not have any records on had to review for specific tests or clinical findings.   She experiences worsening joint pain despite previous treatment with prednisone and folic acid, which she discontinued due to lack of efficacy. She has a history of left shoulder replacement surgery 4 years ago with Dr. Melita. She and currently experiences significant pain in her shoulders, particularly after physical activity, which often requires a day or two of recuperation. She also has pain in her fingers, with noticeable bone growths and deformities at her thumbs more advanced on the left side. She gets pain in her knees and ankles, which swell after prolonged standing.   She describes widespread pain, fatigue, and sleep disturbances. She asks about symptoms attributable to fibromyalgia syndrome, but is not clear if this was a specific previous diagnosis. She experiences cramps in her feet and legs, which sometimes cause her toes to turn inward and require manual manipulation to relieve. She has been experiencing plantar fasciitis for the past four to five years, with daily pain in her feet and ankles. She uses compression socks and plantar fasciitis inserts for relief. She also has a history of bone spurs in her heels.   Her current medications include gabapentin  400 mg three times a day and methocarbamol  as needed, typically two times a day. She also uses topical Voltaren. She avoids NSAIDs due to previous liver and renal concerns.   She has a history  of a fall resulting in a broken toe, which was not surgically treated due to her diabetes. She reports tightness in her calf muscles and hamstrings, and she does not currently engage in any specific stretching exercises. She used to walk but reduced this primarily limited due to leg pain and cramping issues.   No total numbness in her toes but  stiffness and occasional cramps. No swelling in her legs but swelling in her knees and ankles after prolonged standing. No recent lab work.    Review of Systems  Constitutional:  Negative for fatigue.  HENT:  Positive for mouth dryness. Negative for mouth sores.   Eyes:  Positive for dryness.  Respiratory:  Negative for shortness of breath.   Cardiovascular:  Positive for chest pain. Negative for palpitations.  Gastrointestinal:  Negative for blood in stool, constipation and diarrhea.  Endocrine: Negative for increased urination.  Genitourinary:  Negative for involuntary urination.  Musculoskeletal:  Positive for joint pain, joint pain, joint swelling, myalgias, muscle weakness, morning stiffness, muscle tenderness and myalgias. Negative for gait problem.  Skin:  Negative for color change, rash, hair loss and sensitivity to sunlight.  Allergic/Immunologic: Negative for susceptible to infections.  Neurological:  Negative for dizziness and headaches.  Hematological:  Negative for swollen glands.  Psychiatric/Behavioral:  Positive for sleep disturbance. Negative for depressed mood. The patient is not nervous/anxious.     PMFS History:  Patient Active Problem List   Diagnosis Date Noted   Biceps tendinitis of right shoulder 09/08/2024   Seropositive rheumatoid arthritis (HCC) 05/26/2024   Generalized osteoarthritis of multiple sites 05/26/2024   Fibromyalgia syndrome 05/26/2024   Mixed hyperlipidemia 07/09/2023   Morbid obesity (HCC) 09/27/2022   Multinodular goiter 01/09/2021   Abnormal thyroid  blood test 10/01/2018   Hypothyroidism 10/01/2018   Diabetes mellitus without complication (HCC) 10/01/2018   Pain    Muscle cramping    Thrombocytopenia    Pulmonary embolism without acute cor pulmonale (HCC)    Anxiety state    Chest pain    Pain in rib    Post-operative pain    Neuropathic pain of both legs    Leukocytosis    Hyponatremia    Acute blood loss anemia    Benign  essential HTN    Slow transit constipation    Type 2 diabetes mellitus with obesity    Spinal stenosis of lumbar region with radiculopathy 03/30/2016   Spondylolisthesis of lumbar region 03/27/2016    Past Medical History:  Diagnosis Date   Anemia    Anxiety    Arthritis    Asbestos exposure 07/2024   Blood clot in vein    Cancer (HCC)    cervical   Constipation    Diabetes mellitus without complication (HCC)    Type II- not on medications   Dysrhythmia    palpations with full dose of hydrocodone - takes 1/2 of Hydrocodone - Acetaminophen  and does not have palpations   Fibromyalgia 2015   GERD (gastroesophageal reflux disease)    Headache    occasional   Hemorrhoids    History of hiatal hernia    Hypertension    Hypothyroidism    Neuropathy    Plantar fasciitis 2017   Rheumatoid arthritis (HCC) 2015    Family History  Problem Relation Age of Onset   CAD Mother    Hypertension Mother    Hypertension Father    Heart attack Father        cause of death   Stroke Sister  x3   Diabetes Sister    Hypertension Sister    Arthritis Sister    Arthritis Brother    Hypertension Brother    Prostate cancer Brother    Colon cancer Brother 41   Hypertension Son    Past Surgical History:  Procedure Laterality Date   ABDOMINAL HYSTERECTOMY     partial -right ovary remains   BACK SURGERY     cataract     CERVIX REMOVAL     COLONOSCOPY     FOOT SURGERY Left    Callus- reaset bone   NASAL SINUS SURGERY     PELVIC EXENTERATION  06/2023   cancer cells removed fro her cervix area   REVERSE SHOULDER ARTHROPLASTY Left 06/29/2021   Procedure: REVERSE SHOULDER ARTHROPLASTY;  Surgeon: Melita Drivers, MD;  Location: [facility];  Service: Orthopedics;  Laterality: Left;   TONSILLECTOMY     Social History   Social History Narrative   Not on file   Immunization History  Administered Date(s) Administered   Moderna Sars-Covid-2 Vaccination 11/21/2019, 12/19/2019, 09/03/2020    Pneumococcal Polysaccharide-23 03/28/2016     Objective: Vital Signs: BP 117/80   Pulse 80   Temp 97.7 F (36.5 C)   Resp 16   Ht 5' 4 (1.626 m)   Wt 261 lb 9.6 oz (118.7 kg)   BMI 44.90 kg/m    Physical Exam Constitutional:      Appearance: She is obese.  Eyes:     Conjunctiva/sclera: Conjunctivae normal.  Cardiovascular:     Rate and Rhythm: Normal rate and regular rhythm.  Pulmonary:     Effort: Pulmonary effort is normal.     Breath sounds: Normal breath sounds.  Lymphadenopathy:     Cervical: No cervical adenopathy.  Skin:    General: Skin is warm and dry.  Neurological:     Mental Status: She is alert.  Psychiatric:        Mood and Affect: Mood normal.      Musculoskeletal Exam:  Right shoulder very painful at biceps tendon, no palpable swelling or nodule Left shoulder postsurgical changes, active and passive ROM restricted Wrists full ROM no tenderness or swelling Fingers left greater than right first CMC squaring and 1st MCP subluxation partially reducible, mild DIP Heberden's nodes, no palpable synovitis Hips tight in internal and external rotation, no focal tenderness or pain with movement Knees full ROM no tenderness or swelling MTPs no tenderness or swelling  Investigation: No additional findings.  Imaging: No results found.  Recent Labs: Lab Results  Component Value Date   WBC 5.0 06/16/2021   HGB 13.7 06/16/2021   PLT 258 06/16/2021   NA 138 05/26/2024   K 4.3 05/26/2024   CL 104 05/26/2024   CO2 25 05/26/2024   GLUCOSE 97 05/26/2024   BUN 10 05/26/2024   CREATININE 0.92 05/26/2024   BILITOT 0.5 05/26/2024   ALKPHOS 99 03/27/2023   AST 10 05/26/2024   ALT 11 05/26/2024   PROT 7.7 05/26/2024   ALBUMIN  2.4 (L) 03/31/2016   CALCIUM  9.6 05/26/2024   GFRAA >60 04/13/2016    Speciality Comments: Patient previously saw Dr. Fae in [place]. Called to request records to be faxed to our office. Phone: [phone]  Procedures:  Large  Joint Inj: R subacromial bursa on 09/08/2024 12:35 PM Indications: pain Details: 27 G 1.5 in needle, lateral approach Medications: 2 mL lidocaine  1 %; 40 mg triamcinolone acetonide 40 MG/ML Outcome: tolerated well, no immediate complications Procedure, treatment alternatives, risks  and benefits explained, specific risks discussed. Consent was given by the patient. Immediately prior to procedure a time out was called to verify the correct patient, procedure, equipment, support staff and site/side marked as required. Patient was prepped and draped in the usual sterile fashion.     Allergies: Crab (diagnostic), Mango flavoring agent (non-screening), Hydrocodone , and Penicillins   Assessment / Plan:     Visit Diagnoses: Seropositive rheumatoid arthritis (HCC) - Plan: hydroxychloroquine  (PLAQUENIL ) 200 MG tablet, Large Joint Inj: R subacromial bursa Positive rheumatoid factor and elevated sedimentation rate confirm rheumatoid arthritis. Blood tests indicate inflammation. - Prescribed hydroxychloroquine  (Plaquenil ) 400 mg daily. - Re-evaluate in 2-2.5 months to assess symptom improvement and recheck inflammation markers.  Right shoulder bicipital tendinitis and possible rotator cuff injury Biceps tendinitis of right shoulder - Plan: Large Joint Inj: R subacromial bursa Right shoulder pain likely due to bicipital tendinitis and possible rotator cuff injury. X-ray shows no significant osteoarthritis. - Administered steroid injection in the right shoulder. - Provided rehabilitation exercises for bicipital tendinitis. - Consider MRI if symptoms persist without relief few weeks after injection.     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 Consider hand surgery consultation if remaining as symptomatic but not top priority today.     Orders: Orders Placed This Encounter  Procedures    Large Joint Inj: R subacromial bursa   Meds ordered this encounter  Medications   hydroxychloroquine  (PLAQUENIL ) 200 MG tablet    Sig: Take 2 tablets (400 mg total) by mouth daily. Can take 1 tablet for first week    Dispense:  60 tablet    Refill:  2     Follow-Up Instructions: Return in about 9 weeks (around 11/10/2024) for RA HCQ/inj f/u 8-10wks.   Lonni LELON Ester, MD  Note - This record has been created using [employer].  Chart creation errors have been sought, but may not always  have been located. Such creation errors do not reflect on  the standard of medical care.

## 2024-09-20 MED ORDER — TRIAMCINOLONE ACETONIDE 40 MG/ML IJ SUSP
40.0000 mg | INTRAMUSCULAR | Status: AC | PRN
  Administered 2024-09-08: 40 mg via INTRA_ARTICULAR

## 2024-09-20 MED ORDER — LIDOCAINE HCL 1 % IJ SOLN
2.0000 mL | INTRAMUSCULAR | Status: AC | PRN
  Administered 2024-09-08: 2 mL

## 2024-10-28 NOTE — Progress Notes (Signed)
 "  Office Visit Note  Patient: Sheri Baker             Date of Birth: 11-20-1950           MRN: [id number]             PCP: Default, Provider, MD Referring: No ref. provider found Visit Date: 11/10/2024   Subjective:  Medication Management (Discuss the use of seamoss ) Discussed the use of AI scribe software for clinical note transcription with the patient, who gave verbal consent to proceed.  History of Present Illness   Sheri Baker is a 74 year old female with seropositive rheumatoid arthritis now on hydroxychloroquine  400 mg daily. She presents with new onset back pain, and persistent right shoulder pain despite injection last time.  She has been experiencing new onset back pain since last Thursday, located under the ribs and more pronounced on the left side. Initially, the pain was exacerbated by breathing but has since improved, allowing her to breathe without discomfort. She describes the pain as 'sore' and notes it is a new experience, having previously only experienced similar pain in the front, not the back. She has tried various home remedies, suspecting it might be gas, but is uncertain as she has never experienced it in this location before. 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 She takes methocarbamol  daily for muscle relaxation and finds it effective.   She received a shoulder injection that provided relief for about a week, but the pain has since returned, particularly when lifting or carrying objects without bending her elbow. No major swelling or flare-ups recently, except occasional swelling in the shoulder area.  She also experiences plantar fasciitis and neuropathy in her feet, for which she takes magnesium  supplements daily, noting a reduction in leg cramping. She is on methocarbamol  and gabapentin , which she finds essential for daily functioning.  She  experiences nausea from Ozempic , which she takes with the expectation of a day of nausea. She has an upcoming ophthalmologist appointment for a baseline eye exam due to her hydroxychloroquine  use.       Previous HPI 09/08/2024 Sheri Baker is a 74 year old female with rheumatoid arthritis who presents with joint pain and stiffness. Her blood tests showed a positive rheumatoid factor and elevated sedimentation rate, consistent with rheumatoid arthritis.    She experiences ongoing joint pain and stiffness, particularly in her right shoulder, which is severe. She previously received an injection in the shoulder, which provided relief for about a week. She is hesitant to undergo another surgery, especially after having shoulder replacement surgery on the opposite shoulder due to muscle deterioration. The current shoulder pain has worsened due to overuse after the surgery on the other shoulder.   She has a history of back surgery with six rods placed eight years ago. Recently, she experienced increased back pain and was diagnosed with spondylolisthesis and degenerative discs. She completed twelve weeks of physical therapy, which helped alleviate the pain.   She also reports issues with her thumbs, particularly the left thumb, which has severe degenerative arthritis causing significant pain and misalignment. The pain extends from the thumb down to the wrist, indicating tendon involvement.     Previous HPI 05/26/24 Sheri Baker is a 74 year old female with a history of rheumatoid arthritis who presents with joint pain and stiffness affecting multiple areas.   Previously she  saw Dr. Fae with a diagnosis of rheumatoid arthritis that was treated on a maintenance with low-dose oral prednisone and folic acid supplementation.  Does not sound like she was ever on any steroid sparing DMARD treatment. I do not have any records on had to review for specific tests or clinical findings.   She experiences  worsening joint pain despite previous treatment with prednisone and folic acid, which she discontinued due to lack of efficacy. She has a history of left shoulder replacement surgery 4 years ago with Dr. Melita. She and currently experiences significant pain in her shoulders, particularly after physical activity, which often requires a day or two of recuperation. She also has pain in her fingers, with noticeable bone growths and deformities at her thumbs more advanced on the left side. She gets pain in her knees and ankles, which swell after prolonged standing.   She describes widespread pain, fatigue, and sleep disturbances. She asks about symptoms attributable to fibromyalgia syndrome, but is not clear if this was a specific previous diagnosis. She experiences cramps in her feet and legs, which sometimes cause her toes to turn inward and require manual manipulation to relieve. She has been experiencing plantar fasciitis for the past four to five years, with daily pain in her feet and ankles. She uses compression socks and plantar fasciitis inserts for relief. She also has a history of bone spurs in her heels.   Her current medications include gabapentin  400 mg three times a day and methocarbamol  as needed, typically two times a day. She also uses topical Voltaren. She avoids NSAIDs due to previous liver and renal concerns.   She has a history of a fall resulting in a broken toe, which was not surgically treated due to her diabetes. She reports tightness in her calf muscles and hamstrings, and she does not currently engage in any specific stretching exercises. She used to walk but reduced this primarily limited due to leg pain and cramping issues.   No total numbness in her toes but stiffness and occasional cramps. No swelling in her legs but swelling in her knees and ankles after prolonged standing. No recent lab work.    Review of Systems  Constitutional:  Negative for fatigue.  HENT:  Positive for  mouth dryness. Negative for mouth sores.   Eyes:  Positive for dryness.  Respiratory:  Negative for shortness of breath.   Cardiovascular:  Negative for chest pain and palpitations.  Gastrointestinal:  Positive for constipation. Negative for blood in stool and diarrhea.  Endocrine: Negative for increased urination.  Genitourinary:  Positive for involuntary urination.  Musculoskeletal:  Positive for joint pain, joint pain, myalgias, morning stiffness, muscle tenderness and myalgias. Negative for gait problem, joint swelling and muscle weakness.  Skin:  Negative for color change, rash, hair loss and sensitivity to sunlight.  Allergic/Immunologic: Negative for susceptible to infections.  Neurological:  Positive for dizziness and headaches.  Hematological:  Negative for swollen glands.  Psychiatric/Behavioral:  Negative for depressed mood and sleep disturbance. The patient is not nervous/anxious.     PMFS History:  Patient Active Problem List   Diagnosis Date Noted   Plantar fasciitis, bilateral 11/10/2024   Biceps tendinitis of right shoulder 09/08/2024   Seropositive rheumatoid arthritis (HCC) 05/26/2024   Generalized osteoarthritis of multiple sites 05/26/2024   Fibromyalgia syndrome 05/26/2024   Mixed hyperlipidemia 07/09/2023   Morbid obesity (HCC) 09/27/2022   Multinodular goiter 01/09/2021   Abnormal thyroid  blood test 10/01/2018   Hypothyroidism 10/01/2018  Diabetes mellitus without complication (HCC) 10/01/2018   Pain    Muscle cramping    Thrombocytopenia    Pulmonary embolism without acute cor pulmonale (HCC)    Anxiety state    Chest pain    Pain in rib    Post-operative pain    Neuropathic pain of both legs    Leukocytosis    Hyponatremia    Acute blood loss anemia    Benign essential HTN    Slow transit constipation    Type 2 diabetes mellitus with obesity    Spinal stenosis of lumbar region with radiculopathy 03/30/2016   Spondylolisthesis of lumbar region  03/27/2016    Past Medical History:  Diagnosis Date   Anemia    Anxiety    Arthritis    Asbestos exposure 07/2024   Blood clot in vein    Cancer (HCC)    cervical   Constipation    Diabetes mellitus without complication (HCC)    Type II- not on medications   Dysrhythmia    palpations with full dose of hydrocodone - takes 1/2 of Hydrocodone - Acetaminophen  and does not have palpations   Fibromyalgia 2015   GERD (gastroesophageal reflux disease)    Headache    occasional   Hemorrhoids    History of hiatal hernia    Hypertension    Hypothyroidism    Neuropathy    Plantar fasciitis 2017   Rheumatoid arthritis (HCC) 2015    Family History  Problem Relation Age of Onset   CAD Mother    Hypertension Mother    Hypertension Father    Heart attack Father        cause of death   Stroke Sister        x3   Diabetes Sister    Hypertension Sister    Arthritis Sister    Arthritis Brother    Hypertension Brother    Prostate cancer Brother    Colon cancer Brother 59   Hypertension Son    Past Surgical History:  Procedure Laterality Date   ABDOMINAL HYSTERECTOMY     partial -right ovary remains   BACK SURGERY     cataract     CERVIX REMOVAL     COLONOSCOPY     FOOT SURGERY Left    Callus- reaset bone   NASAL SINUS SURGERY     PELVIC EXENTERATION  06/2023   cancer cells removed fro her cervix area   REVERSE SHOULDER ARTHROPLASTY Left 06/29/2021   Procedure: REVERSE SHOULDER ARTHROPLASTY;  Surgeon: Melita Drivers, MD;  Location: [facility];  Service: Orthopedics;  Laterality: Left;   TONSILLECTOMY     Social History   Social History Narrative   Not on file   Immunization History  Administered Date(s) Administered   Pneumococcal Polysaccharide-23 03/28/2016     Objective: Vital Signs: BP 106/64   Pulse 88   Temp (!) 97.5 F (36.4 C)   Resp 16   Ht 5' 4 (1.626 m)   Wt 259 lb 4 oz (117.6 kg)   BMI 44.50 kg/m    Physical Exam Constitutional:      Appearance:  She is obese.  Eyes:     Conjunctiva/sclera: Conjunctivae normal.  Cardiovascular:     Rate and Rhythm: Normal rate and regular rhythm.  Pulmonary:     Effort: Pulmonary effort is normal.     Breath sounds: Normal breath sounds.  Lymphadenopathy:     Cervical: No cervical adenopathy.  Skin:    General: Skin is  warm and dry.     Findings: No rash.  Neurological:     Mental Status: She is alert.  Psychiatric:        Mood and Affect: Mood normal.      Musculoskeletal Exam:  Right shoulder very painful at biceps tendon, no palpable swelling or nodule Tenderness alnong medial and inferior border of scapulae b/l R>L Left shoulder postsurgical changes, active and passive ROM restricted Wrists full ROM no tenderness or swelling Fingers L>R 1st CMC squaring and 1st MCP subluxation partially reducible, mild DIP Heberden's nodes, no palpable synovitis Hips tight in internal and external rotation, no focal tenderness or pain with movement Knees full ROM no tenderness or swelling MTPs no tenderness or swelling   Investigation: No additional findings.  Imaging: No results found.  Recent Labs: Lab Results  Component Value Date   WBC 5.0 06/16/2021   HGB 13.7 06/16/2021   PLT 258 06/16/2021   NA 138 05/26/2024   K 4.3 05/26/2024   CL 104 05/26/2024   CO2 25 05/26/2024   GLUCOSE 97 05/26/2024   BUN 10 05/26/2024   CREATININE 0.92 05/26/2024   BILITOT 0.5 05/26/2024   ALKPHOS 99 03/27/2023   AST 10 05/26/2024   ALT 11 05/26/2024   PROT 7.7 05/26/2024   ALBUMIN  2.4 (L) 03/31/2016   CALCIUM  9.6 05/26/2024   GFRAA >60 04/13/2016    Speciality Comments: Patient previously saw Dr. Fae in [place]. Called to request records to be faxed to our office. Phone: [phone]  Procedures:  No procedures performed Allergies: Crab (diagnostic), Mango flavoring agent (non-screening), Hydrocodone , and Penicillins   Assessment / Plan:     Visit Diagnoses: Seropositive rheumatoid  arthritis (HCC) - Plan: Sedimentation rate, CBC with Differential/Platelet, Basic Metabolic Panel (BMET), hydroxychloroquine  (PLAQUENIL ) 200 MG tablet Positive rheumatoid factor and elevated sedimentation rate. Hydroxychloroquine  initiated with improvement, but with no frank synovitis at baseline hard to gauge clinical response besides subjective (good) and repeat labs today. No major flare-ups or significant swelling. Nausea seems more attributed to Ozempic . - Rechecked sedimentation rate to assess inflammation control. - Continue hydroxychloroquine  400 mg daily. - Ensure ophthalmology follow-up on January 29th for baseline eye exam due to hydroxychloroquine  use.  Fibromyalgia syndrome Shoulder osteoarthritis Chronic shoulder pain exacerbated by lifting. Previous injection provided short-term relief. Pain manageable with certain movements. Pain under shoulder blades, worsens with breathing, likely muscular. Some relief with stretching exercises. Discussed use of supplements including sea moss which I recommended should be safe though limited data for efficacy available. - Continue methocarbamol  and gabapentin  for pain management. - Provided handout with exercises for back strengthening and stretching. - Encouraged exercises to strengthen shoulder blades and stretch chest muscles.  Plantar fasciitis Chronic pain. Calf cramping partially improved after increasing magnesium  supplementation. - Continue magnesium  supplementation.    Orders: Orders Placed This Encounter  Procedures   Sedimentation rate   CBC with Differential/Platelet   Basic Metabolic Panel (BMET)   Meds ordered this encounter  Medications   hydroxychloroquine  (PLAQUENIL ) 200 MG tablet    Sig: Take 2 tablets (400 mg total) by mouth daily.    Dispense:  180 tablet    Refill:  1     Follow-Up Instructions: Return in about 6 months (around 05/10/2025) for RA on HCQ f/u 6mos.   Lonni LELON Ester, MD  Note - This  record has been created using [employer].  Chart creation errors have been sought, but may not always  have been located. Such creation errors  do not reflect on  the standard of medical care. "

## 2024-11-02 ENCOUNTER — Other Ambulatory Visit: Payer: Self-pay

## 2024-11-02 DIAGNOSIS — M059 Rheumatoid arthritis with rheumatoid factor, unspecified: Secondary | ICD-10-CM

## 2024-11-02 MED ORDER — HYDROXYCHLOROQUINE SULFATE 200 MG PO TABS: 400.0000 mg | ORAL_TABLET | Freq: Every day | ORAL | 0 refills | Status: DC

## 2024-11-02 NOTE — Telephone Encounter (Signed)
 Last Fill: 09/08/2024  Eye exam: None on file    Labs: 05/26/2024 Rheumatoid factor was positive at a low level of 22.  Sedimentation rate was also mildly increased at 33 which could indicate some inflammation.  Her other lab tests were all normal.  Hand x-rays showed severe osteoarthritis at the base of her thumb with some mild wear and tear elsewhere.  The shoulder x-ray was more suggestive for rotator cuff tendinitis or bursitis problems. I think we should schedule a follow-up appointment whenever available to review these and potential treatments.  Next Visit: 11/10/2024  Last Visit: 09/08/2024  DX: Seropositive rheumatoid arthritis   Current Dose per office note 09/08/2024: hydroxychloroquine  (Plaquenil ) 400 mg daily   Okay to refill Plaquenil ?   LMOM to advise that the eye exam is needing to be updated as well as labs.

## 2024-11-10 ENCOUNTER — Ambulatory Visit: Attending: Internal Medicine | Admitting: Internal Medicine

## 2024-11-10 ENCOUNTER — Encounter: Payer: Self-pay | Admitting: Internal Medicine

## 2024-11-10 VITALS — BP 106/64 | HR 88 | Temp 97.5°F | Resp 16 | Ht 64.0 in | Wt 259.2 lb

## 2024-11-10 DIAGNOSIS — M797 Fibromyalgia: Secondary | ICD-10-CM | POA: Diagnosis not present

## 2024-11-10 DIAGNOSIS — M059 Rheumatoid arthritis with rheumatoid factor, unspecified: Secondary | ICD-10-CM | POA: Diagnosis not present

## 2024-11-10 DIAGNOSIS — M722 Plantar fascial fibromatosis: Secondary | ICD-10-CM | POA: Diagnosis not present

## 2024-11-10 MED ORDER — HYDROXYCHLOROQUINE SULFATE 200 MG PO TABS: 400.0000 mg | ORAL_TABLET | Freq: Every day | ORAL | 1 refills | Status: AC

## 2024-11-11 LAB — CBC WITH DIFFERENTIAL/PLATELET
Absolute Lymphocytes: 2927 {cells}/uL (ref 850–3900)
Absolute Monocytes: 464 {cells}/uL (ref 200–950)
Basophils Absolute: 20 {cells}/uL (ref 0–200)
Basophils Relative: 0.4 %
Eosinophils Absolute: 51 {cells}/uL (ref 15–500)
Eosinophils Relative: 1 %
HCT: 45.6 % (ref 35.9–46.0)
Hemoglobin: 14.3 g/dL (ref 11.7–15.5)
MCH: 27.4 pg (ref 27.0–33.0)
MCHC: 31.4 g/dL — ABNORMAL LOW (ref 31.6–35.4)
MCV: 87.4 fL (ref 81.4–101.7)
MPV: 9.9 fL (ref 7.5–12.5)
Monocytes Relative: 9.1 %
Neutro Abs: 1637 {cells}/uL (ref 1500–7800)
Neutrophils Relative %: 32.1 %
Platelets: 261 Thousand/uL (ref 140–400)
RBC: 5.22 Million/uL — ABNORMAL HIGH (ref 3.80–5.10)
RDW: 13.3 % (ref 11.0–15.0)
Total Lymphocyte: 57.4 %
WBC: 5.1 Thousand/uL (ref 3.8–10.8)

## 2024-11-11 LAB — BASIC METABOLIC PANEL WITH GFR
BUN: 13 mg/dL (ref 7–25)
CO2: 28 mmol/L (ref 20–32)
Calcium: 9.6 mg/dL (ref 8.6–10.4)
Chloride: 102 mmol/L (ref 98–110)
Creat: 0.92 mg/dL (ref 0.60–1.00)
Glucose, Bld: 88 mg/dL (ref 65–99)
Potassium: 4.1 mmol/L (ref 3.5–5.3)
Sodium: 138 mmol/L (ref 135–146)
eGFR: 65 mL/min/1.73m2

## 2024-11-11 LAB — SEDIMENTATION RATE: Sed Rate: 14 mm/h (ref 0–30)

## 2025-01-22 ENCOUNTER — Ambulatory Visit: Admitting: "Endocrinology

## 2025-05-10 ENCOUNTER — Ambulatory Visit: Admitting: Internal Medicine
